# Patient Record
Sex: Male | Born: 1965
Health system: Southern US, Community
[De-identification: ages and names within clinical notes are randomized; demographics above are authoritative.]

## PROBLEM LIST (undated history)

## (undated) DIAGNOSIS — F209 Schizophrenia, unspecified: Secondary | ICD-10-CM

## (undated) DIAGNOSIS — G4730 Sleep apnea, unspecified: Secondary | ICD-10-CM

## (undated) DIAGNOSIS — E1165 Type 2 diabetes mellitus with hyperglycemia: Secondary | ICD-10-CM

## (undated) DIAGNOSIS — N529 Male erectile dysfunction, unspecified: Secondary | ICD-10-CM

## (undated) DIAGNOSIS — I10 Essential (primary) hypertension: Secondary | ICD-10-CM

## (undated) DIAGNOSIS — E785 Hyperlipidemia, unspecified: Secondary | ICD-10-CM

## (undated) DIAGNOSIS — F419 Anxiety disorder, unspecified: Secondary | ICD-10-CM

## (undated) HISTORY — DX: Sleep apnea, unspecified: G47.30

## (undated) HISTORY — DX: Male erectile dysfunction, unspecified: N52.9

## (undated) HISTORY — DX: Type 2 diabetes mellitus with hyperglycemia: E11.65

## (undated) HISTORY — DX: Morbid (severe) obesity due to excess calories: E66.01

## (undated) HISTORY — DX: Hyperlipidemia, unspecified: E78.5

## (undated) HISTORY — DX: Schizophrenia, unspecified: F20.9

## (undated) HISTORY — PX: APPENDECTOMY: SHX54

## (undated) HISTORY — DX: Anxiety disorder, unspecified: F41.9

## (undated) HISTORY — DX: Essential (primary) hypertension: I10

## (undated) HISTORY — PX: ANKLE SURGERY: SHX546

---

## 1999-12-16 ENCOUNTER — Emergency Department (HOSPITAL_COMMUNITY): Admission: EM | Admit: 1999-12-16 | Discharge: 1999-12-16 | Payer: Self-pay | Admitting: Emergency Medicine

## 1999-12-22 ENCOUNTER — Encounter: Admission: RE | Admit: 1999-12-22 | Discharge: 1999-12-22 | Payer: Self-pay | Admitting: Internal Medicine

## 1999-12-22 ENCOUNTER — Emergency Department (HOSPITAL_COMMUNITY): Admission: EM | Admit: 1999-12-22 | Discharge: 1999-12-22 | Payer: Self-pay | Admitting: Emergency Medicine

## 1999-12-26 ENCOUNTER — Emergency Department (HOSPITAL_COMMUNITY): Admission: EM | Admit: 1999-12-26 | Discharge: 1999-12-26 | Payer: Self-pay | Admitting: Emergency Medicine

## 1999-12-31 ENCOUNTER — Emergency Department (HOSPITAL_COMMUNITY): Admission: EM | Admit: 1999-12-31 | Discharge: 1999-12-31 | Payer: Self-pay

## 2000-01-06 ENCOUNTER — Emergency Department (HOSPITAL_COMMUNITY): Admission: EM | Admit: 2000-01-06 | Discharge: 2000-01-06 | Payer: Self-pay

## 2000-02-12 ENCOUNTER — Emergency Department (HOSPITAL_COMMUNITY): Admission: EM | Admit: 2000-02-12 | Discharge: 2000-02-12 | Payer: Self-pay

## 2000-02-21 ENCOUNTER — Emergency Department (HOSPITAL_COMMUNITY): Admission: EM | Admit: 2000-02-21 | Discharge: 2000-02-21 | Payer: Self-pay | Admitting: Emergency Medicine

## 2000-02-22 ENCOUNTER — Encounter: Payer: Self-pay | Admitting: Emergency Medicine

## 2000-02-22 ENCOUNTER — Emergency Department (HOSPITAL_COMMUNITY): Admission: EM | Admit: 2000-02-22 | Discharge: 2000-02-22 | Payer: Self-pay | Admitting: Emergency Medicine

## 2000-02-23 ENCOUNTER — Inpatient Hospital Stay (HOSPITAL_COMMUNITY): Admission: EM | Admit: 2000-02-23 | Discharge: 2000-02-24 | Payer: Self-pay | Admitting: Emergency Medicine

## 2000-02-25 ENCOUNTER — Emergency Department (HOSPITAL_COMMUNITY): Admission: EM | Admit: 2000-02-25 | Discharge: 2000-02-25 | Payer: Self-pay | Admitting: Emergency Medicine

## 2000-02-26 ENCOUNTER — Encounter: Payer: Self-pay | Admitting: Emergency Medicine

## 2000-02-26 ENCOUNTER — Emergency Department (HOSPITAL_COMMUNITY): Admission: EM | Admit: 2000-02-26 | Discharge: 2000-02-26 | Payer: Self-pay | Admitting: Emergency Medicine

## 2000-02-27 ENCOUNTER — Encounter: Payer: Self-pay | Admitting: Emergency Medicine

## 2000-02-27 ENCOUNTER — Emergency Department (HOSPITAL_COMMUNITY): Admission: EM | Admit: 2000-02-27 | Discharge: 2000-02-27 | Payer: Self-pay | Admitting: Emergency Medicine

## 2000-03-04 ENCOUNTER — Ambulatory Visit: Admission: RE | Admit: 2000-03-04 | Discharge: 2000-03-04 | Payer: Self-pay | Admitting: Internal Medicine

## 2000-03-05 ENCOUNTER — Encounter: Payer: Self-pay | Admitting: Emergency Medicine

## 2000-03-05 ENCOUNTER — Emergency Department (HOSPITAL_COMMUNITY): Admission: EM | Admit: 2000-03-05 | Discharge: 2000-03-05 | Payer: Self-pay | Admitting: Emergency Medicine

## 2000-03-09 ENCOUNTER — Encounter: Admission: RE | Admit: 2000-03-09 | Discharge: 2000-03-09 | Payer: Self-pay | Admitting: Internal Medicine

## 2000-03-10 ENCOUNTER — Emergency Department (HOSPITAL_COMMUNITY): Admission: EM | Admit: 2000-03-10 | Discharge: 2000-03-10 | Payer: Self-pay | Admitting: Emergency Medicine

## 2000-03-16 ENCOUNTER — Emergency Department (HOSPITAL_COMMUNITY): Admission: EM | Admit: 2000-03-16 | Discharge: 2000-03-16 | Payer: Self-pay | Admitting: *Deleted

## 2000-03-23 ENCOUNTER — Encounter: Admission: RE | Admit: 2000-03-23 | Discharge: 2000-03-23 | Payer: Self-pay | Admitting: Internal Medicine

## 2000-03-31 ENCOUNTER — Encounter: Admission: RE | Admit: 2000-03-31 | Discharge: 2000-03-31 | Payer: Self-pay | Admitting: Internal Medicine

## 2000-04-12 ENCOUNTER — Emergency Department (HOSPITAL_COMMUNITY): Admission: EM | Admit: 2000-04-12 | Discharge: 2000-04-12 | Payer: Self-pay

## 2000-04-20 ENCOUNTER — Emergency Department (HOSPITAL_COMMUNITY): Admission: EM | Admit: 2000-04-20 | Discharge: 2000-04-20 | Payer: Self-pay | Admitting: Emergency Medicine

## 2000-04-28 ENCOUNTER — Emergency Department (HOSPITAL_COMMUNITY): Admission: EM | Admit: 2000-04-28 | Discharge: 2000-04-28 | Payer: Self-pay | Admitting: Internal Medicine

## 2000-05-10 ENCOUNTER — Emergency Department (HOSPITAL_COMMUNITY): Admission: EM | Admit: 2000-05-10 | Discharge: 2000-05-10 | Payer: Self-pay | Admitting: Emergency Medicine

## 2000-05-28 ENCOUNTER — Emergency Department (HOSPITAL_COMMUNITY): Admission: EM | Admit: 2000-05-28 | Discharge: 2000-05-28 | Payer: Self-pay | Admitting: Emergency Medicine

## 2000-06-02 ENCOUNTER — Encounter: Payer: Self-pay | Admitting: Emergency Medicine

## 2000-06-02 ENCOUNTER — Emergency Department (HOSPITAL_COMMUNITY): Admission: EM | Admit: 2000-06-02 | Discharge: 2000-06-02 | Payer: Self-pay

## 2000-06-28 ENCOUNTER — Emergency Department (HOSPITAL_COMMUNITY): Admission: EM | Admit: 2000-06-28 | Discharge: 2000-06-28 | Payer: Self-pay

## 2000-07-06 ENCOUNTER — Emergency Department (HOSPITAL_COMMUNITY): Admission: EM | Admit: 2000-07-06 | Discharge: 2000-07-06 | Payer: Self-pay | Admitting: Emergency Medicine

## 2000-07-23 ENCOUNTER — Emergency Department (HOSPITAL_COMMUNITY): Admission: EM | Admit: 2000-07-23 | Discharge: 2000-07-23 | Payer: Self-pay

## 2000-07-27 ENCOUNTER — Encounter: Payer: Self-pay | Admitting: Emergency Medicine

## 2000-07-27 ENCOUNTER — Emergency Department (HOSPITAL_COMMUNITY): Admission: EM | Admit: 2000-07-27 | Discharge: 2000-07-27 | Payer: Self-pay | Admitting: Emergency Medicine

## 2000-09-15 ENCOUNTER — Emergency Department (HOSPITAL_COMMUNITY): Admission: EM | Admit: 2000-09-15 | Discharge: 2000-09-15 | Payer: Self-pay | Admitting: Emergency Medicine

## 2001-07-22 ENCOUNTER — Encounter: Admission: RE | Admit: 2001-07-22 | Discharge: 2001-07-22 | Payer: Self-pay | Admitting: *Deleted

## 2001-09-21 ENCOUNTER — Emergency Department (HOSPITAL_COMMUNITY): Admission: EM | Admit: 2001-09-21 | Discharge: 2001-09-21 | Payer: Self-pay | Admitting: Emergency Medicine

## 2002-06-28 ENCOUNTER — Emergency Department (HOSPITAL_COMMUNITY): Admission: EM | Admit: 2002-06-28 | Discharge: 2002-06-28 | Payer: Self-pay | Admitting: Emergency Medicine

## 2002-10-25 ENCOUNTER — Encounter: Payer: Self-pay | Admitting: General Practice

## 2002-10-25 ENCOUNTER — Encounter: Admission: RE | Admit: 2002-10-25 | Discharge: 2002-10-25 | Payer: Self-pay | Admitting: General Practice

## 2002-12-07 ENCOUNTER — Emergency Department (HOSPITAL_COMMUNITY): Admission: EM | Admit: 2002-12-07 | Discharge: 2002-12-07 | Payer: Self-pay | Admitting: Emergency Medicine

## 2002-12-07 ENCOUNTER — Encounter: Payer: Self-pay | Admitting: Emergency Medicine

## 2003-10-23 ENCOUNTER — Emergency Department (HOSPITAL_COMMUNITY): Admission: EM | Admit: 2003-10-23 | Discharge: 2003-10-23 | Payer: Self-pay | Admitting: Emergency Medicine

## 2003-11-20 ENCOUNTER — Emergency Department (HOSPITAL_COMMUNITY): Admission: EM | Admit: 2003-11-20 | Discharge: 2003-11-20 | Payer: Self-pay | Admitting: Emergency Medicine

## 2003-11-27 ENCOUNTER — Encounter: Admission: RE | Admit: 2003-11-27 | Discharge: 2003-11-27 | Payer: Self-pay | Admitting: Internal Medicine

## 2003-11-27 ENCOUNTER — Ambulatory Visit (HOSPITAL_COMMUNITY): Admission: RE | Admit: 2003-11-27 | Discharge: 2003-11-27 | Payer: Self-pay | Admitting: Internal Medicine

## 2003-12-01 ENCOUNTER — Ambulatory Visit (HOSPITAL_BASED_OUTPATIENT_CLINIC_OR_DEPARTMENT_OTHER): Admission: RE | Admit: 2003-12-01 | Discharge: 2003-12-01 | Payer: Self-pay | Admitting: Internal Medicine

## 2003-12-18 ENCOUNTER — Encounter: Admission: RE | Admit: 2003-12-18 | Discharge: 2003-12-18 | Payer: Self-pay | Admitting: Internal Medicine

## 2004-02-18 ENCOUNTER — Emergency Department (HOSPITAL_COMMUNITY): Admission: EM | Admit: 2004-02-18 | Discharge: 2004-02-18 | Payer: Self-pay | Admitting: Emergency Medicine

## 2006-06-18 ENCOUNTER — Emergency Department (HOSPITAL_COMMUNITY): Admission: EM | Admit: 2006-06-18 | Discharge: 2006-06-18 | Payer: Self-pay | Admitting: Emergency Medicine

## 2006-12-03 ENCOUNTER — Emergency Department (HOSPITAL_COMMUNITY): Admission: EM | Admit: 2006-12-03 | Discharge: 2006-12-03 | Payer: Self-pay | Admitting: Emergency Medicine

## 2007-03-02 ENCOUNTER — Emergency Department (HOSPITAL_COMMUNITY): Admission: EM | Admit: 2007-03-02 | Discharge: 2007-03-02 | Payer: Self-pay | Admitting: Family Medicine

## 2007-09-14 ENCOUNTER — Emergency Department (HOSPITAL_COMMUNITY): Admission: EM | Admit: 2007-09-14 | Discharge: 2007-09-14 | Payer: Self-pay | Admitting: Family Medicine

## 2010-01-15 ENCOUNTER — Emergency Department (HOSPITAL_COMMUNITY): Admission: EM | Admit: 2010-01-15 | Discharge: 2010-01-15 | Payer: Self-pay | Admitting: Emergency Medicine

## 2010-01-17 ENCOUNTER — Emergency Department (HOSPITAL_COMMUNITY): Admission: EM | Admit: 2010-01-17 | Discharge: 2010-01-17 | Payer: Self-pay | Admitting: Family Medicine

## 2010-09-28 LAB — GLUCOSE, CAPILLARY: Glucose-Capillary: 108 mg/dL — ABNORMAL HIGH (ref 70–99)

## 2010-09-28 LAB — CULTURE, ROUTINE-ABSCESS

## 2010-11-28 NOTE — Consult Note (Signed)
[place]  Patient:    John [name], John [name]                        MRN: [id number] Proc. Date: 05/28/00 Adm. Date:  [id number] Disc. Date: [id number] Attending:  Cathren Laine Dictator:   [id number] CC:         Wilmon Pali, M.D.   Consultation Report  CHIEF COMPLAINT:  Left arm weakness.  HISTORY OF PRESENT ILLNESS:  John [name] is a 45 year old, right-handed single male, with a history of hypertension and obesity.  Two days prior to his emergency room visit, the patient noticed that his left arm was weak and tremulous.  The impression of weakness seemed fairly subjective, and the arm has been getting stronger for the past couple of days.  The patient denies numbness.  There has been no change in the facial sensation or strength or sensation in his left lower extremity.  Vision has been a little blurred during this time.  He was able to see off both sides, however.  He had not been taking his antihypertensive medication, namely Maxzide, during this time. He did take one baby aspirin today.  The patient recalls experiencing similar symptoms about two months ago. Again, he was not taking his diuretic at that time.  He is generally followed through [facility] in [place].  He tried to get an appointment today, but was referred to the emergency room.  The patient was evaluated by the physicians assistant in the emergency room today.  His blood pressure was found to be elevated.  He has not undergone a CT scan of the brain.  PAST MEDICAL HISTORY:  Otherwise unremarkable except for left foot surgery a number of years ago.  FAMILY HISTORY:  Positive for hypertension and diabetes mellitus.  SOCIAL HISTORY:  The patient is single.  This is otherwise not explored.  PHYSICAL EXAMINATION:  GENERAL:  This is a well-developed, obese, pleasant gentleman, in no acute distress.  VITAL SIGNS:  Blood pressure 163/90, pulse 86 and regular, respirations  18, temperature 98.7 degrees.  HEENT:  Cranium normocephalic, atraumatic.  NECK:  Supple, without bruits.  HEART:  A regular rate and rhythm without murmurs.  LUNGS:  Clear to auscultation.  EXTREMITIES:  Without cyanosis, clubbing, or edema.  There are surgical scars over the left foot.  NEUROLOGIC:  The patient is alert and oriented.  His speech is normal. Cranial nerve examination reveals full extraocular movements without nystagmus.  Pupils equal, reactive to light.  Visual fields are full to confrontation.  Funduscopic examination reveals sharp disk margins.  There is a very slight facial asymmetry which the patient relates to some dental work recently.  Tongue protrudes in the midline.  Motor testing reveals a negative Barres.  There is 5/5 strength throughout the upper and lower extremities. Deep tendon reflexes 1-2+ and symmetric.  Plantar responses are downgoing bilaterally.  Sensory examination is intact to primary modalities.  Cerebellar testing reveals good rapid alternating movements in finger-to-nose.  Gait is normal-based.  The Romberg is negative.  LABORATORY DATA:  Include a 12-lead electrocardiogram which shows normal sinus rhythm, nonspecific T-wave abnormality.  The patient had an i-STAT in the emergency room with all parameters within normal limits.  Glucose 93, creatinine 1.6.  Hemoglobin 17,  hematocrit 49.  IMPRESSION: 1. Question right brain transient ischemic attack/resolving ischemic    neurological defect. 2. Hypertension. 3. Obesity.  RECOMMENDATIONS:  The patient should remain on his  antihypertensive medications and have these adjusted at [facility].  He should also remain on one baby aspirin q.d.  I have recommended that he follow up with [facility] for his general medical care, and follow up with Dr. Candy Sledge for any further neurological problems. DD:  05/28/00 TD:  05/29/00 Job: [id number]

## 2010-11-28 NOTE — Discharge Summary (Signed)
[place]  Patient:    John [name], John [name]                        MRN: [id number] Adm. Date:  [id number] Disc. Date: [id number] Attending:  Lorre Nick Dictator:   Blanch Media, M.D. CC:         [facility]                           Discharge Summary  RESIDENTS: 1. Dr. Cathlean Sauer. 2. Dr. Rogelia Boga.  CONSULTANTS:  [facility].  DISCHARGE DIAGNOSES: 1. Chest pain with EKG changes. 2. Sinusitis.  DISCHARGE MEDICATIONS: 1. HCTZ 25 mg q.d. 2. Aspirin 81 mg q.d. 3. Pepcid 20 mg q.h.s. 4. Afrin nasal spray 2 sprays q.4h. x 3 days. 5. Tylenol p.r.n. 6. Antibiotics as per ER visit.  PROCEDURES:  None.  HISTORY:  This is a 45 year old African-American male who presented to the ER on February 23, 2000, with the complaint of chest pain.  When the patient had been seen in the ER the previous Sunday morning, with the complaint of a three day history of cough, proudctive of green phlegm.  The patient was diagnosed with bronchitis and prescribed a Ventolin inhaler and doxycycline. The patient was discharged from the ER and went home.  The first time he used his Ventolin inhaler he developed symptoms of his heart racing, dizziness, and his left arm was numb.  The patient also had some vague chest discomfort and shortness of breath.  Symptoms lasted for 15 minutes and resolved by themselves. The patient then returned the ER.  The patient only had one other episode of chest discomfort which occurred the day before he was in the ER. It occurred at rest and was described from the patient as indigestion. It was relieved by Tums, and lasted only 5 minutes.  PAST MEDICAL HISTORY: 1. Hypertension which was diagnosed in the Internal Medicine Clinic, on    Nitro-Dur. 2. The patient also had an ankle surgery on the left ankle in 1993.  MEDICATIONS:  HCTZ 10 mg q.d.  SOCIAL HISTORY:  The patient stopped drinking alcohol one month ago.   He denies any tobacco use, although he did use marijuana one month ago.  The patient does not work.  In the past he had been a cook.  The patient is single and lives with his mother in [place].  PHYSICAL EXAMINATION:  VITAL SIGNS:  Blood pressure 182/99, temperature 99.9, pulse 101.  HEART:  Regular rate and rhythm.  CARDIAC:  With no murmurs, rubs, or gallops.  There is no discomfort with sternal pressure.  LUNGS:  Clear to auscultation bilaterally.  HOSPITAL COURSE:  EKG showed a normal sinus rhythm with ST elevation in V2 and inverted T-waves in 2, 3, aVF,  V4 through V6.  Chest x-ray was atelectasis, right greater than left, with no focal infiltrate.  The patient was admitted to telemetry, and serial CK-MBs and troponin-Is were followed, and serial EKGs were also followed.  The patient was started on aspirin.  The patient was admitted to telemetry for a 24 hour observation.  The patient serial cardiac enzymes were negative, although the CK was slightly elevated. The patients lipid profile showed an increased cholesterol of 207, and increased LDL cholesterol at 158.  Cardiology was consulted, and due to the negative cardiac enzymes, an MI was ruled out.  Cardiology recommended an outpatient  Cardiolite test.  DISPOSITION:  The patient was discharged to home on the above medications. The patients Cardiolite test is scheduled for Friday, March 05, 2000, a.m.  The patient was also scheduled for an echocardiogram on March 04, 2000, at [facility].  The echo results were to be faxed to [facility] prior to the Cardiolite test.  The patient was advised not to use the Ventolin inhaler that he had been given in the emergency room.  The patient was to continuing using the antibiotic prescribed by the ER prior to this admission.  The patient was to follow up with the [facility] in two weeks.  The patient was given the telephone number to schedule the appointment  as the clinic was closed at time of discharge. DD:  03/15/00 TD:  03/16/00 Job: [id number]

## 2011-03-21 ENCOUNTER — Inpatient Hospital Stay (INDEPENDENT_AMBULATORY_CARE_PROVIDER_SITE_OTHER)
Admission: RE | Admit: 2011-03-21 | Discharge: 2011-03-21 | Disposition: A | Discharge status: Home / Self Care | Payer: Self-pay | Source: Ambulatory Visit | Attending: Emergency Medicine | Admitting: Emergency Medicine

## 2011-03-21 DIAGNOSIS — L0231 Cutaneous abscess of buttock: Secondary | ICD-10-CM

## 2011-03-24 LAB — CULTURE, ROUTINE-ABSCESS

## 2011-06-28 ENCOUNTER — Encounter: Payer: Self-pay | Admitting: *Deleted

## 2011-06-28 ENCOUNTER — Emergency Department (INDEPENDENT_AMBULATORY_CARE_PROVIDER_SITE_OTHER)
Admission: EM | Admit: 2011-06-28 | Discharge: 2011-06-28 | Disposition: A | Discharge status: Home / Self Care | Payer: Self-pay | Source: Home / Self Care | Attending: Emergency Medicine | Admitting: Emergency Medicine

## 2011-06-28 DIAGNOSIS — K610 Anal abscess: Secondary | ICD-10-CM

## 2011-06-28 DIAGNOSIS — K612 Anorectal abscess: Secondary | ICD-10-CM

## 2011-06-28 MED ORDER — CEPHALEXIN 500 MG PO CAPS: 500.0000 mg | ORAL_CAPSULE | Freq: Three times a day (TID) | ORAL | Status: AC

## 2011-06-28 MED ORDER — CEFTRIAXONE SODIUM 1 G IJ SOLR
1.0000 g | Freq: Once | INTRAMUSCULAR | Status: AC
  Administered 2011-06-28: 1 g via INTRAMUSCULAR

## 2011-06-28 MED ORDER — CEFTRIAXONE SODIUM 1 G IJ SOLR
INTRAMUSCULAR | Status: AC
  Filled 2011-06-28: qty 10

## 2011-06-28 MED ORDER — HYDROCODONE-ACETAMINOPHEN 5-325 MG PO TABS: ORAL_TABLET | ORAL | Status: AC

## 2011-06-28 MED ORDER — LIDOCAINE HCL (PF) 1 % IJ SOLN
INTRAMUSCULAR | Status: AC
  Filled 2011-06-28: qty 5

## 2011-06-28 NOTE — ED Provider Notes (Signed)
History     CSN: [id number] Arrival date & time: 06/28/2011 12:42 PM   First MD Initiated Contact with Patient 06/28/11 1220      Chief Complaint  Patient presents with  . Recurrent Skin Infections    (Consider location/radiation/quality/duration/timing/severity/associated sxs/prior treatment) HPI Comments: One to 2 months ago which he developed a perianal abscess just to the right of the anus. This was lanced and he was begun on doxycycline. It healed up, however he was called back and told that the bacteria was resistant to doxycycline and that he should start Keflex. He never did start the Keflex because the abscess healed up on its own, although over the last 3 or 4 days he's had some pain and drainage in the same area. He denies any fever or chills.   History reviewed. No pertinent past medical history.  Past Surgical History  Procedure Date  . Appendectomy   . Ankle surgery     History reviewed. No pertinent family history.  History  Substance Use Topics  . Smoking status: Never Smoker   . Smokeless tobacco: Not on file  . Alcohol Use: Yes      Review of Systems  Constitutional: Negative for fever and chills.  Skin: Negative for color change, pallor, rash and wound.    Allergies  Aspirin  Home Medications   Current Outpatient Rx  Name Route Sig Dispense Refill  . CEPHALEXIN 500 MG PO CAPS Oral Take 1 capsule (500 mg total) by mouth 3 (three) times daily. 30 capsule 0  . HYDROCODONE-ACETAMINOPHEN 5-325 MG PO TABS  1 to 2 tabs every 4 to 6 hours as needed for pain. 20 tablet 0    BP 167/87  Pulse 106  Temp(Src) 98.6 F (37 C) (Oral)  Resp 18  SpO2 100%  Physical Exam  Nursing note and vitals reviewed. Constitutional: He appears well-developed and well-nourished. No distress.  Skin: Skin is warm and dry. Rash noted. No abrasion, no bruising, no ecchymosis and no lesion noted. He is not diaphoretic. No erythema. No pallor.       In a perianal  Area  and just to the right of the anus there was a small draining area. There was no induration or fluctuance. This had the appearance of a fistula. It was moderately tender to palpation.    ED Course  Procedures (including critical care time)   Labs Reviewed  CULTURE, ROUTINE-ABSCESS   No results found.   1. Perianal abscess       MDM  This could be either an early perianal abscess or a fistula that has developed in the site of a previous perianal abscess. Right now I did obtain a culture again and gave him an injection of Rocephin and cephalexin to take at home. He'll use hot sitz baths. If this isn't healing up over the next week this might be a sign that is a fistula and will need to see a surgeon for excision.        Roque Lias, MD 06/28/11 [phone]

## 2011-06-28 NOTE — ED Notes (Signed)
SEEN HERE IN SEPT FOR ABCESS TO LEFT BUTTOCK, DOXYCYCLINE GIVEN, PT STATES HE WAS TOLD HE NEEDED TO RETURN, BUT DIDN'T, HERE TODAY FOR CO BOIL TO RIGHT BUTTOCK AREA

## 2011-07-01 LAB — CULTURE, ROUTINE-ABSCESS

## 2011-10-27 ENCOUNTER — Emergency Department (INDEPENDENT_AMBULATORY_CARE_PROVIDER_SITE_OTHER)
Admission: EM | Admit: 2011-10-27 | Discharge: 2011-10-27 | Disposition: A | Discharge status: Home / Self Care | Payer: Medicare Other | Source: Home / Self Care | Attending: Emergency Medicine | Admitting: Emergency Medicine

## 2011-10-27 ENCOUNTER — Encounter (HOSPITAL_COMMUNITY): Payer: Self-pay

## 2011-10-27 DIAGNOSIS — K612 Anorectal abscess: Secondary | ICD-10-CM

## 2011-10-27 DIAGNOSIS — K610 Anal abscess: Secondary | ICD-10-CM

## 2011-10-27 MED ORDER — CEPHALEXIN 500 MG PO CAPS: 500.0000 mg | ORAL_CAPSULE | Freq: Three times a day (TID) | ORAL | Status: AC

## 2011-10-27 MED ORDER — BALNEOL EX LOTN: TOPICAL_LOTION | CUTANEOUS | Status: DC

## 2011-10-27 NOTE — Discharge Instructions (Signed)
Peri-Rectal Abscess Your caregiver has diagnosed you as having a peri-rectal abscess. This is an infected area near the rectum that is filled with pus. If the abscess is near the surface of the skin, your caregiver may open (incise) the area and drain the pus. HOME CARE INSTRUCTIONS   If your abscess was opened up and drained. A small piece of gauze may be placed in the opening so that it can drain. Do not remove the gauze unless directed by your caregiver.   A loose dressing may be placed over the abscess site. Change the dressing as often as necessary to keep it clean and dry.   After the drain is removed, the area may be washed with a gentle antiseptic (soap) four times per day.   A warm sitz bath, warm packs or heating pad may be used for pain relief, taking care not to burn yourself.   Return for a wound check in 1 day or as directed.   An "inflatable doughnut" may be used for sitting with added comfort. These can be purchased at a drugstore or medical supply house.   To reduce pain and straining with bowel movements, eat a high fiber diet with plenty of fruits and vegetables. Use stool softeners as recommended by your caregiver. This is especially important if narcotic type pain medications were prescribed as these may cause marked constipation.   Only take over-the-counter or prescription medicines for pain, discomfort, or fever as directed by your caregiver.  SEEK IMMEDIATE MEDICAL CARE IF:   You have increasing pain that is not controlled by medication.   There is increased inflammation (redness), swelling, bleeding, or drainage from the area.   An oral temperature above 102 F (38.9 C) develops.   You develop chills or generalized malaise (feel lethargic or feel "washed out").   You develop any new symptoms (problems) you feel may be related to your present problem.  Document Released: 06/26/2000 Document Revised: 06/18/2011 Document Reviewed: 06/26/2008 [facility] Patient  Information 2012 [place], [place].  Anal Fistula An anal fistula is an abnormal tunnel that leads from the anal canal (which carries stool from the large intestine) to a hole in the skin near the anus (the opening through which stool passes out of your body).  CAUSES  Food you eat goes from your stomach into your intestine. As the food is digested, waste material (stool) forms. Stool passes through your large intestine, through the rectum and anal canal, and out of your body through the anus.  The anus has a number of tiny glands (clusters of specialized cells) that make lubricating fluid. Sometimes these glands can become infected. This type of infection may lead to the development of a pocket of pus (abscess). An anal fistula often develops after an infection or abscess; It is nearly always caused by a past anorectal abscess. You are at a higher risk of developing an anal fistula if you have:  Had an anal abscess.   Chronic inflammatory bowel disease, such as Crohn's disease or ulcerative colitis.   Conditions in which there are inflamed outpouchings of the intestinal wall (diverticulitis).   Colon or rectal cancer.   Sexually transmitted diseases involving the rectum, such as gonorrhea or chlamydia.   A history of anal radiation treatments, injury, or surgery.   An HIV infection.   A problem that has required treatment with steroid medicines for more than a short time.  SYMPTOMS   Anal pain, particularly around the area of a past abscess.  Drainage of pus, blood, stool or mucus from an opening in the skin.   Swelling around the skin opening.   Worn off skin around the opening.   A hot or red area near the anus.   Diarrhea.   Fever and chills.   Tiredness (fatigue).  DIAGNOSIS   In some cases, the opening of an anal fistula is easily seen during a physical exam.   A probe or scope may be used to help locate the opening of the fistula. In some cases, dye can be injected  into the fistula opening, and X-rays can be taken to find the exact location and path of the fistula.   A sample (biopsy) of the fistula tissue or anus may be taken to check for cancer.  TREATMENT   An anal fistula may need surgery to open it up and allow it to heal. This type of operation is called a fistulotomy.   A specialized kind of glue or plug to seal the fistula may be used.   An antibiotic may be prescribed to treat an existing infection.  HOME CARE INSTRUCTIONS   Take medications (such as antibiotics) as prescribed by your caregiver.   Only take over-the-counter or prescription medicine for pain, discomfort, or fever as directed by your caregiver.   Follow your prescribed diet. You may need a higher fiber diet to help avoid constipation.   Drink lots of water as directed.   Use a stool softener or laxative, if recommended.   A warm sitz bath several times a day may be soothing, as well as help with healing.   Follow excellent hygiene to keep the anal area as clean as possible. Consider using pre-moistened towelettes to keep the anal area clean after using the bathroom.  SEEK MEDICAL CARE IF:  You have increased pain not controlled with medications.   You notice new swelling, redness, or hotness in the anal area.   You develop any problems passing urine.   You develop a fever (more than 100.5 F (38.1 C).  SEEK IMMEDIATE MEDICAL CARE IF:  You have severe, intolerable pain.   You have severe problems passing urine or cannot pass any urine at all.   You develop an unexplained oral temperature above 102.0 F (38.9 C).   You notice new or worsening leakage of blood, pus, mucus, or stool.  Document Released: 06/11/2008 Document Revised: 06/18/2011 Document Reviewed: 06/11/2008 [facility] Patient Information 2012 [place], [place].

## 2011-10-27 NOTE — ED Notes (Signed)
C/o perianal abscess for 2-3 days.  States it is draining.  REports has had this before and was seen here for it.

## 2011-10-27 NOTE — ED Provider Notes (Signed)
Chief Complaint  Patient presents with  . Abscess    History of Present Illness:   John [name] is a 46 year old male who comes in today for an abscess on his right buttock near his anus. He was seen here twice for the same thing back in December, 4 months ago. The abscess was drained and he was placed on doxycycline. He came back for medication change since the organism was resistant to doxycycline and he was switched to cephalexin instead. The lesion healed up until last week when this again becomes swollen and tender. Yesterday it drained a small amount of yellowish pus, and he says it now feels better. He denies any fever or chills.  Review of Systems:  Other than noted above, the patient denies any of the following symptoms: Systemic:  No fever, chills or sweats. Skin:  No rash or itching.  PMFSH:  Past medical history, family history, social history, meds, and allergies were reviewed.  Physical Exam:   Vital signs:  BP 147/94  Pulse 106  Temp(Src) 97.6 F (36.4 C) (Oral)  Resp 18  SpO2 97% Skin:  Just to the right of the anus there is a small draining sinus tract. This was not draining any pus it was mildly tender to palpation. There is no induration or fluctuance. No other lesions were seen.  Otherwise normal.  No rash.  Assessment:  The encounter diagnosis was Perianal abscess. I think this could be either a recurring perianal abscess or possibly a fistula in ano. We'll treat right now with antibiotics and a cleansing cream. If no better in 2 weeks I suggested he may want to come back for referral to a surgeon for definitive treatment.  Plan:   1.  The following meds were prescribed:   New Prescriptions   CEPHALEXIN (KEFLEX) 500 MG CAPSULE    Take 1 capsule (500 mg total) by mouth 3 (three) times daily.   INCONTINENT WASH (BALNEOL) LOTN    Apply externally TID.   2.  The patient was instructed in symptomatic care and handouts were given. 3.  The patient was instructed to leave the  dressing in place and return again in 48 hours for packing removal.   Reuben Likes, MD 10/27/11 [phone]

## 2012-04-14 ENCOUNTER — Encounter (HOSPITAL_COMMUNITY): Payer: Self-pay | Admitting: Emergency Medicine

## 2012-04-14 ENCOUNTER — Emergency Department (INDEPENDENT_AMBULATORY_CARE_PROVIDER_SITE_OTHER)
Admission: EM | Admit: 2012-04-14 | Discharge: 2012-04-14 | Disposition: A | Discharge status: Home / Self Care | Payer: Medicare Other | Source: Home / Self Care | Attending: Emergency Medicine | Admitting: Emergency Medicine

## 2012-04-14 ENCOUNTER — Emergency Department (INDEPENDENT_AMBULATORY_CARE_PROVIDER_SITE_OTHER): Payer: Medicare Other

## 2012-04-14 DIAGNOSIS — M25559 Pain in unspecified hip: Secondary | ICD-10-CM

## 2012-04-14 DIAGNOSIS — M25569 Pain in unspecified knee: Secondary | ICD-10-CM

## 2012-04-14 DIAGNOSIS — M25551 Pain in right hip: Secondary | ICD-10-CM

## 2012-04-14 MED ORDER — HYDROCODONE-IBUPROFEN 7.5-200 MG PO TABS: 1.0000 | ORAL_TABLET | Freq: Three times a day (TID) | ORAL | Status: DC | PRN

## 2012-04-14 NOTE — ED Provider Notes (Signed)
History     CSN: [id number]  Arrival date & time 04/14/12  1745   First MD Initiated Contact with Patient 04/14/12 1747      Chief Complaint  Patient presents with  . Knee Pain    (Consider location/radiation/quality/duration/timing/severity/associated sxs/prior treatment) HPI Comments: Patient presents urgent care tonight complaining of a 2 day history of pain that originates on his right thigh region radiating down to his right knee. Patient describes just woke up with this discomfort the pain does not remember sustaining any injuries, sprain strain his or recent falls. Patient does suffer from right knee pain as he has a sore throat is of his knee. Has been taking Motrin and frontal horns pain. Describes hurts the most when he walks on his right hip area. (Patient points to lateral aspect of right thigh). Patient denies any numbness, tingling sensation or distal weakness on his right lower extremity.   Patient is a 46 y.o. male presenting with knee pain. The history is provided by the patient.  Knee Pain This is a new problem. The current episode started 2 days ago. The problem occurs constantly. The problem has been gradually worsening. Pertinent negatives include no chest pain, no abdominal pain, no headaches and no shortness of breath. The symptoms are aggravated by walking. Nothing relieves the symptoms. He has tried nothing for the symptoms.    History reviewed. No pertinent past medical history.  Past Surgical History  Procedure Date  . Appendectomy   . Ankle surgery     History reviewed. No pertinent family history.  History  Substance Use Topics  . Smoking status: Never Smoker   . Smokeless tobacco: Not on file  . Alcohol Use: Yes      Review of Systems  Constitutional: Negative for fever, chills, activity change, appetite change and fatigue.  HENT: Negative for neck pain and neck stiffness.   Respiratory: Negative for shortness of breath.   Cardiovascular:  Negative for chest pain.  Gastrointestinal: Negative for vomiting, abdominal pain and rectal pain.  Skin: Negative for pallor, rash and wound.  Neurological: Negative for weakness, numbness and headaches.    Allergies  Aspirin  Home Medications   Current Outpatient Rx  Name Route Sig Dispense Refill  . BALNEOL EX LOTN  Apply externally TID. 89 mL 0    BP 165/92  Pulse 90  Temp 98.6 F (37 C) (Oral)  Resp 18  SpO2 100%  Physical Exam  Nursing note and vitals reviewed. Constitutional: Vital signs are normal. He is active.  Non-toxic appearance. He does not have a sickly appearance. He does not appear ill. No distress.  Pulmonary/Chest: Effort normal and breath sounds normal. No apnea. No respiratory distress. He has no decreased breath sounds. He has no wheezes.  Abdominal: He exhibits no distension. There is no tenderness. There is no rebound and no guarding.  Musculoskeletal: He exhibits tenderness. He exhibits no edema.       Legs: Neurological: He is alert.  Skin: Skin is warm. No erythema.    ED Course  Procedures (including critical care time)  Labs Reviewed - No data to display No results found.   No diagnosis found.    MDM          Jimmie Molly, MD 04/14/12 [id number]

## 2012-04-14 NOTE — ED Notes (Signed)
Pt c/o sharp pain through out right thigh and knee x 2 days, pt states he woke up with this pain. Pt denies injury. Has tried ibuprofen and tramadol with no relief of pain.

## 2012-04-14 NOTE — ED Notes (Signed)
Waiting for discharge papers

## 2012-11-08 ENCOUNTER — Emergency Department (HOSPITAL_COMMUNITY)
Admission: EM | Admit: 2012-11-08 | Discharge: 2012-11-08 | Disposition: A | Discharge status: Home / Self Care | Payer: Medicare Other | Attending: Emergency Medicine | Admitting: Emergency Medicine

## 2012-11-08 ENCOUNTER — Encounter (HOSPITAL_COMMUNITY): Payer: Self-pay | Admitting: *Deleted

## 2012-11-08 ENCOUNTER — Emergency Department (INDEPENDENT_AMBULATORY_CARE_PROVIDER_SITE_OTHER)
Admission: EM | Admit: 2012-11-08 | Discharge: 2012-11-08 | Disposition: A | Discharge status: Nursing Home (Includes ICF) | Payer: Medicare Other | Source: Home / Self Care | Attending: Family Medicine | Admitting: Family Medicine

## 2012-11-08 ENCOUNTER — Ambulatory Visit (INDEPENDENT_AMBULATORY_CARE_PROVIDER_SITE_OTHER): Payer: Medicare Other | Admitting: General Surgery

## 2012-11-08 ENCOUNTER — Encounter (HOSPITAL_COMMUNITY): Payer: Self-pay | Admitting: Emergency Medicine

## 2012-11-08 DIAGNOSIS — K429 Umbilical hernia without obstruction or gangrene: Secondary | ICD-10-CM | POA: Insufficient documentation

## 2012-11-08 DIAGNOSIS — K612 Anorectal abscess: Secondary | ICD-10-CM | POA: Insufficient documentation

## 2012-11-08 DIAGNOSIS — K420 Umbilical hernia with obstruction, without gangrene: Secondary | ICD-10-CM

## 2012-11-08 DIAGNOSIS — E669 Obesity, unspecified: Secondary | ICD-10-CM | POA: Insufficient documentation

## 2012-11-08 LAB — CBC WITH DIFFERENTIAL/PLATELET
Eosinophils Absolute: 0.2 10*3/uL (ref 0.0–0.7)
Hemoglobin: 13.2 g/dL (ref 13.0–17.0)
Lymphocytes Relative: 28 % (ref 12–46)
Lymphs Abs: 2.9 10*3/uL (ref 0.7–4.0)
Monocytes Relative: 6 % (ref 3–12)
Neutro Abs: 6.7 10*3/uL (ref 1.7–7.7)
Neutrophils Relative %: 64 % (ref 43–77)
Platelets: 282 10*3/uL (ref 150–400)
RBC: 4.54 MIL/uL (ref 4.22–5.81)
WBC: 10.3 10*3/uL (ref 4.0–10.5)

## 2012-11-08 LAB — COMPREHENSIVE METABOLIC PANEL
ALT: 19 U/L (ref 0–53)
Alkaline Phosphatase: 54 U/L (ref 39–117)
BUN: 12 mg/dL (ref 6–23)
CO2: 24 mEq/L (ref 19–32)
Chloride: 101 mEq/L (ref 96–112)
GFR calc Af Amer: >90 mL/min (ref >90)
Glucose, Bld: 109 mg/dL — ABNORMAL HIGH (ref 70–99)
Potassium: 3.5 mEq/L (ref 3.5–5.1)
Sodium: 137 mEq/L (ref 135–145)
Total Bilirubin: 0.2 mg/dL — ABNORMAL LOW (ref 0.3–1.2)
Total Protein: 8.7 g/dL — ABNORMAL HIGH (ref 6.0–8.3)

## 2012-11-08 MED ORDER — HYDROCODONE-ACETAMINOPHEN 5-325 MG PO TABS: 2.0000 | ORAL_TABLET | ORAL | Status: DC | PRN

## 2012-11-08 NOTE — ED Provider Notes (Signed)
Morbidly obese 47 year old black male with a history of umbilical hernia states that it has been getting larger and it has been getting and painful. He also has a knot on his scrotum which has been present for the last several days. That is actually improving. He was seen at urgent care where he was diagnosed with an incarcerated umbilical hernia and sent here. CBC at urgent care was normal. On exam, abdomen is morbidly obese. He has a large umbilical hernia which is tender but very soft. I am able to reduce it although it does recur. There is no indication for emergent surgical repair. Even were incarcerated, umbilical hernias are most commonly incarcerated with omentum and rarely require emergent surgery. The lesion on his scrotum appears to be an abscess in the deep part of the scrotal wall. He will be referred to [facility] [place] [facility] for discussion about appropriate management of umbilical hernia and consideration for surgery. He will be referred to urology for evaluation of the scrotal abscess.  Medical screening examination/treatment/procedure(s) were conducted as a shared visit with non-physician practitioner(s) and myself.  I personally evaluated the patient during the encounter   Dione Booze, MD 11/08/12 [phone]

## 2012-11-08 NOTE — ED Notes (Signed)
Pt also report an abcess near his anus. Pt states that it is draining clear with occasional blood.

## 2012-11-08 NOTE — ED Notes (Signed)
Pt sent here for eval for umbilical hernia with increased pain; pt sts draining anal abscess with hx of unfixed fissure

## 2012-11-08 NOTE — ED Notes (Signed)
Pt  Has  Two  Issues    He  Has  A   Hernia  Which  She  Has  Had  In  Past  Near  Umbilical  Area       Which  He  Reports  Is  Becoming  painfull  He  Also  Reports  Draining       painfull  Peri  Anal  Area   For  sev  Days  He  Says  He  Has  An  Anal fissure  Which  He  Never  Got  Fixed     He  States  He  Has  Medicaid  But  No  Provider on the  Card

## 2012-11-08 NOTE — ED Provider Notes (Signed)
History     CSN: [id number]  Arrival date & time 11/08/12  1654   First MD Initiated Contact with Patient 11/08/12 1745      Chief Complaint  Patient presents with  . Abscess  . Hernia    (Consider location/radiation/quality/duration/timing/severity/associated sxs/prior treatment) HPI Comments: Patient is a 47 year old male with a past medical history of umbilical hernia who presents with abdominal pain for the past 2 days. The pain is located at his umbilical hernia and radiates to periumbilical area. The pain is described as sharp and severe. The pain started gradually and progressively worsened since the onset. No alleviating/aggravating factors. The patient has tried nothing for symptoms without relief. Associated symptoms include nothing. Patient denies fever, headache, NVD, chest pain, SOB, dysuria, constipation.    Patient is a 47 y.o. male presenting with abscess.  Abscess   History reviewed. No pertinent past medical history.  Past Surgical History  Procedure Laterality Date  . Appendectomy    . Ankle surgery      History reviewed. No pertinent family history.  History  Substance Use Topics  . Smoking status: Never Smoker   . Smokeless tobacco: Not on file  . Alcohol Use: Yes      Review of Systems  Gastrointestinal: Positive for abdominal pain.  All other systems reviewed and are negative.    Allergies  Aspirin  Home Medications  No current outpatient prescriptions on file.  BP 159/97  Pulse 101  Temp(Src) 98.5 F (36.9 C) (Oral)  Resp 18  SpO2 99%  Physical Exam  Nursing note and vitals reviewed. Constitutional: He appears well-developed and well-nourished. No distress.  HENT:  Head: Normocephalic and atraumatic.  Eyes: Conjunctivae are normal.  Neck: Normal range of motion.  Cardiovascular: Normal rate and regular rhythm.  Exam reveals no gallop and no friction rub.   No murmur heard. Pulmonary/Chest: Effort normal and breath sounds  normal. He has no wheezes. He has no rales. He exhibits no tenderness.  Abdominal: Soft. He exhibits no distension. There is tenderness. There is no rebound and no guarding.  Obese abdomen with prominent umbilical hernia with mild tenderness to palpation.   Musculoskeletal: Normal range of motion.  Neurological: He is alert. Coordination normal.  Speech is goal-oriented. Moves limbs without ataxia.   Skin: Skin is warm and dry.  Psychiatric: He has a normal mood and affect. His behavior is normal.    ED Course  Procedures (including critical care time)  Labs Reviewed  COMPREHENSIVE METABOLIC PANEL - Abnormal; Notable for the following:    Glucose, Bld 109 (*)    Total Protein 8.7 (*)    Total Bilirubin 0.2 (*)    All other components within normal limits  CBC WITH DIFFERENTIAL   No results found.   1. Umbilical hernia       MDM  6:06 PM Patient declines pain medication at this time. Dr. Preston Fleeting was able to almost completely reduce the patient's hernia.  6:12 PM Labs unremarkable. Patient will be discharged with pain medication and instructions to follow up with [facility]rology and [facility] surgery. Vitals stable and patient afebrile. Patient instructed to return with worsening or concerning symptoms.       Emilia Beck, PA-C 11/08/12 1816

## 2012-11-08 NOTE — ED Provider Notes (Signed)
History     CSN: [id number]  Arrival date & time 11/08/12  1501   First MD Initiated Contact with Patient 11/08/12 1617      Chief Complaint  Patient presents with  . Hernia    (Consider location/radiation/quality/duration/timing/severity/associated sxs/prior treatment) Patient is a 47 y.o. male presenting with abdominal pain.  Abdominal Pain Pain location:  Periumbilical Pain radiates to:  Periumbilical region and scrotum Pain severity:  Moderate Duration:  2 days Progression:  Worsening   History reviewed. No pertinent past medical history.  Past Surgical History  Procedure Laterality Date  . Appendectomy    . Ankle surgery      History reviewed. No pertinent family history.  History  Substance Use Topics  . Smoking status: Never Smoker   . Smokeless tobacco: Not on file  . Alcohol Use: Yes      Review of Systems  Constitutional: Negative.   Gastrointestinal: Positive for abdominal pain.  Genitourinary: Positive for scrotal swelling.    Allergies  Aspirin  Home Medications   Current Outpatient Rx  Name  Route  Sig  Dispense  Refill  . HYDROcodone-ibuprofen (VICOPROFEN) 7.5-200 MG per tablet   Oral   Take 1 tablet by mouth every 8 (eight) hours as needed for pain.   15 tablet   0   . Incontinent Wash (BALNEOL) LOTN      Apply externally TID.   89 mL   0     BP 119/85  Pulse 100  Temp(Src) 98.1 F (36.7 C) (Oral)  Resp 18  SpO2 100%  Physical Exam  Nursing note and vitals reviewed. Constitutional: He is oriented to person, place, and time. He appears well-developed and well-nourished.  Abdominal: Soft. Bowel sounds are normal. He exhibits mass.  4cm tender umbilical hernia , also indurated mass at base of scrotum extending to perirectal area with draining fistula site  Neurological: He is alert and oriented to person, place, and time.  Skin: Skin is warm and dry.    ED Course  Procedures (including critical care time)  Labs  Reviewed - No data to display No results found.   1. Incarcerated umbilical hernia       MDM  Sent for incarcerated umbilical hernia and scrotal abscess.        Linna Hoff, MD 11/08/12 [phone]

## 2012-11-09 NOTE — ED Notes (Signed)
Patient called, inquiring as to why he did not get a Rx for antibiotic when in the ED. Was advised we cannot Rx for a ED visit

## 2012-11-09 NOTE — ED Notes (Signed)
Chart review.

## 2012-11-09 NOTE — ED Notes (Signed)
Patient called with question about abx medication.

## 2012-11-24 ENCOUNTER — Ambulatory Visit (INDEPENDENT_AMBULATORY_CARE_PROVIDER_SITE_OTHER): Payer: Medicare Other | Admitting: General Surgery

## 2012-12-02 ENCOUNTER — Other Ambulatory Visit: Payer: Self-pay | Admitting: Urology

## 2012-12-08 ENCOUNTER — Ambulatory Visit (INDEPENDENT_AMBULATORY_CARE_PROVIDER_SITE_OTHER): Payer: Medicare Other | Admitting: General Surgery

## 2012-12-22 ENCOUNTER — Encounter (HOSPITAL_COMMUNITY): Payer: Self-pay | Admitting: Pharmacy Technician

## 2012-12-23 ENCOUNTER — Other Ambulatory Visit (HOSPITAL_COMMUNITY): Payer: Medicare Other

## 2012-12-27 ENCOUNTER — Inpatient Hospital Stay (HOSPITAL_COMMUNITY)
Admission: RE | Admit: 2012-12-27 | Discharge: 2012-12-27 | Disposition: A | Discharge status: Home / Self Care | Payer: Medicare Other | Source: Ambulatory Visit

## 2012-12-27 NOTE — Patient Instructions (Signed)
[address]ER[address]  12/27/2012   Your procedure is scheduled on:    Report to [facility] at       AM.  Call this number if you have problems the morning of surgery: [phone]       Remember:   Do not eat food  Or drink :After Midnight.   Take these medicines the morning of surgery with A SIP OF WATER:   .  Contacts, dentures or partial plates can not be worn to surgery  Leave suitcase in the car. After surgery it may be brought to your room.  For patients admitted to the hospital, checkout time is 11:00 AM day of  discharge.             SPECIAL INSTRUCTIONS- SEE [place] PREPARING FOR SURGERY INSTRUCTION SHEET-     DO NOT WEAR JEWELRY, LOTIONS, POWDERS, OR PERFUMES.  WOMEN-- DO NOT SHAVE LEGS OR UNDERARMS FOR 12 HOURS BEFORE SHOWERS. MEN MAY SHAVE FACE.  Patients discharged the day of surgery will not be allowed to drive home. IF going home the day of surgery, you must have a driver and someone to stay with you for the first 24 hours  Name and phone number of your driver:                                                                        Please read over the following fact sheets that you were given: MRSA Information, Incentive Spirometry Sheet, Blood Transfusion Sheet  Information                                                                                                    FAILURE TO FOLLOW THESE INSTRUCTIONS MAY RESULT IN  CANCELLATION   OF YOUR SURGERY                                                  Patient Signature _____________________________

## 2013-01-03 ENCOUNTER — Ambulatory Visit (HOSPITAL_COMMUNITY): Admission: RE | Admit: 2013-01-03 | Payer: Medicare Other | Source: Ambulatory Visit | Admitting: Urology

## 2013-01-03 ENCOUNTER — Encounter (HOSPITAL_COMMUNITY): Admission: RE | Payer: Self-pay | Source: Ambulatory Visit

## 2013-01-03 SURGERY — EXPLORATION, SCROTUM
Anesthesia: General

## 2013-01-12 ENCOUNTER — Ambulatory Visit (INDEPENDENT_AMBULATORY_CARE_PROVIDER_SITE_OTHER): Payer: Medicare Other | Admitting: General Surgery

## 2013-02-02 ENCOUNTER — Ambulatory Visit (INDEPENDENT_AMBULATORY_CARE_PROVIDER_SITE_OTHER): Payer: Medicare Other | Admitting: General Surgery

## 2013-02-10 ENCOUNTER — Ambulatory Visit (INDEPENDENT_AMBULATORY_CARE_PROVIDER_SITE_OTHER): Payer: Medicare Other | Admitting: General Surgery

## 2013-03-01 ENCOUNTER — Ambulatory Visit (INDEPENDENT_AMBULATORY_CARE_PROVIDER_SITE_OTHER): Payer: Medicare Other | Admitting: General Surgery

## 2013-03-16 ENCOUNTER — Ambulatory Visit (INDEPENDENT_AMBULATORY_CARE_PROVIDER_SITE_OTHER): Payer: Medicare Other | Admitting: General Surgery

## 2013-04-14 ENCOUNTER — Ambulatory Visit (INDEPENDENT_AMBULATORY_CARE_PROVIDER_SITE_OTHER): Payer: Medicare Other | Admitting: General Surgery

## 2013-04-27 ENCOUNTER — Ambulatory Visit (INDEPENDENT_AMBULATORY_CARE_PROVIDER_SITE_OTHER): Payer: Medicare Other | Admitting: General Surgery

## 2013-05-01 ENCOUNTER — Encounter (INDEPENDENT_AMBULATORY_CARE_PROVIDER_SITE_OTHER): Payer: Self-pay | Admitting: General Surgery

## 2013-08-17 ENCOUNTER — Ambulatory Visit (INDEPENDENT_AMBULATORY_CARE_PROVIDER_SITE_OTHER): Payer: Medicare Other | Admitting: Pulmonary Disease

## 2013-08-17 ENCOUNTER — Encounter: Payer: Self-pay | Admitting: Pulmonary Disease

## 2013-08-17 VITALS — BP 142/90 | HR 106 | Ht 72.0 in | Wt >= 6400 oz

## 2013-08-17 DIAGNOSIS — G4733 Obstructive sleep apnea (adult) (pediatric): Secondary | ICD-10-CM

## 2013-08-17 DIAGNOSIS — I10 Essential (primary) hypertension: Secondary | ICD-10-CM

## 2013-08-17 NOTE — Patient Instructions (Signed)
Will arrange for referral to nutrition specialist Will arrange for CPAP titration study Will call to arrange for follow up after sleep study reviewed

## 2013-08-17 NOTE — Progress Notes (Signed)
Chief Complaint  Patient presents with  . Sleep Consult    Epworth Score: 24.    History of Present Illness: John [name] is a 48 y.o. male for evaluation of sleep problems.  He has a history of sleep apnea.  He had a sleep study years ago.  He was told he had severe sleep apnea.  He was previously on CPAP and oxygen.  After he was separated from his wife he no longer had access to his oxygen set up.  He continues to use CPAP.  He bought an older CPAP model [facility] a year ago [facility]  He uses a nasal mask.  When he uses his CPAP he does not snore.  If he does not use his CPAP, then he snores very loud.  He will also wake up choking, and has been told he stops breathing.  He goes to sleep at 12 am.  He falls asleep quickly.  He wakes up frequently during the night.  He gets out of bed at 6 am.  He feels tired in the morning.  He occasionally gets morning headache.  He does not use anything to help him fall sleep or stay awake.  He denies sleep walking, sleep talking, bruxism, or nightmares.  There is no history of restless legs.  He denies sleep hallucinations, sleep paralysis, or cataplexy.  The Epworth score is 24 out of 24.  He is on disability due to apnea and depression.  He is in the process of setting up a primary care physician.    John BaxterRicky D Georg  has a past medical history of Hypertension and Sleep apnea.  John BaxterRicky D Hubbert  has past surgical history that includes Appendectomy and Ankle surgery.  Prior to Admission medications   Not on File    Allergies  Allergen Reactions  . Aspirin Nausea Only    His family history is not on file.  He  reports that he has never smoked. He does not have any smokeless tobacco history on file. He reports that he drinks alcohol. He reports that he does not use illicit drugs.  Review of Systems  Constitutional: Negative for fever, chills, diaphoresis, activity change, appetite change, fatigue and unexpected weight change.  HENT: Negative for  congestion, dental problem, ear discharge, ear pain, facial swelling, hearing loss, mouth sores, nosebleeds, postnasal drip, rhinorrhea, sinus pressure, sneezing, sore throat, tinnitus, trouble swallowing and voice change.   Eyes: Negative for photophobia, discharge, itching and visual disturbance.  Respiratory: Negative for apnea, cough, choking, chest tightness, shortness of breath, wheezing and stridor.   Cardiovascular: Negative for chest pain, palpitations and leg swelling.  Gastrointestinal: Negative for nausea, vomiting, abdominal pain, constipation, blood in stool and abdominal distention.       Heartburn  Genitourinary: Negative for dysuria, urgency, frequency, hematuria, flank pain, decreased urine volume and difficulty urinating.  Musculoskeletal: Negative for arthralgias, back pain, gait problem, joint swelling, myalgias, neck pain and neck stiffness.  Skin: Negative for color change, pallor and rash.  Neurological: Negative for dizziness, tremors, seizures, syncope, speech difficulty, weakness, light-headedness, numbness and headaches.  Hematological: Negative for adenopathy. Does not bruise/bleed easily.  Psychiatric/Behavioral: Positive for dysphoric mood. Negative for confusion, sleep disturbance and agitation. The patient is not nervous/anxious.    Physical Exam:  General - No distress ENT - No sinus tenderness, no oral exudate, no LAN, no thyromegaly, TM clear, pupils equal/reactive, enlarged tongue, MP 4 Cardiac - s1s2 regular, no murmur, pulses symmetric Chest - No  wheeze/rales/dullness, good air entry, normal respiratory excursion Back - No focal tenderness Abd - Soft, non-tender, no organomegaly, + bowel sounds Ext - 1+ non pitting lower extremity edema Neuro - Normal strength, cranial nerves intact Skin - No rashes Psych - Normal mood, and behavior  Assessment/plan:  Coralyn Helling, MD [facility] Pulmonary/Critical Care 08/17/2013, 4:15 PM Pager:  [phone] After 3pm  call: [phone]

## 2013-08-17 NOTE — Progress Notes (Deleted)
   Subjective:    Patient ID: John BaxterRicky D Iglehart, male    DOB: October 03, 1965, 48 y.o.   MRN: [id number]  HPI    Review of Systems  Constitutional: Negative for fever, chills, diaphoresis, activity change, appetite change, fatigue and unexpected weight change.  HENT: Negative for congestion, dental problem, ear discharge, ear pain, facial swelling, hearing loss, mouth sores, nosebleeds, postnasal drip, rhinorrhea, sinus pressure, sneezing, sore throat, tinnitus, trouble swallowing and voice change.   Eyes: Negative for photophobia, discharge, itching and visual disturbance.  Respiratory: Negative for apnea, cough, choking, chest tightness, shortness of breath, wheezing and stridor.   Cardiovascular: Negative for chest pain, palpitations and leg swelling.  Gastrointestinal: Negative for nausea, vomiting, abdominal pain, constipation, blood in stool and abdominal distention.       Heartburn  Genitourinary: Negative for dysuria, urgency, frequency, hematuria, flank pain, decreased urine volume and difficulty urinating.  Musculoskeletal: Negative for arthralgias, back pain, gait problem, joint swelling, myalgias, neck pain and neck stiffness.  Skin: Negative for color change, pallor and rash.  Neurological: Negative for dizziness, tremors, seizures, syncope, speech difficulty, weakness, light-headedness, numbness and headaches.  Hematological: Negative for adenopathy. Does not bruise/bleed easily.  Psychiatric/Behavioral: Positive for dysphoric mood. Negative for confusion, sleep disturbance and agitation. The patient is not nervous/anxious.        Objective:   Physical Exam        Assessment & Plan:

## 2013-08-17 NOTE — Assessment & Plan Note (Signed)
He has history of severe sleep apnea.  He likely also has component of hypoventilation.  He has been on CPAP therapy, but this main not be sufficient for his needs.  Will arrange for in lab titration study to further assess optimal set up of CPAP vs BiPAP vs ASV +/- supplemental oxygen.

## 2013-08-17 NOTE — Assessment & Plan Note (Signed)
He is in the process of arranging for primary care physician.

## 2013-08-17 NOTE — Assessment & Plan Note (Signed)
Discussed importance of weight loss.  Will arrange for referral to nutritionist to assess his diet and make modifications to assist with weight loss.

## 2013-09-17 ENCOUNTER — Ambulatory Visit (HOSPITAL_BASED_OUTPATIENT_CLINIC_OR_DEPARTMENT_OTHER): Payer: Medicare Other | Attending: Pulmonary Disease

## 2013-09-28 ENCOUNTER — Ambulatory Visit (HOSPITAL_BASED_OUTPATIENT_CLINIC_OR_DEPARTMENT_OTHER): Payer: Medicare Other | Attending: Pulmonary Disease

## 2013-10-05 ENCOUNTER — Ambulatory Visit: Payer: Medicare Other | Admitting: Dietician

## 2014-04-06 ENCOUNTER — Telehealth: Payer: Self-pay | Admitting: Pulmonary Disease

## 2014-04-06 IMAGING — CR DG HIP COMPLETE 2+V*R*
3 series · 3 of 3 positions shown · non-contrast
Comparison: None.

CLINICAL DATA: Hip pain, no known injury

RIGHT HIP - COMPLETE 2+ VIEW

[view not recorded (1 of 3)]
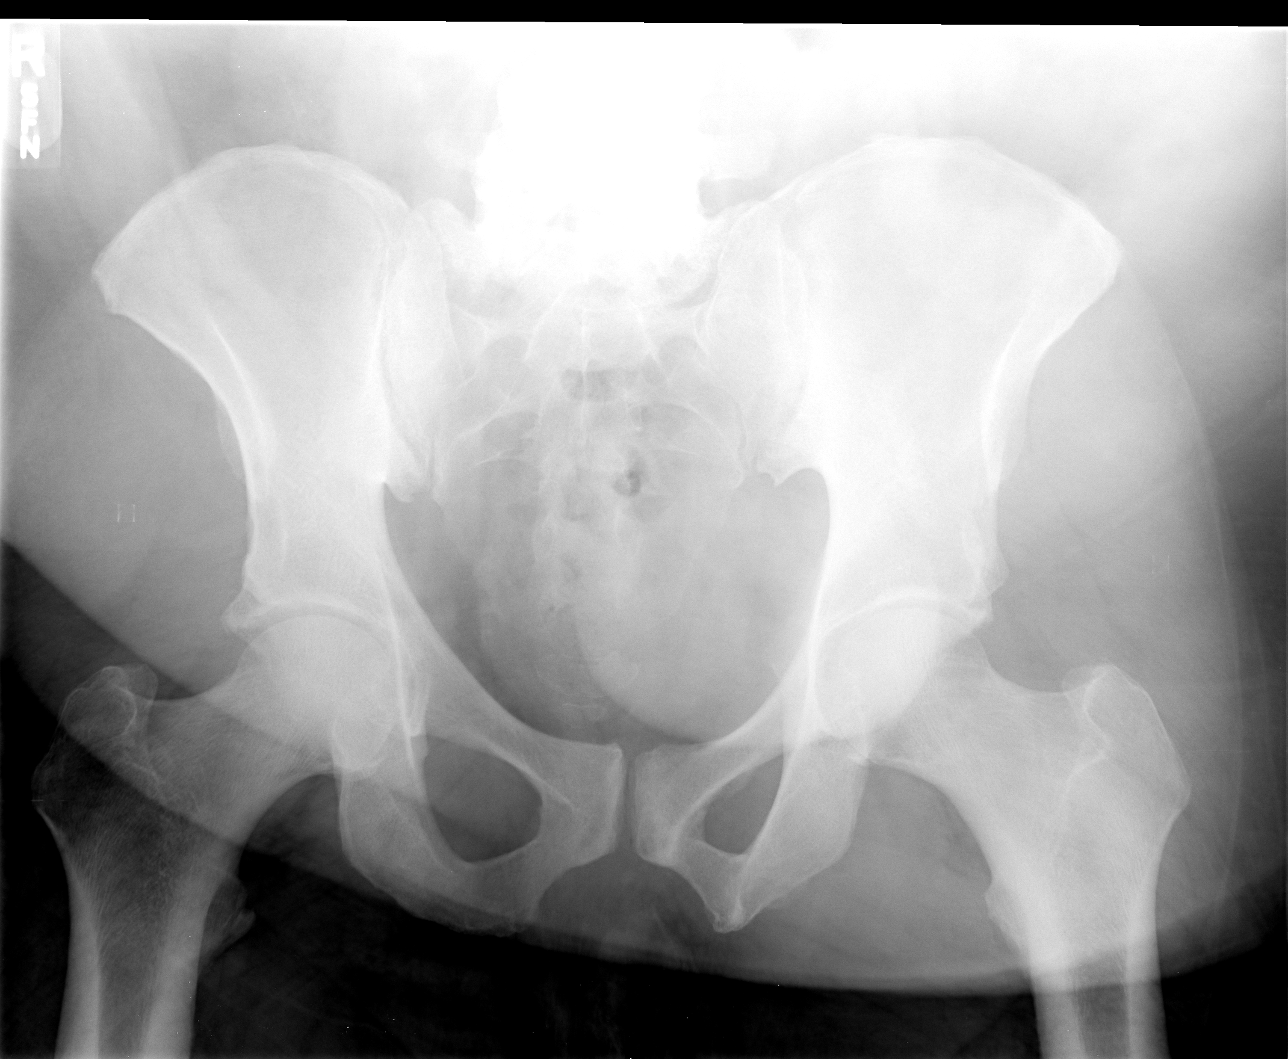

[view not recorded (2 of 3)]
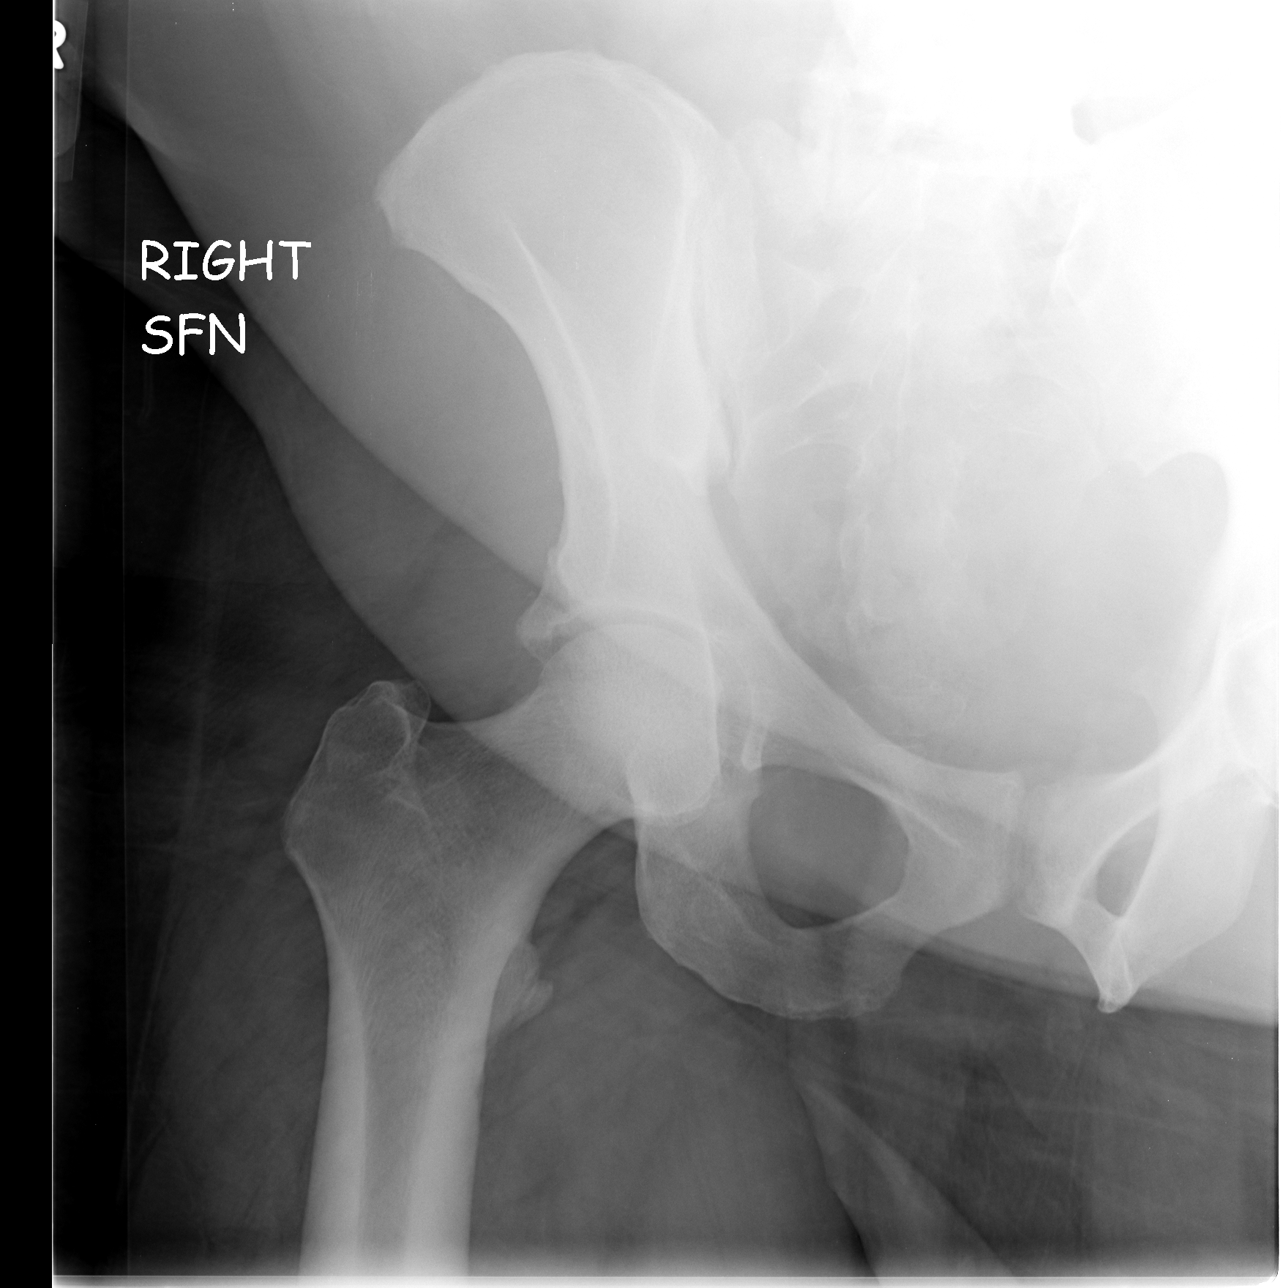

[view not recorded (3 of 3)]
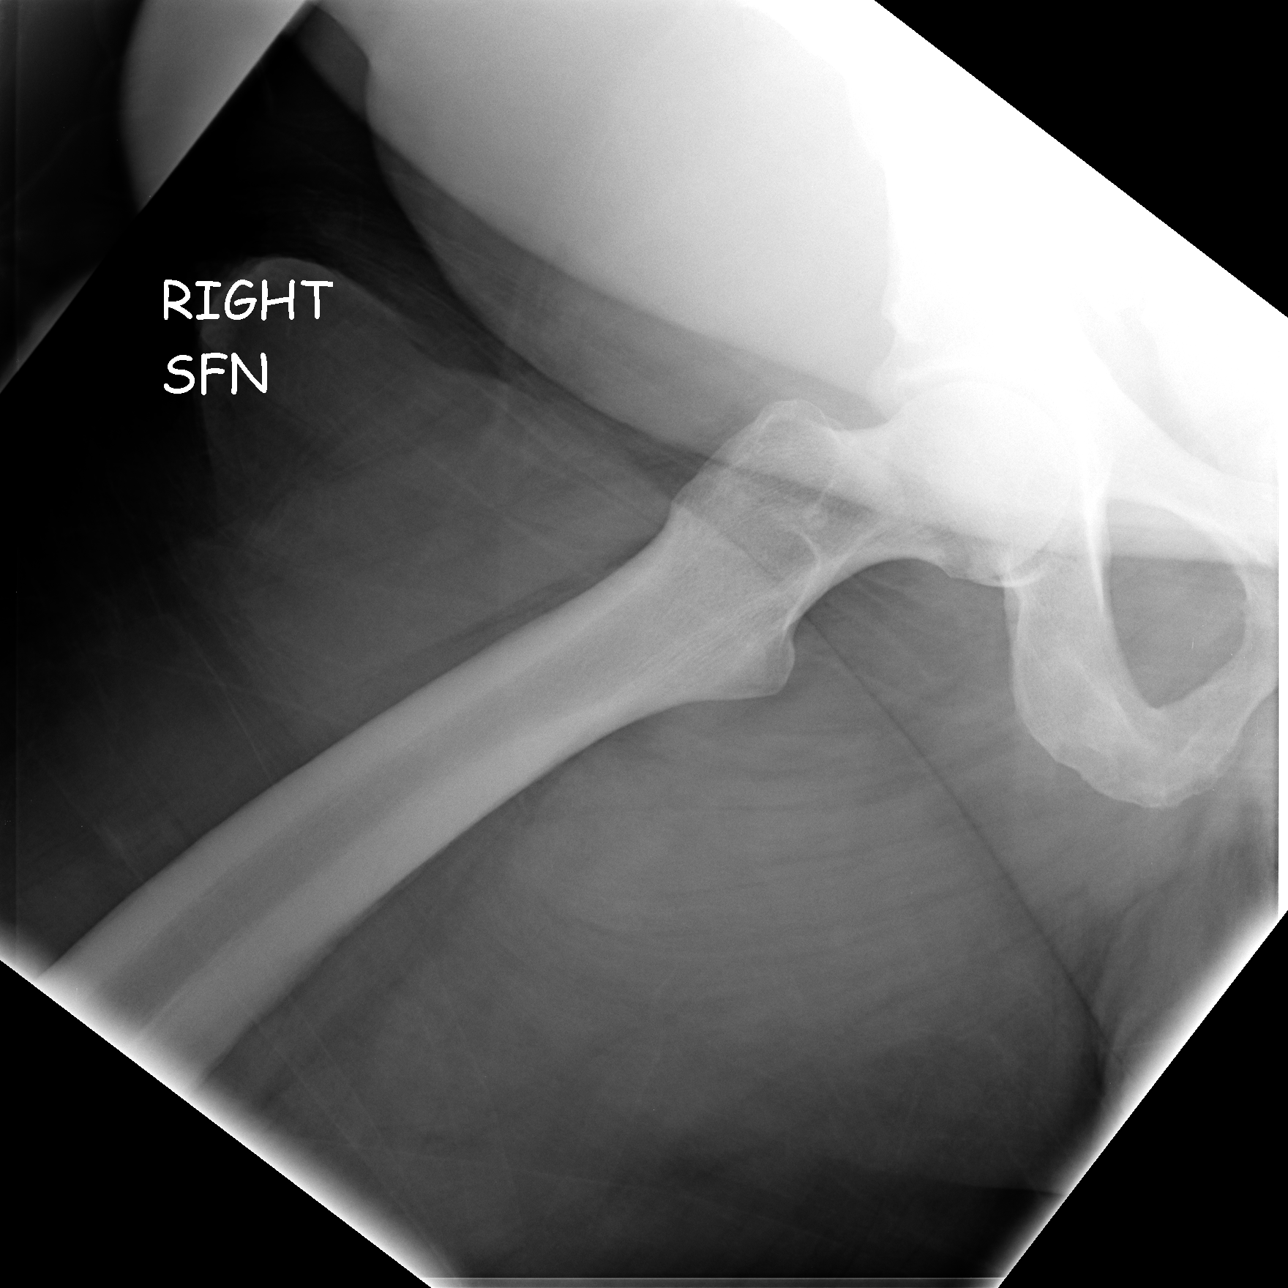

[3 of 3 positions shown; findings below may reference images not displayed]

FINDINGS: There is no evidence of hip fracture or dislocation.
There is no evidence of arthropathy or other focal bone
abnormality.
IMPRESSION: Negative.

## 2014-04-06 NOTE — Telephone Encounter (Signed)
CPAP titration study is in EPIC. Please advise PCC;s thanks

## 2014-04-06 NOTE — Telephone Encounter (Signed)
Patient has been scheduled for CPAP Titration 06/13/14.  I contacted pt with the appt info, advised I am mailing him a Sleep Packet.  Also advised if he wishes to be placed on the Sleep Lab cancellation list to call, gave him the Sleep Lab address & phone number Lucilla Edin

## 2014-06-13 ENCOUNTER — Ambulatory Visit (HOSPITAL_BASED_OUTPATIENT_CLINIC_OR_DEPARTMENT_OTHER): Payer: Medicare Other | Attending: Pulmonary Disease | Admitting: Radiology

## 2014-06-13 VITALS — Ht 71.0 in | Wt 380.0 lb

## 2014-06-13 DIAGNOSIS — Z6841 Body Mass Index (BMI) 40.0 and over, adult: Secondary | ICD-10-CM | POA: Insufficient documentation

## 2014-06-13 DIAGNOSIS — G4733 Obstructive sleep apnea (adult) (pediatric): Secondary | ICD-10-CM | POA: Diagnosis not present

## 2014-06-13 DIAGNOSIS — R0683 Snoring: Secondary | ICD-10-CM | POA: Insufficient documentation

## 2014-06-13 DIAGNOSIS — G4710 Hypersomnia, unspecified: Secondary | ICD-10-CM | POA: Insufficient documentation

## 2014-06-14 ENCOUNTER — Telehealth: Payer: Self-pay | Admitting: Pulmonary Disease

## 2014-06-14 DIAGNOSIS — G4733 Obstructive sleep apnea (adult) (pediatric): Secondary | ICD-10-CM

## 2014-06-14 NOTE — Sleep Study (Signed)
[facility]  NAME: John [name] DATE OF BIRTH:  [birth year] MEDICAL RECORD NUMBER [id number]  LOCATION: [facility]  PHYSICIAN: Coralyn HellingVineet Stepfon Rawles, M.D. DATE OF STUDY: 06/13/2014  SLEEP STUDY TYPE: CPAP Titration study               REFERRING PHYSICIAN: Coralyn HellingSood, Anyssa Sharpless, MD  INDICATION FOR STUDY:  John [name] is a 48 y.o. male who presents to the sleep lab for evaluation of hypersomnia with obstructive sleep apnea.  He reports snoring, sleep disruption, apnea, and daytime sleepiness.  He has prior history of sleep apnea, and has been on CPAP.  He continues to have daytime hypersomnolence.  EPWORTH SLEEPINESS SCORE: 19. HEIGHT: 5\' 11"  (180.3 cm)  WEIGHT: 380 lb (172.367 kg)    Body mass index is 53.02 kg/(m^2).  NECK SIZE: 22.5 in.  MEDICATIONS:  No current outpatient prescriptions on file prior to visit.   No current facility-administered medications on file prior to visit.    SLEEP ARCHITECTURE:  Total recording time: 355 minutes.  Total sleep time was: 8.5 minutes.  Sleep efficiency: 2.4%.  Sleep latency: 243.5 minutes.  REM latency: 0 minutes.  Stage N1: 76.5%.  Stage N2: 23.5%.  Stage N3: 0%.  Stage R:  0%.  Supine sleep: 0 minutes.  Non-supine sleep: 8.5 minutes.  CARDIAC DATA:  Average heart rate: 85 beats per minute. Rhythm strip: sinus rhythm with PACs.  RESPIRATORY DATA: Average respiratory rate: 20. Snoring: moderate.  He was started on CPAP 6 cm H2O.  He had insufficient sleep to determine adequate pressure setting.  MOVEMENT/PARASOMNIA:  Periodic limb movement: 0.  Period limb movements with arousals: 0. Restroom trips: 1.  OXYGEN DATA:  Baseline oxygenation: 96%. Lowest SaO2: 91%. Time spent below SaO2 90%: 0 minutes. Supplemental oxygen used: none.  IMPRESSION/ RECOMMENDATION:   This was an unsuccessful CPAP titration study.  He had insufficient sleep to determine adequate pressure setting.    He had difficulty  with sleep initiation and sleep maintenance due to respiratory events.  He also reported to sleep tech that he would prefer to have any repeat sleep studies conducted during the daytime.  He will need to return the the sleep lab for a repeat titration study.  I would recommend that he start with CPAP 10 cm H2O and increase up from there as needed.  He should also have study done during daytime hours to more closely match his reported sleep schedule.   Coralyn HellingVineet Dustyn Dansereau, M.D. Diplomate, Biomedical engineerAmerican Board of Sleep Medicine  ELECTRONICALLY SIGNED ON:  06/14/2014, 3:06 PM [facility] PH: [phone]   FX: [phone] ACCREDITED BY THE AMERICAN ACADEMY OF SLEEP MEDICINE

## 2014-06-14 NOTE — Telephone Encounter (Signed)
Will have my nurse inform pt that he had insufficient sleep time to determine proper CPAP setting.  He will need to have repeat sleep study done.  Will arrange for repeat sleep study to be done during daytime to more closely match his reported sleep schedule.  I have placed order for repeat CPAP titration study.  Please ensure that pt should only get charged for one sleep study and not both.

## 2014-06-15 NOTE — Telephone Encounter (Signed)
Pt scheduled for daytime Sleep Study 07/03/14 arrival @7  am.  Also noted to John [name] @Sleep  Lab that per Dr. Craige CottaSood, pt was to only be charged for one study.  Pt notifed of date & time for daytime study.  Packet mailed to the patient John [name]

## 2014-06-15 NOTE — Telephone Encounter (Signed)
Results have been explained to patient, pt expressed understanding.  [facility] that the patient does not get charged for two studies.  Nothing further needed.

## 2014-07-03 ENCOUNTER — Encounter (HOSPITAL_BASED_OUTPATIENT_CLINIC_OR_DEPARTMENT_OTHER): Payer: Medicare Other

## 2014-07-18 ENCOUNTER — Encounter (HOSPITAL_BASED_OUTPATIENT_CLINIC_OR_DEPARTMENT_OTHER): Payer: Medicare Other

## 2014-08-08 ENCOUNTER — Encounter (HOSPITAL_BASED_OUTPATIENT_CLINIC_OR_DEPARTMENT_OTHER): Payer: Medicare Other

## 2014-08-14 ENCOUNTER — Ambulatory Visit (HOSPITAL_BASED_OUTPATIENT_CLINIC_OR_DEPARTMENT_OTHER): Payer: Medicare Other | Attending: Pulmonary Disease | Admitting: Radiology

## 2014-08-14 DIAGNOSIS — G4733 Obstructive sleep apnea (adult) (pediatric): Secondary | ICD-10-CM

## 2014-08-14 DIAGNOSIS — R0683 Snoring: Secondary | ICD-10-CM

## 2014-08-28 ENCOUNTER — Telehealth: Payer: Self-pay | Admitting: Pulmonary Disease

## 2014-08-28 DIAGNOSIS — G4733 Obstructive sleep apnea (adult) (pediatric): Secondary | ICD-10-CM

## 2014-08-28 NOTE — Telephone Encounter (Signed)
Pt would like his sleep study results.  VS - please advise. Thanks.

## 2014-08-29 NOTE — Telephone Encounter (Signed)
[phone], pt cb to see if results have been read.

## 2014-08-29 NOTE — Telephone Encounter (Signed)
Called and spoke with patient, aware that as soon as results are reviewed we will contact him. Pt requesting that a letter be written explaining to results once reviewed- this is for his Disability Review. Please advise Dr Craige CottaSood. Thanks.

## 2014-08-31 DIAGNOSIS — G4733 Obstructive sleep apnea (adult) (pediatric): Secondary | ICD-10-CM

## 2014-08-31 NOTE — Sleep Study (Signed)
[facility]  NAME: John [name] Pendergraft DATE OF BIRTH:  14-Jan-1966 MEDICAL RECORD NUMBER [id number]  LOCATION: [facility]  PHYSICIAN: Coralyn HellingVineet Demont Linford, M.[name]. DATE OF STUDY: 08/14/2014  SLEEP STUDY TYPE: CPAP Titration               REFERRING PHYSICIAN: Coralyn HellingSood, Romana Deaton, MD  INDICATION FOR STUDY:  John [name] [name] is a 49 y.o. male who presents to the sleep lab for evaluation of hypersomnia with obstructive sleep apnea.  He reports snoring, sleep disruption, apnea, and daytime sleepiness.  He has history of sleep apnea and is on CPAP.  He continues to have daytime sleepiness.  EPWORTH SLEEPINESS SCORE: 16. HEIGHT: 5\' 11"  WEIGHT: 380 lbs   BMI:  53 NECK SIZE: 22.5 in.  MEDICATIONS:  No current outpatient prescriptions on file prior to visit.   No current facility-administered medications on file prior to visit.    SLEEP ARCHITECTURE:  Total recording time: 359.5 minutes.  Total sleep time was: 129 minutes.  Sleep efficiency: 35.9%.  Sleep latency: 19 minutes.  REM latency: N/A minutes.  Stage N1: 24.2%.  Stage N2: 75.6%.  Stage N3: 0%.  Stage R:  0%.  Supine sleep: 88.5 minutes.  Non-supine sleep: 40.5 minutes.  CARDIAC DATA:  Average heart rate: 81 beats per minute. Rhythm strip: normal sinus rhythm.  RESPIRATORY DATA: Average respiratory rate: 20. Snoring: loud  He was started on CPAP 10 and increased to CPAP 19 cm H2O.  With CPAP at 17 cm H2O his AHI was reduced to 3.3, and he appeared to have most consolidated sleep at this pressure setting.  MOVEMENT/PARASOMNIA:  Periodic limb movement: 5.6.  Period limb movements with arousals: 2.8. Restroom trips: 2.  OXYGEN DATA:  Baseline oxygenation: 96%. Lowest SaO2: 89%. Time spent below SaO2 90%: 2.9 minutes. Supplemental oxygen used: none.  IMPRESSION/ RECOMMENDATION:   He did best with CPAP at 17 cm H2O.  He had more sleep disruption with higher pressures.  He was not observed in REM sleep.   He was fitted with a Resmed mirage quattro medium size full face mask.     Coralyn HellingVineet Cabe Lashley, M.[name]. Diplomate, Biomedical engineerAmerican Board of Sleep Medicine  ELECTRONICALLY SIGNED ON:  08/31/2014, 1:48 PM [facility] PH: [phone]   FX: [phone] ACCREDITED BY THE AMERICAN ACADEMY OF SLEEP MEDICINE

## 2014-08-31 NOTE — Telephone Encounter (Signed)
CPAP 08/14/14 >> CPAP 17 cm H2O >> AHI 3.3, no R.  Will have my nurse inform pt that he did well with CPAP 17 cm H2O.  Please send order to have him set up with CPAP 17 cm H2O with heated humidity and mask of choice.  He needs ROV in 2 months after CPAP set up.

## 2014-08-31 NOTE — Telephone Encounter (Signed)
Patient notified of results.  Orders entered for CPAP.  Patient will schedule OV after he receives CPAP. Nothing further needed.

## 2014-09-13 ENCOUNTER — Telehealth: Payer: Self-pay | Admitting: Pulmonary Disease

## 2014-09-13 DIAGNOSIS — G4733 Obstructive sleep apnea (adult) (pediatric): Secondary | ICD-10-CM

## 2014-09-13 NOTE — Addendum Note (Signed)
Addended by: Maisie FusGREEN, Roland Lipke M on: 09/13/2014 01:59 PM   Modules accepted: Orders

## 2014-09-13 NOTE — Telephone Encounter (Signed)
Per VS order Alice study  [facility]  [facility]

## 2014-11-06 ENCOUNTER — Encounter: Payer: Self-pay | Admitting: Pulmonary Disease

## 2014-11-06 ENCOUNTER — Ambulatory Visit (INDEPENDENT_AMBULATORY_CARE_PROVIDER_SITE_OTHER): Payer: Medicare Other | Admitting: Pulmonary Disease

## 2014-11-06 DIAGNOSIS — G4733 Obstructive sleep apnea (adult) (pediatric): Secondary | ICD-10-CM

## 2014-11-06 NOTE — Patient Instructions (Signed)
Will arrange for referral to nutrition specialist to assist with weight loss Check with [facility] about your CPAP mask fit Follow up in 1 year

## 2014-11-06 NOTE — Progress Notes (Signed)
Chief Complaint  Patient presents with  . Follow-up    Wears CPAP nightly. Pt reports that pressure setting at times seems too little. Mask fits okay - comes loose during the night, will not stay tight.     History of Present Illness: John [name] is a 49 y.o. male with OSA.  He has been sleeping better with CPAP.  He feels like he is not getting enough pressure sometimes.  His mask does leak.  He has been trying to lose weight.  He has been on special diets (shakes) >> these work for a while, but then he regains the weight.  TESTS: CPAP 08/14/14 >> CPAP 17 cm H2O >> AHI 3.3, no R. CPAP 10/06/14 to 11/04/14 >> used on 30 of 30 nights with average 7 hrs and 21 min.  Average AHI is 2.8 with CPAP 17 cm H2O.   Past medical hx >> HTN  Past surgical hx, Medications, Allergies, Family hx, Social hx all reviewed.   Physical Exam: Blood pressure 142/98, pulse 108, height 5' 10.5" (1.791 m), weight 405 lb (183.707 kg), SpO2 95 %. Body mass index is 57.27 kg/(m^2).   General - No distress ENT - No sinus tenderness, no oral exudate, no LAN, MP 4, enlarged tongue Cardiac - s1s2 regular, no murmur Chest - No wheeze/rales/dullness Back - No focal tenderness Abd - Soft, non-tender Ext - No edema Neuro - Normal strength Skin - No rashes Psych - normal mood, and behavior   Assessment/Plan:  Obstructive sleep apnea. Plan: - continue CPAP 17 cm H2O - asked him to have his mask fit assessed with his DME  Morbid obesity. Plan: - will arrange for referral to nutrition to assist with weight loss   Coralyn HellingVineet Vani Gunner, MD [place] [facility]/Sleep Pager:  [phone]

## 2014-11-08 ENCOUNTER — Telehealth: Payer: Self-pay | Admitting: Pulmonary Disease

## 2014-11-08 DIAGNOSIS — Z9989 Dependence on other enabling machines and devices: Principal | ICD-10-CM

## 2014-11-08 DIAGNOSIS — G4733 Obstructive sleep apnea (adult) (pediatric): Secondary | ICD-10-CM

## 2014-11-08 NOTE — Telephone Encounter (Signed)
-----   Message from Lilian KapurAnita S Walker sent at 11/06/2014  3:23 PM EDT ----- Regarding: Need Referral  Need a referral for mask fitting to be able to get DME to handle this.  Thanks, GraftonAnita, [facility]

## 2014-11-26 ENCOUNTER — Ambulatory Visit: Payer: Medicare Other | Admitting: Dietician

## 2015-01-09 ENCOUNTER — Ambulatory Visit: Payer: Medicare Other | Admitting: Dietician

## 2016-01-27 ENCOUNTER — Ambulatory Visit: Payer: Medicare Other | Admitting: Pulmonary Disease

## 2016-04-08 ENCOUNTER — Ambulatory Visit: Payer: Medicare Other | Admitting: Pulmonary Disease

## 2016-04-24 ENCOUNTER — Ambulatory Visit: Payer: Medicare Other | Admitting: Pulmonary Disease

## 2016-05-20 ENCOUNTER — Ambulatory Visit: Payer: Medicare Other | Admitting: Family

## 2016-06-12 ENCOUNTER — Ambulatory Visit: Payer: Medicare Other | Admitting: Pulmonary Disease

## 2016-06-16 ENCOUNTER — Ambulatory Visit: Payer: Medicare Other | Admitting: Pulmonary Disease

## 2017-03-13 ENCOUNTER — Ambulatory Visit (HOSPITAL_COMMUNITY)
Admission: EM | Admit: 2017-03-13 | Discharge: 2017-03-13 | Disposition: A | Discharge status: Home / Self Care | Payer: Medicare Other | Attending: Radiology | Admitting: Radiology

## 2017-03-13 ENCOUNTER — Encounter (HOSPITAL_COMMUNITY): Payer: Self-pay | Admitting: *Deleted

## 2017-03-13 DIAGNOSIS — J069 Acute upper respiratory infection, unspecified: Secondary | ICD-10-CM | POA: Diagnosis not present

## 2017-03-13 MED ORDER — LISINOPRIL-HYDROCHLOROTHIAZIDE 20-12.5 MG PO TABS: 1.0000 | ORAL_TABLET | Freq: Every day | ORAL | 0 refills | Status: DC

## 2017-03-13 MED ORDER — AMOXICILLIN-POT CLAVULANATE 875-125 MG PO TABS: 1.0000 | ORAL_TABLET | Freq: Two times a day (BID) | ORAL | 0 refills | Status: DC

## 2017-03-13 MED ORDER — FLUTICASONE PROPIONATE 50 MCG/ACT NA SUSP: 1.0000 | Freq: Every day | NASAL | 0 refills | Status: DC

## 2017-03-13 NOTE — ED Triage Notes (Signed)
States started with bilat ear itching 2 days ago.  Woke this AM with them "clogged up".

## 2017-03-13 NOTE — ED Provider Notes (Addendum)
[facility]    CSN: [id number] Arrival date & time: 03/13/17  1251     History   Chief Complaint Chief Complaint  Patient presents with  . Otalgia    HPI John [name] is a 51 y.o. male.   51 y.o. male presents with bilateral ear pain nasal congestion and headache  X 3 days. Condition is acute  in nature. Condition is made better by nothing. Condition is made worse by nothing. Patient denies any relief from  OTCear drops prior to there arrival at this facility. Patient denies any cough, fever, nausea or vomitting.        Past Medical History:  Diagnosis Date  . Hypertension   . Sleep apnea     Patient Active Problem List   Diagnosis Date Noted  . OSA (obstructive sleep apnea) 08/17/2013  . Morbid obesity (HCC) 08/17/2013  . Hypertension 08/17/2013    Past Surgical History:  Procedure Laterality Date  . ANKLE SURGERY    . APPENDECTOMY         Home Medications    Prior to Admission medications   Medication Sig Start Date End Date Taking? Authorizing Provider  omeprazole (PRILOSEC) 20 MG capsule Take 1 capsule by mouth daily. 10/14/14  Yes [provider]  lisinopril-hydrochlorothiazide (PRINZIDE,ZESTORETIC) 20-12.5 MG per tablet Take 1 tablet by mouth daily. 10/14/14   [provider]  risperiDONE (RISPERDAL) 2 MG tablet Take 1 tablet by mouth daily. 09/17/14   [provider]    Family History No family history on file.  Social History Social History  Substance Use Topics  . Smoking status: Never Smoker  . Smokeless tobacco: Not on file  . Alcohol use No     Comment: none x 1 month     Allergies   Aspirin   Review of Systems Review of Systems  Constitutional: Negative for chills and fever.  HENT: Positive for congestion and ear pain ( bilateral ). Negative for sore throat.   Eyes: Negative for pain and visual disturbance.  Respiratory: Negative for cough and shortness of breath.   Cardiovascular: Negative  for chest pain and palpitations.  Gastrointestinal: Negative for abdominal pain and vomiting.  Genitourinary: Negative for dysuria and hematuria.  Musculoskeletal: Negative for arthralgias and back pain.  Skin: Negative for color change and rash.  Neurological: Positive for headaches. Negative for seizures and syncope.  All other systems reviewed and are negative.    Physical Exam Triage Vital Signs ED Triage Vitals  Enc Vitals Group     BP 03/13/17 1410 (!) 178/95     Pulse Rate 03/13/17 1410 94     Resp 03/13/17 1410 20     Temp 03/13/17 1410 98.7 F (37.1 C)     Temp Source 03/13/17 1410 Oral     SpO2 03/13/17 1410 100 %     Weight --      Height --      Head Circumference --      Peak Flow --      Pain Score 03/13/17 1408 7     Pain Loc --      Pain Edu? --      Excl. in GC? --    No data found.   Updated Vital Signs BP (!) 178/95   Pulse 94   Temp 98.7 F (37.1 C) (Oral)   Resp 20   SpO2 100%   Visual Acuity Right Eye Distance:   Left Eye Distance:   Bilateral  Distance:    Right Eye Near:   Left Eye Near:    Bilateral Near:     Physical Exam  Constitutional: He appears well-developed and well-nourished.  HENT:  Head: Normocephalic and atraumatic.  Eyes: Conjunctivae are normal.  Neck: Neck supple.  Cardiovascular: Normal rate and regular rhythm.   No murmur heard. Pulmonary/Chest: Effort normal and breath sounds normal. No respiratory distress.  Abdominal: Soft. There is no tenderness.  Musculoskeletal: He exhibits no edema.  Neurological: He is alert.  Skin: Skin is warm and dry.  Psychiatric: He has a normal mood and affect.  Nursing note and vitals reviewed.    UC Treatments / Results  Labs (all labs ordered are listed, but only abnormal results are displayed) Labs Reviewed - No data to display  EKG  EKG Interpretation None       Radiology No results found.  Procedures Procedures (including critical care time)  Medications  Ordered in UC Medications - No data to display   Initial Impression / Assessment and Plan / UC Course  I have reviewed the triage vital signs and the nursing notes.  Pertinent labs & imaging results that were available during my care of the patient were reviewed by me and considered in my medical decision making (see chart for details).       Final Clinical Impressions(s) / UC Diagnoses   Final diagnoses:  None    New Prescriptions New Prescriptions   No medications on file     Controlled Substance Prescriptions [place] Controlled Substance Registry consulted? Not applicable   Alene Mires, NP 03/13/17 1455    Alene Mires, NP 03/13/17 1504

## 2017-07-28 ENCOUNTER — Encounter: Payer: Self-pay | Admitting: Pulmonary Disease

## 2017-07-28 ENCOUNTER — Ambulatory Visit (INDEPENDENT_AMBULATORY_CARE_PROVIDER_SITE_OTHER): Payer: Medicare Other | Admitting: Pulmonary Disease

## 2017-07-28 VITALS — BP 146/68 | HR 110 | Ht 71.0 in | Wt >= 6400 oz

## 2017-07-28 DIAGNOSIS — G4733 Obstructive sleep apnea (adult) (pediatric): Secondary | ICD-10-CM | POA: Diagnosis not present

## 2017-07-28 DIAGNOSIS — I10 Essential (primary) hypertension: Secondary | ICD-10-CM

## 2017-07-28 DIAGNOSIS — Z6841 Body Mass Index (BMI) 40.0 and over, adult: Secondary | ICD-10-CM | POA: Diagnosis not present

## 2017-07-28 DIAGNOSIS — Z9989 Dependence on other enabling machines and devices: Secondary | ICD-10-CM | POA: Diagnosis not present

## 2017-07-28 DIAGNOSIS — Z0000 Encounter for general adult medical examination without abnormal findings: Secondary | ICD-10-CM | POA: Diagnosis not present

## 2017-07-28 NOTE — Patient Instructions (Signed)
Will have [facility] refit your CPAP mask  Will arrange for referral to new primary care provider  Follow up in 1 year

## 2017-07-28 NOTE — Progress Notes (Signed)
[facility]  Chief Complaint  Patient presents with  . Follow-up    Pt is doing well with cpap machine over all. Pt is needing supplies, and new machine DME-[facility]. Pt is requesting a program to help with weight loss; and pt would like new PCP with [facility]     Vital signs: BP (!) 146/68 (BP Location: Right Arm, Patient Position: Sitting)   Pulse (!) 110   Ht 5\' 11"  (1.803 m)   Wt (!) 423 lb (191.9 kg)   SpO2 96%   BMI 59.00 kg/m   History of Present Illness: John [name] is a 52 y.o. male obstructive sleep apnea  He uses CPAP nightly.  His mask isn't very comfortable.  He uses full face mask.  Otherwise CPAP working well.  He needs a new PCP.  He wants help with weight loss.  Physical Exam:  General - pleasant Eyes - pupils reactive ENT - no sinus tenderness, no oral exudate, no LAN, MP 4 Cardiac - regular, no murmur Chest - no wheeze, rales Abd - soft, non tender Ext - no edema Skin - no rashes Neuro - normal strength Psych - normal mood  Assessment/Plan:  Obstructive sleep apnea. - he is compliant with CPAP and reports benefit from therapy - will arrange for CPAP mask refitting - explained he wouldn't be eligible for a new machine until his machine is more than 51 yrs old - continue CPAP 17 cm H2O  Health maintenance. Hypertension. Morbid obesity. - will arrange for referral to new primary care provider   Patient Instructions  Will have [facility] refit your CPAP mask  Will arrange for referral to new primary care provider  Follow up in 1 year    Coralyn HellingVineet Demarko Zeimet, MD [facility] 07/28/2017, 4:23 PM Pager:  [phone]  Flow Sheet  Sleep tests: CPAP 08/14/14 >> CPAP 17 cm H2O >> AHI 3.3, no R. CPAP 04/30/17 to 07/28/17 >> used on 89 of 90 nights with average 7 hrs 33 min.  Average AHI 0.7 with CPAP 17 cm H2O.  Past Medical History: He  has a past medical history of Hypertension and Sleep  apnea.  Past Surgical History: He  has a past surgical history that includes Appendectomy and Ankle surgery.  Family History: His family history is not on file.  Social History: He  reports that  has never smoked. he has never used smokeless tobacco. He reports that he uses drugs. Drug: Marijuana. He reports that he does not drink alcohol.  Medications: Allergies as of 07/28/2017      Reactions   Aspirin Nausea Only      Medication List        Accurate as of 07/28/17  4:23 PM. Always use your most recent med list.          amoxicillin-clavulanate 875-125 MG tablet Commonly known as:  AUGMENTIN Take 1 tablet by mouth every 12 (twelve) hours.   fluticasone 50 MCG/ACT nasal spray Commonly known as:  FLONASE Place 1 spray into both nostrils daily.   lisinopril-hydrochlorothiazide 20-12.5 MG tablet Commonly known as:  PRINZIDE,ZESTORETIC Take 1 tablet by mouth daily.   omeprazole 20 MG capsule Commonly known as:  PRILOSEC Take 1 capsule by mouth daily.   risperiDONE 2 MG tablet Commonly known as:  RISPERDAL Take 1 tablet by mouth daily.

## 2017-08-04 ENCOUNTER — Telehealth: Payer: Self-pay | Admitting: Pulmonary Disease

## 2017-08-04 DIAGNOSIS — G4733 Obstructive sleep apnea (adult) (pediatric): Secondary | ICD-10-CM

## 2017-08-04 NOTE — Telephone Encounter (Signed)
Called pt who stated he was needing new mask and supplies from his DME.  [facility]. Pt expressed understanding. Nothing further needed at this current time.

## 2017-10-25 ENCOUNTER — Ambulatory Visit: Payer: Medicare Other | Admitting: Nurse Practitioner

## 2017-11-03 ENCOUNTER — Ambulatory Visit: Payer: Medicare Other | Admitting: Adult Health

## 2017-11-18 DIAGNOSIS — F209 Schizophrenia, unspecified: Secondary | ICD-10-CM | POA: Diagnosis not present

## 2017-12-09 DIAGNOSIS — F209 Schizophrenia, unspecified: Secondary | ICD-10-CM | POA: Diagnosis not present

## 2017-12-29 DIAGNOSIS — F209 Schizophrenia, unspecified: Secondary | ICD-10-CM | POA: Diagnosis not present

## 2018-02-02 ENCOUNTER — Ambulatory Visit: Payer: Medicare Other | Admitting: Adult Health

## 2018-02-09 DIAGNOSIS — F209 Schizophrenia, unspecified: Secondary | ICD-10-CM | POA: Diagnosis not present

## 2018-03-04 ENCOUNTER — Encounter

## 2018-04-07 DIAGNOSIS — F209 Schizophrenia, unspecified: Secondary | ICD-10-CM | POA: Diagnosis not present

## 2018-10-19 DIAGNOSIS — F209 Schizophrenia, unspecified: Secondary | ICD-10-CM | POA: Diagnosis not present

## 2018-12-30 DIAGNOSIS — F209 Schizophrenia, unspecified: Secondary | ICD-10-CM | POA: Diagnosis not present

## 2019-01-26 ENCOUNTER — Encounter (HOSPITAL_COMMUNITY): Payer: Self-pay

## 2019-01-26 ENCOUNTER — Other Ambulatory Visit: Payer: Self-pay

## 2019-01-26 ENCOUNTER — Ambulatory Visit (HOSPITAL_COMMUNITY)
Admission: EM | Admit: 2019-01-26 | Discharge: 2019-01-26 | Disposition: A | Discharge status: Home / Self Care | Payer: Medicare Other | Attending: Emergency Medicine | Admitting: Emergency Medicine

## 2019-01-26 DIAGNOSIS — H8112 Benign paroxysmal vertigo, left ear: Secondary | ICD-10-CM

## 2019-01-26 DIAGNOSIS — H6982 Other specified disorders of Eustachian tube, left ear: Secondary | ICD-10-CM

## 2019-01-26 DIAGNOSIS — H66002 Acute suppurative otitis media without spontaneous rupture of ear drum, left ear: Secondary | ICD-10-CM | POA: Diagnosis not present

## 2019-01-26 DIAGNOSIS — H6992 Unspecified Eustachian tube disorder, left ear: Secondary | ICD-10-CM

## 2019-01-26 MED ORDER — FLUTICASONE PROPIONATE 50 MCG/ACT NA SUSP: 1.0000 | Freq: Every day | NASAL | 2 refills | Status: DC

## 2019-01-26 MED ORDER — AMOXICILLIN-POT CLAVULANATE 875-125 MG PO TABS: 1.0000 | ORAL_TABLET | Freq: Two times a day (BID) | ORAL | 0 refills | Status: AC

## 2019-01-26 MED ORDER — PREDNISONE 10 MG PO TABS: 20.0000 mg | ORAL_TABLET | Freq: Two times a day (BID) | ORAL | 0 refills | Status: AC

## 2019-01-26 MED ORDER — OMEPRAZOLE 20 MG PO CPDR: 20.0000 mg | DELAYED_RELEASE_CAPSULE | Freq: Every day | ORAL | 0 refills | Status: DC

## 2019-01-26 NOTE — Discharge Instructions (Addendum)
Begin augmentin twice daily for 1 week Flonase nasal spray 1-2 spray in each nostril daily for next 1-2 weeks Prednisone 20 mg twice daily for 4 days  Read attached on vertigo  Your blood pressure was elevated today in clinic. Please be sure to take blood pressure medications as prescribed. Please monitor your blood pressure at home or when you go to a [facility]/Gym. Please follow up with your primary care doctor to recheck blood pressure and discuss any need for medication changes.   Please go to Emergency Room if you start to experience severe headache, vision changes, decreased urine production, chest pain, shortness of breath, speech slurring, one sided weakness.

## 2019-01-26 NOTE — ED Provider Notes (Signed)
[facility]    CSN: [id number] Arrival date & time: 01/26/19  2585      History   Chief Complaint Chief Complaint  Patient presents with  . Otalgia    HPI John [name] is a 53 y.o. male history of hypertension, OSA, GERD, presenting today for evaluation of dizziness and left ear discomfort.  Patient states that beginning last night he laid down and turn towards the left and had a spinning sensation of dizziness that lasted a few seconds.  Went away when sitting upright.  This is been recurrent since last night with turning head towards left.  He also notes that he has felt a sensation as if there is fluid on his left ear and has had some mild discomfort associated with it.  Notes that he has had small amount of drainage.  Denies any headaches, vision changes, confusion or difficulty speaking.  Denies lightheadedness or sensation of passing out.  Denies nausea or vomiting.  Denies chest pain or shortness of breath.  States that he had history of similar 1 year ago, but no diagnosis of vertigo.  HPI  Past Medical History:  Diagnosis Date  . Hypertension   . Sleep apnea     Patient Active Problem List   Diagnosis Date Noted  . OSA (obstructive sleep apnea) 08/17/2013  . Morbid obesity ([place]) 08/17/2013  . Hypertension 08/17/2013    Past Surgical History:  Procedure Laterality Date  . ANKLE SURGERY    . APPENDECTOMY         Home Medications    Prior to Admission medications   Medication Sig Start Date End Date Taking? Authorizing Provider  amoxicillin-clavulanate (AUGMENTIN) 875-125 MG tablet Take 1 tablet by mouth every 12 (twelve) hours for 7 days. 01/26/19 02/02/19  Mayan Dolney C, PA-C  fluticasone (FLONASE) 50 MCG/ACT nasal spray Place 1-2 sprays into both nostrils daily. 01/26/19   Maleiya Pergola C, PA-C  lisinopril-hydrochlorothiazide (PRINZIDE,ZESTORETIC) 20-12.5 MG tablet Take 1 tablet by mouth daily. 03/13/17 03/27/17  Jacqualine Mau, NP   omeprazole (PRILOSEC) 20 MG capsule Take 1 capsule (20 mg total) by mouth daily. 01/26/19   Jameila Keeny C, PA-C  predniSONE (DELTASONE) 10 MG tablet Take 2 tablets (20 mg total) by mouth 2 (two) times daily with a meal for 4 days. 01/26/19 01/30/19  Rigdon Macomber C, PA-C  risperiDONE (RISPERDAL) 2 MG tablet Take 1 tablet by mouth daily. 09/17/14   [provider]    Family History History reviewed. No pertinent family history.  Social History Social History   Tobacco Use  . Smoking status: Never Smoker  . Smokeless tobacco: Never Used  Substance Use Topics  . Alcohol use: No    Comment: none x 1 month  . Drug use: Yes    Types: Marijuana     Allergies   Aspirin   Review of Systems Review of Systems  Constitutional: Negative for fatigue and fever.  HENT: Positive for ear pain and hearing loss. Negative for congestion, sinus pressure and sore throat.   Eyes: Negative for photophobia, pain and visual disturbance.  Respiratory: Negative for cough and shortness of breath.   Cardiovascular: Negative for chest pain.  Gastrointestinal: Negative for abdominal pain, nausea and vomiting.  Genitourinary: Negative for decreased urine volume and hematuria.  Musculoskeletal: Negative for myalgias, neck pain and neck stiffness.  Neurological: Positive for dizziness. Negative for syncope, facial asymmetry, speech difficulty, weakness, light-headedness, numbness and headaches.     Physical Exam Triage  Vital Signs ED Triage Vitals  Enc Vitals Group     BP 01/26/19 1028 (!) 190/104     Pulse Rate 01/26/19 1028 98     Resp 01/26/19 1028 20     Temp 01/26/19 1028 98.4 F (36.9 C)     Temp Source 01/26/19 1028 Oral     SpO2 01/26/19 1028 100 %     Weight 01/26/19 1026 (!) 380 lb (172.4 kg)     Height --      Head Circumference --      Peak Flow --      Pain Score 01/26/19 1026 4     Pain Loc --      Pain Edu? --      Excl. in GC? --    No data found.  Updated Vital  Signs BP (!) 190/104 (BP Location: Right Arm)   Pulse 98   Temp 98.4 F (36.9 C) (Oral)   Resp 20   Wt (!) 380 lb (172.4 kg)   SpO2 100%   BMI 53.00 kg/m   Visual Acuity Right Eye Distance:   Left Eye Distance:   Bilateral Distance:    Right Eye Near:   Left Eye Near:    Bilateral Near:     Physical Exam Vitals signs and nursing note reviewed.  Constitutional:      Appearance: He is well-developed. He is obese.  HENT:     Head: Normocephalic and atraumatic.     Ears:     Comments: Bilateral canals appear slightly swollen but nonerythematous, small amount of debris present in the EACs, TMs opaque and appear dull, but nonerythematous    Mouth/Throat:     Comments: Oral mucosa pink and moist, no tonsillar enlargement or exudate. Posterior pharynx patent and nonerythematous, no uvula deviation or swelling. Normal phonation. Palate elevates symmetrically Eyes:     Extraocular Movements: Extraocular movements intact.     Conjunctiva/sclera: Conjunctivae normal.     Pupils: Pupils are equal, round, and reactive to light.     Comments: No nystagmus  Neck:     Musculoskeletal: Neck supple.  Cardiovascular:     Rate and Rhythm: Normal rate and regular rhythm.     Heart sounds: No murmur.  Pulmonary:     Effort: Pulmonary effort is normal. No respiratory distress.     Breath sounds: Normal breath sounds.     Comments: Breathing comfortably at rest, CTABL, no wheezing, rales or other adventitious sounds auscultated Abdominal:     Palpations: Abdomen is soft.     Tenderness: There is no abdominal tenderness.  Skin:    General: Skin is warm and dry.  Neurological:     General: No focal deficit present.     Mental Status: He is alert and oriented to person, place, and time. Mental status is at baseline.     Comments: Patient A&O x3, cranial nerves II-XII grossly intact, strength at shoulders, hips and knees 5/5, equal bilaterally, patellar reflex 1+ bilaterally.  Gait without  abnormality. Unable to perform Dix-Hallpike due to patient size and his discomfort, began to have dizziness with slight movement into lying position      UC Treatments / Results  Labs (all labs ordered are listed, but only abnormal results are displayed) Labs Reviewed - No data to display  EKG   Radiology No results found.  Procedures Procedures (including critical care time)  Medications Ordered in UC Medications - No data to display  Initial Impression / Assessment  and Plan / UC Course  I have reviewed the triage vital signs and the nursing notes.  Pertinent labs & imaging results that were available during my care of the patient were reviewed by me and considered in my medical decision making (see chart for details).  Clinical Course as of Jan 26 1104  Thu Jan 26, 2019  1059 172/97    [HW]    Clinical Course User Index [HW] Flavio Lindroth C, PA-C    Blood pressure rechecked prior to discharge, slightly improved, has not taken blood pressure medicines today.  Continue to monitor blood pressure and take medicines as prescribed.  Patient does appear to have possible eustachian tube dysfunction, possible early otitis media/externa.  Given appearance will initiate on antibiotics with Augmentin, will do trial of prednisone to help with inflammation within the ear as well as Flonase nasal spray.  Discussed Epley maneuver for vertigo as symptoms seem vertiginous.  No neuro deficits, do not suspect central cause of dizziness or underlying intracranial abnormality.  Will continue to monitor,Discussed strict return precautions. Patient verbalized understanding and is agreeable with plan.  Final Clinical Impressions(s) / UC Diagnoses   Final diagnoses:  Benign paroxysmal positional vertigo of left ear  Non-recurrent acute suppurative otitis media of left ear without spontaneous rupture of tympanic membrane  Dysfunction of left eustachian tube     Discharge Instructions      Begin augmentin twice daily for 1 week Flonase nasal spray 1-2 spray in each nostril daily for next 1-2 weeks Prednisone 20 mg twice daily for 4 days  Read attached on vertigo  Your blood pressure was elevated today in clinic. Please be sure to take blood pressure medications as prescribed. Please monitor your blood pressure at home or when you go to a [facility]/Gym. Please follow up with your primary care doctor to recheck blood pressure and discuss any need for medication changes.   Please go to Emergency Room if you start to experience severe headache, vision changes, decreased urine production, chest pain, shortness of breath, speech slurring, one sided weakness.     ED Prescriptions    Medication Sig Dispense Auth. Provider   omeprazole (PRILOSEC) 20 MG capsule Take 1 capsule (20 mg total) by mouth daily. 60 capsule Kemo Spruce C, PA-C   amoxicillin-clavulanate (AUGMENTIN) 875-125 MG tablet Take 1 tablet by mouth every 12 (twelve) hours for 7 days. 14 tablet Timoth Schara C, PA-C   fluticasone (FLONASE) 50 MCG/ACT nasal spray Place 1-2 sprays into both nostrils daily. 1 g Cindy Fullman C, PA-C   predniSONE (DELTASONE) 10 MG tablet Take 2 tablets (20 mg total) by mouth 2 (two) times daily with a meal for 4 days. 16 tablet Shakim Faith, [place] C, PA-C     Controlled Substance Prescriptions [place] Controlled Substance Registry consulted? Not Applicable   Lew DawesWieters, Jahden Schara C, [place]PA-C 01/26/19 1110

## 2019-01-26 NOTE — ED Triage Notes (Signed)
Pt states he was a left ear discomfort. Pt states when he laid down on his left side last night he started to get dizzy but when he got up it went away. Pt states he needs a refill on his acid reflux med.

## 2019-04-28 ENCOUNTER — Ambulatory Visit: Payer: Medicare Other | Admitting: Pulmonary Disease

## 2019-05-04 ENCOUNTER — Encounter: Payer: Self-pay | Admitting: Primary Care

## 2019-05-04 ENCOUNTER — Other Ambulatory Visit: Payer: Self-pay

## 2019-05-04 ENCOUNTER — Ambulatory Visit (INDEPENDENT_AMBULATORY_CARE_PROVIDER_SITE_OTHER): Payer: Medicare Other | Admitting: Primary Care

## 2019-05-04 DIAGNOSIS — G4733 Obstructive sleep apnea (adult) (pediatric): Secondary | ICD-10-CM

## 2019-05-04 MED ORDER — LISINOPRIL-HYDROCHLOROTHIAZIDE 20-12.5 MG PO TABS: 1.0000 | ORAL_TABLET | Freq: Every day | ORAL | 0 refills | Status: DC

## 2019-05-04 NOTE — Progress Notes (Signed)
Reviewed and agree with assessment/plan.   Raelyn Racette, MD [facility] 07/08/2016, 12:24 PM Pager:  [phone]  

## 2019-05-04 NOTE — Patient Instructions (Addendum)
Referral: Renew CPAP supplies with [facility]al [facility]  Primary care   Recommendations: Wear CPAP every night goal 4-6 hours  Do not drive if experiencing excessive daytime fatigue or somnolence  Rx: Refill BP medications 90 days with no refill  Follow-up 1 year with Dr. Halford [name] for OSA

## 2019-05-04 NOTE — Progress Notes (Signed)
Virtual Visit via Telephone Note  I connected with John [name] on 05/04/19 at 12:00 PM EDT by telephone and verified that I am speaking with the correct person using two identifiers.  Location: Patient: Home Provider: Office   I discussed the limitations, risks, security and privacy concerns of performing an evaluation and management service by telephone and the availability of in person appointments. I also discussed with the patient that there may be a patient responsible charge related to this service. The patient expressed understanding and agreed to proceed.   History of Present Illness: 53 year old male, never smoked. Past medical history significant for obstructive sleep apnea, hypertension, morbid obesity. Patient of Dr. Halford Chessman, last seen on 07/28/2017. Maintained on CPAP at 17cm h20.  05/04/2019 Patient contacted today for televisit annual follow-up for sleep apnea.  He is under percent compliant with CPAP, no issues with mask fit or pressure settings. He sleeps well through the night, occasionally waking up to use the restroom.  He reports less daytime fatigue and has more energy.  He does not have a current PCP or mental health provider. He needs a refill of his lisinopril-hydrochlorothiazide blood pressure medication.   [facility] download: 30/30 days; 100% > 4 hours Average usage 8 hours 68mins Pressure 17cm H20 AHI 0.4  Observations/Objective:  -No shortness of breath, wheezing or cough observed during phone conversation  TESTS: CPAP 08/14/14 >> CPAP 17 cm H2O >> AHI 3.3, no R. CPAP 10/06/14 to 11/04/14 >> used on 30 of 30 nights with average 7 hrs and 21 min.  Average AHI is 2.8 with CPAP 17 cm H2O.  Assessment and Plan:  OSA - 100% compliant with CPAP and reports benefit from use - No changes today - Pressure 17cm h20; AHI 0.4 - Continue to wear CPAP every night for 4-6 hours or more  - Advised not to drive if experiencing excessive daytime fatigue or somnolence    Hypertension - No current PCP - Needs refill lisinopril-HCTZ (90 days with no refill) - Referral to internal medicine  Anxiety - Referral to behavioral health for medication management   Follow Up Instructions:   - Follow-up in 1 year  I discussed the assessment and treatment plan with the patient. The patient was provided an opportunity to ask questions and all were answered. The patient agreed with the plan and demonstrated an understanding of the instructions.   The patient was advised to call back or seek an in-person evaluation if the symptoms worsen or if the condition fails to improve as anticipated.  I provided 20 minutes of non-face-to-face time during this encounter.   Martyn Ehrich, NP

## 2019-05-05 NOTE — Addendum Note (Signed)
Addended by: Vivia Ewing on: 05/05/2019 08:33 AM   Modules accepted: Orders

## 2019-05-15 ENCOUNTER — Telehealth: Payer: Self-pay | Admitting: Primary Care

## 2019-05-15 NOTE — Telephone Encounter (Signed)
Can you verify that patient has psychiatry, I referred him to mental health and they states he is established with Dr. Rosine Door

## 2019-05-15 NOTE — Telephone Encounter (Signed)
LMTCB

## 2019-05-15 NOTE — Telephone Encounter (Signed)
Patient returned phone call, he states Dr. Pecola Leisure office closed due to Covid. He states he still needs a mental health referral.   Referral message sent to Friend who sent original message making her aware this patient no longer see's Dr. Rosine Door and he does indeed need a appt. Will await f/u in referral message.   Nothing further needed at this time.

## 2019-05-29 ENCOUNTER — Ambulatory Visit: Payer: Medicare Other | Admitting: Family Medicine

## 2019-06-13 ENCOUNTER — Telehealth: Payer: Self-pay

## 2019-06-13 NOTE — Telephone Encounter (Signed)
Called patient to offer our help with hypertension by being apart of our htn clinic and pt agreed.

## 2019-06-28 ENCOUNTER — Other Ambulatory Visit: Payer: Self-pay

## 2019-06-28 ENCOUNTER — Ambulatory Visit: Payer: Medicare Other | Admitting: Cardiovascular Disease

## 2019-07-24 ENCOUNTER — Other Ambulatory Visit: Payer: Self-pay

## 2019-07-25 ENCOUNTER — Encounter: Payer: Self-pay | Admitting: Family Medicine

## 2019-07-25 ENCOUNTER — Ambulatory Visit (INDEPENDENT_AMBULATORY_CARE_PROVIDER_SITE_OTHER): Payer: Medicare Other | Admitting: Family Medicine

## 2019-07-25 VITALS — BP 138/82 | HR 120 | Resp 16 | Ht 71.0 in | Wt 383.0 lb

## 2019-07-25 DIAGNOSIS — Z23 Encounter for immunization: Secondary | ICD-10-CM | POA: Diagnosis not present

## 2019-07-25 DIAGNOSIS — R7309 Other abnormal glucose: Secondary | ICD-10-CM

## 2019-07-25 DIAGNOSIS — F419 Anxiety disorder, unspecified: Secondary | ICD-10-CM

## 2019-07-25 DIAGNOSIS — R000 Tachycardia, unspecified: Secondary | ICD-10-CM | POA: Diagnosis not present

## 2019-07-25 DIAGNOSIS — E118 Type 2 diabetes mellitus with unspecified complications: Secondary | ICD-10-CM

## 2019-07-25 DIAGNOSIS — F209 Schizophrenia, unspecified: Secondary | ICD-10-CM | POA: Diagnosis not present

## 2019-07-25 DIAGNOSIS — K429 Umbilical hernia without obstruction or gangrene: Secondary | ICD-10-CM

## 2019-07-25 DIAGNOSIS — I10 Essential (primary) hypertension: Secondary | ICD-10-CM

## 2019-07-25 DIAGNOSIS — E785 Hyperlipidemia, unspecified: Secondary | ICD-10-CM

## 2019-07-25 DIAGNOSIS — G4733 Obstructive sleep apnea (adult) (pediatric): Secondary | ICD-10-CM

## 2019-07-25 LAB — LIPID PANEL
Cholesterol: 183 mg/dL (ref 0–200)
HDL: 43.9 mg/dL (ref >39.00)
LDL Cholesterol: 119 mg/dL — ABNORMAL HIGH (ref 0–99)
NonHDL: 139.14
Total CHOL/HDL Ratio: 4
Triglycerides: 102 mg/dL (ref 0.0–149.0)
VLDL: 20.4 mg/dL (ref 0.0–40.0)

## 2019-07-25 LAB — COMPREHENSIVE METABOLIC PANEL
ALT: 21 U/L (ref 0–53)
AST: 15 U/L (ref 0–37)
Albumin: 3.6 g/dL (ref 3.5–5.2)
Alkaline Phosphatase: 80 U/L (ref 39–117)
BUN: 17 mg/dL (ref 6–23)
CO2: 25 mEq/L (ref 19–32)
Calcium: 9.7 mg/dL (ref 8.4–10.5)
Chloride: 95 mEq/L — ABNORMAL LOW (ref 96–112)
Creatinine, Ser: 1.12 mg/dL (ref 0.40–1.50)
GFR: 83.27 mL/min (ref >60.00)
Glucose, Bld: 406 mg/dL — ABNORMAL HIGH (ref 70–99)
Potassium: 3.9 mEq/L (ref 3.5–5.1)
Sodium: 131 mEq/L — ABNORMAL LOW (ref 135–145)
Total Bilirubin: 0.4 mg/dL (ref 0.2–1.2)
Total Protein: 8.3 g/dL (ref 6.0–8.3)

## 2019-07-25 LAB — HEMOGLOBIN A1C: Hgb A1c MFr Bld: 13.4 % — ABNORMAL HIGH (ref 4.6–6.5)

## 2019-07-25 MED ORDER — LISINOPRIL-HYDROCHLOROTHIAZIDE 20-12.5 MG PO TABS
1.0000 | ORAL_TABLET | Freq: Every day | ORAL | 1 refills | Status: DC
Start: 2019-07-25 — End: 2020-02-28

## 2019-07-25 MED ORDER — RISPERIDONE 2 MG PO TABS: 2.0000 mg | ORAL_TABLET | Freq: Every day | ORAL | 2 refills | Status: DC

## 2019-07-25 NOTE — Progress Notes (Signed)
HPI:   John [name] is a 54 y.o. male, who is here today to establish care.  Former PCP: N/A Last preventive routine visit: Years ago.  Chronic medical problems: OSA,HTN,and HLD among some. On disability due to psychiatric disorder.  -Schizophrenia, he followed with psychiatrist, Dolores Frame.According to pt,clinic was closed due to COVID 19. He is requesting a Rx for meds.  He has been on Lorazepam 0.5 mg bid for anxiety,which he describes as "bad." Negative for depression. He has not taking his risperidone and Clonidin for about a month.  He is reporting visual and auditory hallucinations, voices saying "a lot of things" and seeing people around the house.  He denies voices telling him to hurt himself or hurt people. Problem was well controlled with risperidone 1 mg twice daily.  He lives with his girlfriend.  OSA: he wears his CPAP as instructed.  Follows with Dr Craige Cotta.  No known Hx of HLD or DM II. -His glucose has been mildly elevated in the past. Denies polydipsia,polyuria, or polyphagia.  -He does not exercise regularly, he just started walking more and being more active. Recently increased vegetable intake but in general he has not been consistent with following a healthful diet. Since dietary changes he has lost some weight, states that he has been as heavy as 440 Lb.  -Umbilical hernia for years, he is able to "push it down." Negative for pain,N/V,or blood in stool.  -HTN:Dx'ed 10+ years ago. He is not checking BP at home. Denies severe/frequent headache, visual changes, claudication, focal weakness, or edema.  Noted tachycardia,he denies Hx and attributes it to nerveus here in the office. He denies prior Hx.  Denies CP,SOB,palpitations,orthopnea,or PND.  Review of Systems  Constitutional: Negative for activity change, appetite change, fatigue and fever.  HENT: Negative for mouth sores, nosebleeds and sore throat.   Eyes: Negative for redness  and visual disturbance.  Respiratory: Negative for cough and wheezing.   Gastrointestinal: Negative for abdominal pain.  Endocrine: Negative for cold intolerance and heat intolerance.  Genitourinary: Negative for decreased urine volume, dysuria and hematuria.  Musculoskeletal: Negative for gait problem and myalgias.  Skin: Negative for rash and wound.  Neurological: Negative for dizziness, weakness, numbness and headaches.  Psychiatric/Behavioral: Negative for confusion. The patient is nervous/anxious.   Rest see pertinent positives and negatives per HPI.   Current Outpatient Medications on File Prior to Visit  Medication Sig Dispense Refill  . ALPRAZolam (XANAX) 0.5 MG tablet Take 0.5 mg by mouth 2 (two) times daily as needed.    [name] CLONIDINE HCL PO Take by mouth.     No current facility-administered medications on file prior to visit.    Past Medical History:  Diagnosis Date  . Hypertension   . Sleep apnea    Allergies  Allergen Reactions  . Aspirin Nausea Only    History reviewed. No pertinent family history.  Social History   Socioeconomic History  . Marital status: Single    Spouse name: Not on file  . Number of children: Not on file  . Years of education: Not on file  . Highest education level: Not on file  Occupational History  . Not on file  Tobacco Use  . Smoking status: Never Smoker  . Smokeless tobacco: Never Used  Substance and Sexual Activity  . Alcohol use: No    Comment: none x 1 month  . Drug use: Yes    Types: Marijuana  . Sexual activity: Not on  file  Other Topics Concern  . Not on file  Social History Narrative  . Not on file   Social Determinants of Health   Financial Resource Strain:   . Difficulty of Paying Living Expenses: Not on file  Food Insecurity:   . Worried About Programme researcher, broadcasting/film/video in the Last Year: Not on file  . Ran Out of Food in the Last Year: Not on file  Transportation Needs:   . Lack of Transportation (Medical): Not  on file  . Lack of Transportation (Non-Medical): Not on file  Physical Activity:   . Days of Exercise per Week: Not on file  . Minutes of Exercise per Session: Not on file  Stress:   . Feeling of Stress : Not on file  Social Connections:   . Frequency of Communication with Friends and Family: Not on file  . Frequency of Social Gatherings with Friends and Family: Not on file  . Attends Religious Services: Not on file  . Active Member of Clubs or Organizations: Not on file  . Attends Banker Meetings: Not on file  . Marital Status: Not on file    Vitals:   07/25/19 0902  BP: 138/82  Pulse: (!) 120  Resp: 16  SpO2: 96%    Body mass index is 53.42 kg/m.  Physical Exam  Nursing note and vitals reviewed. Constitutional: He is oriented to person, place, and time. He appears well-developed. No distress.  HENT:  Head: Normocephalic and atraumatic.  Mouth/Throat: Oropharynx is clear and moist and mucous membranes are normal.  Eyes: Pupils are equal, round, and reactive to light. Conjunctivae are normal.  Cardiovascular: Regular rhythm. Tachycardia present.  No murmur heard. Pulses:      Dorsalis pedis pulses are 2+ on the right side and 2+ on the left side.  Respiratory: Effort normal and breath sounds normal. No respiratory distress.  GI: Soft. He exhibits no mass. There is no hepatomegaly. There is no abdominal tenderness. A hernia is present.    Musculoskeletal:        General: No edema.  Lymphadenopathy:    He has no cervical adenopathy.  Neurological: He is alert and oriented to person, place, and time. He has normal strength. No cranial nerve deficit. Gait normal.  Skin: Skin is warm. No rash noted. No erythema.  Psychiatric: He has a normal mood and affect.  Well groomed, good eye contact.    ASSESSMENT AND PLAN:  Mr. Delaney was seen today for establish care.  Diagnoses and all orders for this visit:  Orders Placed This Encounter  Procedures  . Flu  Vaccine QUAD 6+ mos PF IM (Fluarix Quad PF)  . CMP  . Hemoglobin A1c  . Lipid panel  . EKG 12-Lead   Lab Results  Component Value Date   CREATININE 1.12 07/25/2019   BUN 17 07/25/2019   NA 131 (L) 07/25/2019   K 3.9 07/25/2019   CL 95 (L) 07/25/2019   CO2 25 07/25/2019   Lab Results  Component Value Date   HGBA1C 13.4 (H) 07/25/2019   Lab Results  Component Value Date   ALT 21 07/25/2019   AST 15 07/25/2019   ALKPHOS 80 07/25/2019   BILITOT 0.4 07/25/2019   Lab Results  Component Value Date   CHOL 183 07/25/2019   HDL 43.90 07/25/2019   LDLCALC 119 (H) 07/25/2019   TRIG 102.0 07/25/2019   CHOLHDL 4 07/25/2019    High glucose level Encouraged to be consistent with  a healthy lifestyle for primary prevention of diabetes. Further recommendation will be given according to lab results.  Essential hypertension Otherwise BP today is adequately controlled            . Recommend monitoring BP at home. Continue low-salt diet. No changes in current management.  -     lisinopril-hydrochlorothiazide (ZESTORETIC) 20-12.5 MG tablet; Take 1 tablet by mouth daily.  Schizophrenia, unspecified type ([place]) A list of psychiatrists in the area was provided, so he can call and arrange appointment. Risperidone sent to his pharmacy. Instructed about warning signs.  -     risperiDONE (RISPERDAL) 2 MG tablet; Take 1 tablet (2 mg total) by mouth daily.  OSA (obstructive sleep apnea) Reporting compliance with CPAP. Following with Dr. Irene Pap.  Morbid obesity ([place]) We discussed benefits of wt loss as well as adverse effects of obesity. Consistency with healthy diet and physical activity recommended.  Sinus tachycardia Asymptomatic. EKG today with sinus tachycardia,normal axis,unsp T wave abnormalities. No other EKG for comparison. Instructed about warning signs. I taught him how to check his pulse, recommend monitoring HR daily.  -     EKG 12-Lead  Hyperlipidemia, unspecified  hyperlipidemia type For now recommend low-fat diet. Further recommendation will be given according to lipid panel results as well as 10 years CVD risk score.  -     Lipid panel  Umbilical hernia without obstruction and without gangrene Recommend weight loss. Instructed about warning signs. For now he is no interested in surgical management.  Need for influenza vaccination -     Flu Vaccine QUAD 6+ mos PF IM (Fluarix Quad PF)  Anxiety disorder, unspecified type I sent a 30-day supply of alprazolam 0.5 mg to continue twice daily as needed. He needs to establish with psychiatrist.  Type 2 diabetes mellitus with unspecified complications ([place]) New Dx. Will arrange nutrition and diabetes education appt.   Return in about 4 weeks (around 08/22/2019).     John Baldyga G. Martinique, MD  [facility] [place] office.

## 2019-07-25 NOTE — Patient Instructions (Addendum)
A few things to remember from today's visit:   Essential hypertension - Plan: CMP  Schizophrenia, unspecified type (HCC)  OSA (obstructive sleep apnea)  Morbid obesity (HCC)  High glucose level - Plan: Hemoglobin A1c  Sinus tachycardia - Plan: EKG 12-Lead  Hyperlipidemia, unspecified hyperlipidemia type - Plan: Lipid panel PSYCHIATRIC OFFICES AND PSYCHIATRISTS IN THE AREA  This is a list of options around [place], [place] that you can call and arrange an appointment.  -[facility] [phone] -[facility] [phone] - [facility] [phone] -[facility] [phone]  Dr Annabell Sabal [phone] Dr Marguerita Beards, MD [phone] Dr Milagros Evener, MD [phone])   Dr Jacqulynn Cadet. Bernardo Heater, MD [phone] Dr Madie Reno A. [facility]  Dr Andee Poles, MD [phone])  Dr Mar Daring, MD [phone]  Dr Laverna Peace [phone]   Please be sure medication list is accurate. If a new problem present, please set up appointment sooner than planned today.

## 2019-07-26 DIAGNOSIS — E118 Type 2 diabetes mellitus with unspecified complications: Secondary | ICD-10-CM | POA: Insufficient documentation

## 2019-07-26 MED ORDER — METFORMIN HCL 500 MG PO TABS: 500.0000 mg | ORAL_TABLET | Freq: Two times a day (BID) | ORAL | 0 refills | Status: DC

## 2019-07-26 MED ORDER — INSULIN GLARGINE 100 UNIT/ML SOLOSTAR PEN: 20.0000 [IU] | PEN_INJECTOR | Freq: Every day | SUBCUTANEOUS | 1 refills | Status: DC

## 2019-07-31 ENCOUNTER — Other Ambulatory Visit: Payer: Self-pay

## 2019-07-31 ENCOUNTER — Telehealth: Payer: Self-pay | Admitting: Family Medicine

## 2019-07-31 DIAGNOSIS — E118 Type 2 diabetes mellitus with unspecified complications: Secondary | ICD-10-CM

## 2019-07-31 DIAGNOSIS — F419 Anxiety disorder, unspecified: Secondary | ICD-10-CM

## 2019-07-31 MED ORDER — CLONIDINE HCL 0.1 MG PO TABS: 0.1000 mg | ORAL_TABLET | Freq: Two times a day (BID) | ORAL | 0 refills | Status: DC

## 2019-07-31 MED ORDER — METFORMIN HCL 500 MG PO TABS: 500.0000 mg | ORAL_TABLET | Freq: Two times a day (BID) | ORAL | 0 refills | Status: DC

## 2019-07-31 MED ORDER — ONETOUCH ULTRA 2 W/DEVICE KIT: PACK | 0 refills | Status: DC

## 2019-07-31 MED ORDER — ONETOUCH ULTRA VI STRP: ORAL_STRIP | 12 refills | Status: DC

## 2019-07-31 MED ORDER — INSULIN GLARGINE 100 UNIT/ML SOLOSTAR PEN: 20.0000 [IU] | PEN_INJECTOR | Freq: Every day | SUBCUTANEOUS | 1 refills | Status: DC

## 2019-07-31 MED ORDER — ONETOUCH ULTRASOFT LANCETS MISC: 12 refills | Status: DC

## 2019-07-31 MED ORDER — PEN NEEDLES 33G X 4 MM MISC: 1.0000 | Freq: Every day | 3 refills | Status: DC

## 2019-07-31 NOTE — Telephone Encounter (Signed)
Pt calling back for labs. Please advise  Copied from CRM [id number]. Topic: Quick Communication - Lab Results (Clinic Use ONLY) >> Jul 28, 2019 12:47 PM Kathreen Devoid, CMA wrote: Molli Knock for triage nurse to discuss with pt.

## 2019-08-01 ENCOUNTER — Other Ambulatory Visit: Payer: Self-pay

## 2019-08-01 MED ORDER — ACCU-CHEK SOFT TOUCH LANCETS MISC: 12 refills | Status: DC

## 2019-08-01 MED ORDER — ALPRAZOLAM 0.5 MG PO TABS: 0.5000 mg | ORAL_TABLET | Freq: Two times a day (BID) | ORAL | 0 refills | Status: DC | PRN

## 2019-08-01 MED ORDER — ACCU-CHEK AVIVA PLUS VI STRP: ORAL_STRIP | 12 refills | Status: DC

## 2019-08-01 MED ORDER — ACCU-CHEK AVIVA PLUS W/DEVICE KIT: PACK | 0 refills | Status: DC

## 2019-08-04 ENCOUNTER — Other Ambulatory Visit: Payer: Self-pay

## 2019-08-04 ENCOUNTER — Telehealth: Payer: Self-pay | Admitting: Family Medicine

## 2019-08-04 MED ORDER — ACCU-CHEK AVIVA PLUS W/DEVICE KIT: PACK | 0 refills | Status: DC

## 2019-08-04 MED ORDER — ACCU-CHEK AVIVA PLUS VI STRP: ORAL_STRIP | 12 refills | Status: DC

## 2019-08-04 MED ORDER — ACCU-CHEK SOFT TOUCH LANCETS MISC: 12 refills | Status: DC

## 2019-08-09 ENCOUNTER — Telehealth: Payer: Self-pay | Admitting: Family Medicine

## 2019-08-09 NOTE — Telephone Encounter (Signed)
Pt would like to know how long does he need to take metFORMIN (GLUCOPHAGE) 500 MG tablet? Thanks  Pt's number [phone]

## 2019-08-09 NOTE — Telephone Encounter (Signed)
I tried contacting pt, unable to leave voicemail. Pt needs to stay on medication until his A1C is in normal range and maybe longer.

## 2019-08-09 NOTE — Telephone Encounter (Signed)
I spoke with pt. We went over all his questions & he verbalized understanding. Diabetes follow up scheduled for March.

## 2019-09-19 ENCOUNTER — Other Ambulatory Visit: Payer: Self-pay | Admitting: *Deleted

## 2019-09-19 DIAGNOSIS — F419 Anxiety disorder, unspecified: Secondary | ICD-10-CM

## 2019-09-19 DIAGNOSIS — F209 Schizophrenia, unspecified: Secondary | ICD-10-CM

## 2019-09-19 NOTE — Telephone Encounter (Signed)
Patient called to reschedule appointment for 10/06/19 because he stated that he would not be in town. Patient rescheduled for 10/20/19. Patient wanted to know if he could get a refill on Risperdal and Xanax.

## 2019-09-19 NOTE — Addendum Note (Signed)
Addended by: Kathreen Devoid on: 09/19/2019 02:40 PM   Modules accepted: Orders

## 2019-09-22 NOTE — Addendum Note (Signed)
Addended by: Kathreen Devoid on: 09/22/2019 04:22 PM   Modules accepted: Orders

## 2019-09-22 NOTE — Telephone Encounter (Signed)
He was instructed to establish with psychiatrist.I gave him one time Rx for both. Thanks, BJ

## 2019-10-06 ENCOUNTER — Ambulatory Visit: Payer: Medicare Other | Attending: Internal Medicine

## 2019-10-06 ENCOUNTER — Ambulatory Visit: Payer: Medicare Other | Admitting: Family Medicine

## 2019-10-06 DIAGNOSIS — Z23 Encounter for immunization: Secondary | ICD-10-CM

## 2019-10-06 NOTE — Progress Notes (Signed)
   Covid-19 Vaccination Clinic  Name:  John [name]    MRN: [id number] DOB: 02-Oct-1965  10/06/2019  [name] was observed post Covid-19 immunization for 15 minutes without incident. He was provided with Vaccine Information Sheet and instruction to access the V-Safe system.   [name] was instructed to call 911 with any severe reactions post vaccine: [name] Difficulty breathing  . Swelling of face and throat  . A fast heartbeat  . A bad rash all over body  . Dizziness and weakness   Immunizations Administered    Name Date Dose VIS Date Route   Pfizer COVID-19 Vaccine 10/06/2019  1:09 PM 0.3 mL 06/23/2019 Intramuscular   Manufacturer: [employer], [employer]   Lot: [id number]   NDC: [address]-0

## 2019-10-18 ENCOUNTER — Telehealth (INDEPENDENT_AMBULATORY_CARE_PROVIDER_SITE_OTHER): Payer: Medicare Other | Admitting: Family Medicine

## 2019-10-18 ENCOUNTER — Encounter: Payer: Self-pay | Admitting: Family Medicine

## 2019-10-18 VITALS — Ht 71.0 in

## 2019-10-18 DIAGNOSIS — E785 Hyperlipidemia, unspecified: Secondary | ICD-10-CM

## 2019-10-18 DIAGNOSIS — S8991XA Unspecified injury of right lower leg, initial encounter: Secondary | ICD-10-CM | POA: Diagnosis not present

## 2019-10-18 DIAGNOSIS — I10 Essential (primary) hypertension: Secondary | ICD-10-CM

## 2019-10-18 DIAGNOSIS — E1169 Type 2 diabetes mellitus with other specified complication: Secondary | ICD-10-CM | POA: Diagnosis not present

## 2019-10-18 DIAGNOSIS — E118 Type 2 diabetes mellitus with unspecified complications: Secondary | ICD-10-CM

## 2019-10-18 DIAGNOSIS — F209 Schizophrenia, unspecified: Secondary | ICD-10-CM

## 2019-10-18 MED ORDER — LOVASTATIN 20 MG PO TABS: 20.0000 mg | ORAL_TABLET | Freq: Every day | ORAL | 3 refills | Status: DC

## 2019-10-18 MED ORDER — DICLOFENAC SODIUM 1 % EX GEL: 2.0000 g | Freq: Four times a day (QID) | CUTANEOUS | 1 refills | Status: DC

## 2019-10-18 NOTE — Assessment & Plan Note (Signed)
Instructed to establish with psychiatrist.

## 2019-10-18 NOTE — Assessment & Plan Note (Signed)
HgA1C was not at goal. Lab appt will be arranged. Continue Lantus 20 units daily and Metformin 500 mg twice daily. Regular exercise and healthy diet with avoidance of added sugar food intake is an important part of treatment and recommended. Annual eye exam and foot care recommended. F/U in 5 months

## 2019-10-18 NOTE — Assessment & Plan Note (Signed)
He is not checking BP. Continue lisinopril-HCTZ 20-12.5 mg daily. Low-salt diet recommended. Weight loss will help.

## 2019-10-18 NOTE — Assessment & Plan Note (Signed)
He has already lost some weight. We discussed benefits of wt loss as well as adverse effects of obesity. Consistency with healthy diet and physical activity recommended.

## 2019-10-18 NOTE — Progress Notes (Signed)
Virtual Visit via Video Note   I connected with John [name] on 10/18/19 by a video enabled telemedicine application and verified that I am speaking with the correct person using two identifiers.  Location patient: home Location provider:work or home office Persons participating in the virtual visit: patient, provider  I discussed the limitations of evaluation and management by telemedicine and the availability of in person appointments. The patient expressed understanding and agreed to proceed.   HPI: John [name] is a 54 year old male who is being seen today (video visit) to follow-up on chronic medical problems. Since his last visit he has had first dose of COVID-19 vaccine.  DM2: Diagnosed on 07/25/2019. BS: He denies checking fasting glucose, postprandial glucose 150-200s.  He states that initially he was having 300s. Denies abdominal pain, nausea,vomiting, polydipsia,polyuria, or polyphagia.  Lab Results  Component Value Date   HGBA1C 13.4 (H) 07/25/2019   Hypertension: Dx's 10+ years ago. Currently he is on lisinopril-HCTZ 20-12.5 mg daily. Negative for severe/frequent headache, visual changes, chest pain, dyspnea, palpitation, claudication, focal weakness, or edema. He is not checking BP at home.  Lab Results  Component Value Date   CREATININE 1.12 07/25/2019   BUN 17 07/25/2019   NA 131 (L) 07/25/2019   K 3.9 07/25/2019   CL 95 (L) 07/25/2019   CO2 25 07/25/2019   HLD: He is not on pharmacologic treatment.  Lab Results  Component Value Date   CHOL 183 07/25/2019   HDL 43.90 07/25/2019   LDLCALC 119 (H) 07/25/2019   TRIG 102.0 07/25/2019   CHOLHDL 4 07/25/2019   He has made some changes in his diet and states that his family has noted wt loss. He went from 5X size to 3X. He has not been exercising regularly.  Today he is also complaining about right knee "bad" pain that is started 3 to 4 days ago after a fall. When asked about mechanism of injury, he states that he  was being "clumsy." Pain is exacerbated by standing/walking. Alleviated by rest.  Mild  Knee edema, negative for erythema or limitation of range of motion. He has not taken OTC analgesics.  His schizophrenia and anxiety, he has not established with psychiatrist as instructed.  ROS: See pertinent positives and negatives per HPI.  Past Medical History:  Diagnosis Date  . Hypertension   . Sleep apnea     Past Surgical History:  Procedure Laterality Date  . ANKLE SURGERY    . APPENDECTOMY      History reviewed. No pertinent family history.  Social History   Socioeconomic History  . Marital status: Single    Spouse name: Not on file  . Number of children: Not on file  . Years of education: Not on file  . Highest education level: Not on file  Occupational History  . Not on file  Tobacco Use  . Smoking status: Never Smoker  . Smokeless tobacco: Never Used  Substance and Sexual Activity  . Alcohol use: No    Comment: none x 1 month  . Drug use: Yes    Types: Marijuana  . Sexual activity: Not on file  Other Topics Concern  . Not on file  Social History Narrative  . Not on file   Social Determinants of Health   Financial Resource Strain:   . Difficulty of Paying Living Expenses:   Food Insecurity:   . Worried About Charity fundraiser in the Last Year:   . [place] in the  Last Year:   Transportation Needs:   . Film/video editor (Medical):   [name] Lack of Transportation (Non-Medical):   Physical Activity:   . Days of Exercise per Week:   . Minutes of Exercise per Session:   Stress:   . Feeling of Stress :   Social Connections:   . Frequency of Communication with Friends and Family:   . Frequency of Social Gatherings with Friends and Family:   . Attends Religious Services:   . Active Member of Clubs or Organizations:   . Attends Archivist Meetings:   [name] Marital Status:   Intimate Partner Violence:   . Fear of Current or Ex-Partner:   .  Emotionally Abused:   [name] Physically Abused:   . Sexually Abused:     Current Outpatient Medications:  .  ALPRAZolam (XANAX) 0.5 MG tablet, Take 1 tablet (0.5 mg total) by mouth 2 (two) times daily as needed., Disp: 60 tablet, Rfl: 0 .  BD PEN NEEDLE NANO 2ND GEN 32G X 4 MM MISC, , Disp: , Rfl:  .  Blood Glucose Monitoring Suppl (ACCU-CHEK AVIVA PLUS) w/Device KIT, Use to check blood sugars 1-2 times daily., Disp: 1 kit, Rfl: 0 .  cloNIDine (CATAPRES) 0.1 MG tablet, Take 1 tablet (0.1 mg total) by mouth 2 (two) times daily., Disp: 180 tablet, Rfl: 0 .  glucose blood (ACCU-CHEK AVIVA PLUS) test strip, Use to test blood sugars 1-2 times daily., Disp: 100 each, Rfl: 12 .  Insulin Glargine (LANTUS) 100 UNIT/ML Solostar Pen, Inject 20 Units into the skin daily., Disp: 15 mL, Rfl: 1 .  Insulin Pen Needle (PEN NEEDLES) 33G X 4 MM MISC, 1 each by Does not apply route daily. To use with insulin pen., Disp: 100 each, Rfl: 3 .  Lancets (ACCU-CHEK SOFT TOUCH) lancets, Use to test blood sugars 1-2 times daily., Disp: 100 each, Rfl: 12 .  lisinopril-hydrochlorothiazide (ZESTORETIC) 20-12.5 MG tablet, Take 1 tablet by mouth daily., Disp: 90 tablet, Rfl: 1 .  metFORMIN (GLUCOPHAGE) 500 MG tablet, Take 1 tablet (500 mg total) by mouth 2 (two) times daily with a meal., Disp: 180 tablet, Rfl: 0 .  risperiDONE (RISPERDAL) 2 MG tablet, Take 1 tablet (2 mg total) by mouth daily., Disp: 30 tablet, Rfl: 2 .  diclofenac Sodium (VOLTAREN) 1 % GEL, Apply 2 g topically 4 (four) times daily., Disp: 2 g, Rfl: 1 .  lovastatin (MEVACOR) 20 MG tablet, Take 1 tablet (20 mg total) by mouth at bedtime., Disp: 90 tablet, Rfl: 3  EXAM:  VITALS per patient if applicable:Ht <[id number]>_5<\/[id number]>_8  (1.803 m)   BMI 53.42 kg/m   GENERAL: alert, oriented, appears well and in no acute distress  HEENT: atraumatic, conjunctiva clear, no obvious abnormalities on inspection.  NECK: normal movements of the head and neck  LUNGS: on inspection no signs  of respiratory distress, breathing rate appears normal, no obvious gross SOB, gasping or wheezing  CV: no obvious cyanosis  MS: moves all visible extremities without noticeable abnormality. Right knee mild effusion,no deformity or erythema appreciated. ROM seems to be adequate.  PSYCH/NEURO: pleasant and cooperative, no obvious depression or anxiety, speech and thought processing grossly intact  ASSESSMENT AND PLAN:  Discussed the following assessment and plan: Orders Placed This Encounter  Procedures  . Basic metabolic panel  . Microalbumin / creatinine urine ratio  . Hemoglobin A1c  . Fructosamine    Injury of right knee, initial encounter I do not think imaging is needed at this  time. Tylenol 500 mg 2-3 times per day may help. Topical Voltaren 3-4 times per day as needed recommended. If problem is persistent we can arrange plain imaging. Fall precautions discussed.  Type 2 diabetes mellitus with unspecified complications ([place]) ION6E was not at goal. Lab appt will be arranged. Continue Lantus 20 units daily and Metformin 500 mg twice daily. Regular exercise and healthy diet with avoidance of added sugar food intake is an important part of treatment and recommended. Annual eye exam and foot care recommended. F/U in 5 months   Hyperlipidemia associated with type 2 diabetes mellitus ([place]) We discussed benefits of statins. He agrees with starting lovastatin 20 mg daily.  Hypertension He is not checking BP. Continue lisinopril-HCTZ 20-12.5 mg daily. Low-salt diet recommended. Weight loss will help.  Schizophrenia ([place]) Instructed to establish with psychiatrist.  Morbid obesity ([place]) He has already lost some weight. We discussed benefits of wt loss as well as adverse effects of obesity. Consistency with healthy diet and physical activity recommended.   I discussed the assessment and treatment plan with the patient. Ms Strupp was provided an opportunity to ask questions  and all were answered. He agreed with the plan and demonstrated an understanding of the instructions.   Return in about 5 months (around 03/08/2020) for DM ,HTN,wt.. Non fasting labs on 11/01/19.[name]    Xolani Degracia Martinique, MD

## 2019-10-18 NOTE — Assessment & Plan Note (Signed)
We discussed benefits of statins. He agrees with starting lovastatin 20 mg daily.

## 2019-10-19 ENCOUNTER — Other Ambulatory Visit: Payer: Self-pay | Admitting: Family Medicine

## 2019-10-19 DIAGNOSIS — F419 Anxiety disorder, unspecified: Secondary | ICD-10-CM

## 2019-10-19 DIAGNOSIS — E118 Type 2 diabetes mellitus with unspecified complications: Secondary | ICD-10-CM

## 2019-10-19 MED ORDER — METFORMIN HCL 500 MG PO TABS: 500.0000 mg | ORAL_TABLET | Freq: Two times a day (BID) | ORAL | 0 refills | Status: DC

## 2019-10-20 ENCOUNTER — Telehealth: Payer: Self-pay | Admitting: Family Medicine

## 2019-10-20 ENCOUNTER — Ambulatory Visit: Payer: Medicare Other | Admitting: Family Medicine

## 2019-10-20 MED ORDER — ALPRAZOLAM 0.5 MG PO TABS: 0.5000 mg | ORAL_TABLET | Freq: Two times a day (BID) | ORAL | 0 refills | Status: DC | PRN

## 2019-10-20 NOTE — Telephone Encounter (Signed)
LVM for appt Return in about 5 months (around 03/08/2020) for DM ,HTN,wt.. Non fasting labs on 11/01/19.[name]

## 2019-10-23 NOTE — Telephone Encounter (Signed)
4/12 LVM to set up 5 month f/u

## 2019-10-25 ENCOUNTER — Other Ambulatory Visit: Payer: Self-pay | Admitting: Family Medicine

## 2019-10-30 ENCOUNTER — Other Ambulatory Visit: Payer: Self-pay | Admitting: Family Medicine

## 2019-10-30 DIAGNOSIS — E118 Type 2 diabetes mellitus with unspecified complications: Secondary | ICD-10-CM

## 2019-10-31 ENCOUNTER — Ambulatory Visit: Payer: Medicare Other | Attending: Internal Medicine

## 2019-10-31 DIAGNOSIS — Z23 Encounter for immunization: Secondary | ICD-10-CM

## 2019-10-31 NOTE — Progress Notes (Signed)
   Covid-19 Vaccination Clinic  Name:  John [name]    MRN: [id number] DOB: [birth year]  10/31/2019  Mr. [name] was observed post Covid-19 immunization for 15 minutes without incident. He was provided with Vaccine Information Sheet and instruction to access the V-Safe system.   Mr. [name] was instructed to call 911 with any severe reactions post vaccine: [name] Difficulty breathing  . Swelling of face and throat  . A fast heartbeat  . A bad rash all over body  . Dizziness and weakness   Immunizations Administered    Name Date Dose VIS Date Route   Pfizer COVID-19 Vaccine 10/31/2019  3:34 PM 0.3 mL 09/06/2018 Intramuscular   Manufacturer: [employer], [employer]   Lot: [id number]   NDC: [address]-1

## 2019-12-04 ENCOUNTER — Encounter: Payer: Self-pay | Admitting: Acute Care

## 2019-12-04 ENCOUNTER — Other Ambulatory Visit: Payer: Self-pay

## 2019-12-04 ENCOUNTER — Ambulatory Visit (INDEPENDENT_AMBULATORY_CARE_PROVIDER_SITE_OTHER): Payer: Medicare Other | Admitting: Acute Care

## 2019-12-04 DIAGNOSIS — G4733 Obstructive sleep apnea (adult) (pediatric): Secondary | ICD-10-CM | POA: Diagnosis not present

## 2019-12-04 DIAGNOSIS — F419 Anxiety disorder, unspecified: Secondary | ICD-10-CM

## 2019-12-04 DIAGNOSIS — I10 Essential (primary) hypertension: Secondary | ICD-10-CM

## 2019-12-04 DIAGNOSIS — Z9989 Dependence on other enabling machines and devices: Secondary | ICD-10-CM

## 2019-12-04 NOTE — Patient Instructions (Addendum)
Congratulations on your excellent compliane with your CPAP. We will order a new device as your CPAP machine is > 53 years old. Continue on CPAP at bedtime. You appear to be benefiting from the treatment  Goal is to wear for at least 6 hours each night for maximal clinical benefit. Continue to work on weight loss, as the link between excess weight  and sleep apnea is well established.   Remember to establish a good bedtime routine, and work on sleep hygiene.  Limit daytime naps , avoid stimulants such as caffeine and nicotine close to bedtime, exercise daily to promote sleep quality, avoid heavy , spicy, fried , or rich foods before bed. Ensure adequate exposure to natural light during the day,establish a relaxing bedtime routine with a pleasant sleep environment ( Bedroom between 60 and 67 degrees, turn off bright lights , TV or device screens screens , consider black out curtains or white noise machines) Do not drive if sleepy. Remember to clean mask, tubing, filter, and reservoir once weekly with soapy water.    Follow up with  PCP for treatment of HTN and anxiety  Follow up with Dr. Craige Cotta or NP in 3 months after you receive your new device with down load Please call if you need [place] sooner.

## 2019-12-04 NOTE — Progress Notes (Signed)
Reviewed and agree with assessment/plan.   Coralyn Helling, MD [facility] Pulmonary/Critical Care 12/04/2019, 3:39 PM Pager:  [phone]

## 2019-12-04 NOTE — Addendum Note (Signed)
Addended by: Cydney Ok on: 12/04/2019 09:42 AM   Modules accepted: Orders

## 2019-12-04 NOTE — Progress Notes (Addendum)
Virtual Visit via Telephone Note  I connected with John [name] on 12/04/19 at  9:00 AM EDT by telephone and verified that I am speaking with the correct person using two identifiers.  Location: Patient: Home Provider: Working remotely from home   I discussed the limitations, risks, security and privacy concerns of performing an evaluation and management service by telephone and the availability of in person appointments. I also discussed with the patient that there may be a patient responsible charge related to this service. The patient expressed understanding and agreed to proceed.  Synopsis 54 year old male, never smoked. Past medical history significant for obstructive sleep apnea, hypertension, morbid obesity. Patient of Dr. Halford Chessman, last seen on 07/28/2017. Maintained on CPAP at 17cm h20  History of Present Illness: Pt. Presents for follow up of CPAP. He has been 100 % compliant with his CPAP use. He states he has been doing well with his machine.He is requesting a new device as his is > 16 years old. He states he has been sleeping through the night. No morning headaches. Improved daytime sleepiness. He has minimal leak per down load. He does not need any additional supplies.We discussed we will need to do a 90 day down load after he receives his new device. Plan is for the same settings as his OSA  is well controlled and he states the settings are comfortable.    Observations/Objective: No respiratory distress noted while on the phone with the patient. No wheezing , cough or breathlessness.   Down Load 4/21-5/20/2021 Air Sense 10 CPAP Set pressure of 17 cm H2O Worn 30/30 days > 4  Hours 30 days Average nightly use 8 hours 20 minutes AHI=0.6 Minimal leaks noted  TESTS: CPAP 08/14/14 >> CPAP 17 cm H2O >> AHI 3.3, no R.  Assessment and Plan:  OSA Needs a new CPAP machine as his is > than 53 years old. Plan We will place an order for  a new CPAP through your DME. You should get a  call from them within the next several days. Congratulations on your excellent compliane with your CPAP. Continue on CPAP at bedtime. You appear to be benefiting from the treatment  Goal is to wear for at least 6 hours each night for maximal clinical benefit. Continue to work on weight loss, as the link between excess weight  and sleep apnea is well established.   Remember to establish a good bedtime routine, and work on sleep hygiene.  Limit daytime naps , avoid stimulants such as caffeine and nicotine close to bedtime, exercise daily to promote sleep quality, avoid heavy , spicy, fried , or rich foods before bed. Ensure adequate exposure to natural light during the day,establish a relaxing bedtime routine with a pleasant sleep environment ( Bedroom between 60 and 67 degrees, turn off bright lights , TV or device screens screens , consider black out curtains or white noise machines) Do not drive if sleepy. Remember to clean mask, tubing, filter, and reservoir once weekly with soapy water.   HTN Referred to PCP 04/2019 Plan Continued treatment per PCP   Anxiety Referred to Behavioral Health 04/2019 for medication management Plan Per PCP who is prescribing medication  Follow Up Instructions: Follow up with Dr. Halford Chessman of NP in 3  Months with down Load after you get your new device. Please call if you need [place] sooner    I discussed the assessment and treatment plan with the patient. The patient was provided an opportunity to ask questions  and all were answered. The patient agreed with the plan and demonstrated an understanding of the instructions.   The patient was advised to call back or seek an in-person evaluation if the symptoms worsen or if the condition fails to improve as anticipated.  I provided  23 minutes of non-face-to-face time during this encounter.   Bevelyn Ngo, NP 12/04/2019

## 2020-01-02 ENCOUNTER — Other Ambulatory Visit: Payer: Self-pay | Admitting: Family Medicine

## 2020-01-02 DIAGNOSIS — E118 Type 2 diabetes mellitus with unspecified complications: Secondary | ICD-10-CM

## 2020-01-02 DIAGNOSIS — F419 Anxiety disorder, unspecified: Secondary | ICD-10-CM

## 2020-01-08 ENCOUNTER — Telehealth: Payer: Self-pay | Admitting: Family Medicine

## 2020-01-08 NOTE — Telephone Encounter (Signed)
Pt is requesting a refill Alprazolam 0.5 mg. Pt uses [facility] Pt has a follow up appt scheduled on 03/26/20 with Dr. [place]. Thanks

## 2020-01-09 NOTE — Telephone Encounter (Signed)
Rx has been sent in by provider

## 2020-01-18 ENCOUNTER — Other Ambulatory Visit: Payer: Self-pay | Admitting: Family Medicine

## 2020-01-24 ENCOUNTER — Other Ambulatory Visit: Payer: Self-pay | Admitting: Family Medicine

## 2020-02-23 ENCOUNTER — Telehealth: Payer: Self-pay | Admitting: Family Medicine

## 2020-02-23 NOTE — Telephone Encounter (Signed)
Patient informed nurse triage his blood pressure medicine is interfering with his intercourse. he takes Lisinopril-HCTZ for HTN, has taken med for awhile and has had ED ever since. Nurse triage advise patient to see PCP in 3 days.

## 2020-02-23 NOTE — Telephone Encounter (Signed)
Patient called wanting to talk to his nurse about his blood pressure medicine and another medicine that he is taking and it is messing with his you know what.  I advised the patient that Dr. [place] and her medical assistant are off today and I transferred him to nurse triage.  Waiting triage notes

## 2020-02-26 NOTE — Telephone Encounter (Signed)
Pt has an appointment for tomorrow  

## 2020-02-27 ENCOUNTER — Ambulatory Visit: Payer: Medicare Other | Admitting: Family Medicine

## 2020-02-28 ENCOUNTER — Other Ambulatory Visit: Payer: Self-pay

## 2020-02-28 ENCOUNTER — Ambulatory Visit (INDEPENDENT_AMBULATORY_CARE_PROVIDER_SITE_OTHER): Payer: Medicare Other | Admitting: Family Medicine

## 2020-02-28 ENCOUNTER — Encounter: Payer: Self-pay | Admitting: Family Medicine

## 2020-02-28 VITALS — BP 144/90 | HR 107 | Resp 16 | Ht 71.0 in | Wt 374.0 lb

## 2020-02-28 DIAGNOSIS — F419 Anxiety disorder, unspecified: Secondary | ICD-10-CM

## 2020-02-28 DIAGNOSIS — I10 Essential (primary) hypertension: Secondary | ICD-10-CM

## 2020-02-28 DIAGNOSIS — E118 Type 2 diabetes mellitus with unspecified complications: Secondary | ICD-10-CM | POA: Diagnosis not present

## 2020-02-28 DIAGNOSIS — Z23 Encounter for immunization: Secondary | ICD-10-CM

## 2020-02-28 DIAGNOSIS — N529 Male erectile dysfunction, unspecified: Secondary | ICD-10-CM

## 2020-02-28 DIAGNOSIS — F209 Schizophrenia, unspecified: Secondary | ICD-10-CM | POA: Diagnosis not present

## 2020-02-28 LAB — POCT GLYCOSYLATED HEMOGLOBIN (HGB A1C): Hemoglobin A1C: 9.6 % — AB (ref 4.0–5.6)

## 2020-02-28 MED ORDER — RISPERIDONE 2 MG PO TABS: 2.0000 mg | ORAL_TABLET | Freq: Every day | ORAL | 1 refills | Status: DC

## 2020-02-28 MED ORDER — LANTUS SOLOSTAR 100 UNIT/ML ~~LOC~~ SOPN: 25.0000 [IU] | PEN_INJECTOR | Freq: Every day | SUBCUTANEOUS | 1 refills | Status: DC

## 2020-02-28 MED ORDER — SILDENAFIL CITRATE 20 MG PO TABS: 40.0000 mg | ORAL_TABLET | Freq: Every day | ORAL | 1 refills | Status: DC | PRN

## 2020-02-28 MED ORDER — OZEMPIC (0.25 OR 0.5 MG/DOSE) 2 MG/1.5ML ~~LOC~~ SOPN: PEN_INJECTOR | SUBCUTANEOUS | 1 refills | Status: DC

## 2020-02-28 MED ORDER — AMLODIPINE BESYLATE 5 MG PO TABS: 5.0000 mg | ORAL_TABLET | Freq: Every day | ORAL | 1 refills | Status: DC

## 2020-02-28 MED ORDER — ALPRAZOLAM 0.5 MG PO TABS: 0.5000 mg | ORAL_TABLET | Freq: Every day | ORAL | 2 refills | Status: DC | PRN

## 2020-02-28 MED ORDER — ACCU-CHEK AVIVA PLUS W/DEVICE KIT
PACK | 0 refills | Status: DC
Start: 2020-02-28 — End: 2020-03-12

## 2020-02-28 MED ORDER — LISINOPRIL-HYDROCHLOROTHIAZIDE 20-12.5 MG PO TABS: 1.0000 | ORAL_TABLET | Freq: Every day | ORAL | 1 refills | Status: DC

## 2020-02-28 NOTE — Assessment & Plan Note (Signed)
He has lost about 7 pounds since his last visit. Encouraged to continue following a healthful diet and regular physical activity. We discussed benefits of weight loss.

## 2020-02-28 NOTE — Assessment & Plan Note (Signed)
Apparently he has tried SSRIs in the past but did not help. Continue alprazolam 0.5 mg daily as needed.

## 2020-02-28 NOTE — Assessment & Plan Note (Signed)
Problem is not well controlled. We discussed possible CV complications of elevated BP. Amlodipine 5 mg added today. No changes in lisinopril-HCTZ or clonidine. Recommend monitoring BP regularly. Continue low-salt diet. Overdue for eye exam. Follow-up in 3 months, before if needed.

## 2020-02-28 NOTE — Assessment & Plan Note (Signed)
HgA1C improved but still not at goal. Metformin is causing nausea, so discontinued. He agrees with trying Ozempic, started with 0.25 mg weekly x2 and then 0.5 mg weekly. Lantus increased from 20 units to 25 units daily, instructed to increase dose to 30 units if in 2 weeks he is still having 200 glucose. Strongly recommend arranging eye exam. We discussed possible complications of poorly controlled glucose. Annual eye exam and foot care recommended. F/U in 3 months

## 2020-02-28 NOTE — Progress Notes (Signed)
HPI: Mr.Pierre D Degnan is a 54 y.o. male, who is here today for chronic disease management.    He was last seen on 07/25/2019. No new problems since her last visit.  -DM2:Dx'ed 07/25/19.  BS's 200's. No post prandial glucose. Negative for polydipsia,polyuria, or polyphagia.  Lab Results  Component Value Date   HGBA1C 13.4 (H) 07/25/2019  He is taking Metformin 500 mg bid, it causes nausea. He is also on Lantus 20 units daily. Negative for hypoglycemic events. "Little prick" ,occasionally right foot. He has not had eye exam in a while.  -HTN: He is not checking BP at home. BP is elevated today, he attributed to being nervous today. Currently he is on lisinopril-HCTZ 20-12.5 mg daily and clonidine 0.1 mg tablet daily. He denies unusual/frequent headaches, visual changes, CP, dyspnea, palpitations, or edema. Lab Results  Component Value Date   CREATININE 1.12 07/25/2019   BUN 17 07/25/2019   NA 131 (L) 07/25/2019   K 3.9 07/25/2019   CL 95 (L) 07/25/2019   CO2 25 07/25/2019   -Schizophrenia: He has not established with psychiatrist. He is supposed to be on risperidone 2 mg daily, he has not taking medication in a few months. He is taking alprazolam 0.5 mg daily as needed. Auditory hallucinations, denies being violent. He denies visual hallucinations.  Sleeping about 6-7 hours. OSA on CPAP. He was on Lexapro before but did not help. Denies illicit drug use.  -Obesity: He is exercising regularly: Walking and stationary bike. He is trying to control portions.  -He is also requesting medication for ED. Having problems with erections for the past 2 months. He is not having morning erections. Negative for decreased libido or anatomical deformity. He has not been on medication in the past.  Review of Systems  Constitutional: Negative for activity change, appetite change and fever.  HENT: Negative for mouth sores, nosebleeds, sore throat and trouble swallowing.    Respiratory: Negative for cough and wheezing.   Gastrointestinal: Negative for abdominal pain, nausea and vomiting.  Genitourinary: Negative for decreased urine volume, dysuria and hematuria.  Musculoskeletal: Negative for gait problem.  Skin: Negative for rash and wound.  Neurological: Negative for syncope, facial asymmetry and weakness.  Psychiatric/Behavioral: Negative for confusion and dysphoric mood.  Rest of ROS, see pertinent positives sand negatives in HPI  Current Outpatient Medications on File Prior to Visit  Medication Sig Dispense Refill  . Accu-Chek FastClix Lancets MISC USE TO TEST BLOOD SUGAR ONE TO TWO TIMES DAILY 102 each 3  . BD PEN NEEDLE NANO 2ND GEN 32G X 4 MM MISC     . cloNIDine (CATAPRES) 0.1 MG tablet TAKE 1 TABLET(0.1 MG) BY MOUTH TWICE DAILY 180 tablet 1  . diclofenac Sodium (VOLTAREN) 1 % GEL Apply 2 g topically 4 (four) times daily. 2 g 1  . glucose blood (ACCU-CHEK AVIVA PLUS) test strip Use to test blood sugars 1-2 times daily. 100 each 12  . Insulin Pen Needle (PEN NEEDLES) 33G X 4 MM MISC 1 each by Does not apply route daily. To use with insulin pen. 100 each 3  . lovastatin (MEVACOR) 20 MG tablet Take 1 tablet (20 mg total) by mouth at bedtime. 90 tablet 3   No current facility-administered medications on file prior to visit.   Past Medical History:  Diagnosis Date  . Hypertension   . Sleep apnea    Allergies  Allergen Reactions  . Aspirin Nausea Only   Social History   Socioeconomic  History  . Marital status: Single    Spouse name: Not on file  . Number of children: Not on file  . Years of education: Not on file  . Highest education level: Not on file  Occupational History  . Not on file  Tobacco Use  . Smoking status: Never Smoker  . Smokeless tobacco: Never Used  Vaping Use  . Vaping Use: Never used  Substance and Sexual Activity  . Alcohol use: No    Comment: none x 1 month  . Drug use: Yes    Types: Marijuana  . Sexual  activity: Not on file  Other Topics Concern  . Not on file  Social History Narrative  . Not on file   Social Determinants of Health   Financial Resource Strain:   . Difficulty of Paying Living Expenses: Not on file  Food Insecurity:   . Worried About Programme researcher, broadcasting/film/videounning Out of Food in the Last Year: Not on file  . Ran Out of Food in the Last Year: Not on file  Transportation Needs:   . Lack of Transportation (Medical): Not on file  . Lack of Transportation (Non-Medical): Not on file  Physical Activity:   . Days of Exercise per Week: Not on file  . Minutes of Exercise per Session: Not on file  Stress:   . Feeling of Stress : Not on file  Social Connections:   . Frequency of Communication with Friends and Family: Not on file  . Frequency of Social Gatherings with Friends and Family: Not on file  . Attends Religious Services: Not on file  . Active Member of Clubs or Organizations: Not on file  . Attends BankerClub or Organization Meetings: Not on file  . Marital Status: Not on file   Vitals:   02/28/20 1552  BP: (!) 144/90  Pulse: (!) 107  Resp: 16  SpO2: 96%   Wt Readings from Last 3 Encounters:  02/28/20 (!) 374 lb (169.6 kg)  07/25/19 (!) 383 lb (173.7 kg)  01/26/19 (!) 380 lb (172.4 kg)   Body mass index is 52.16 kg/m.  Physical Exam Vitals and nursing note reviewed.  Constitutional:      General: He is not in acute distress.    Appearance: He is well-developed.  HENT:     Head: Normocephalic and atraumatic.     Mouth/Throat:     Pharynx: Oropharynx is clear.  Eyes:     Conjunctiva/sclera: Conjunctivae normal.     Pupils: Pupils are equal, round, and reactive to light.  Cardiovascular:     Rate and Rhythm: Normal rate and regular rhythm.     Pulses:          Dorsalis pedis pulses are 2+ on the right side and 2+ on the left side.     Heart sounds: No murmur heard.   Pulmonary:     Effort: Pulmonary effort is normal. No respiratory distress.     Breath sounds: Normal  breath sounds.  Abdominal:     Palpations: Abdomen is soft. There is no hepatomegaly or mass.     Tenderness: There is no abdominal tenderness.  Lymphadenopathy:     Cervical: No cervical adenopathy.  Skin:    General: Skin is warm.     Findings: No erythema or rash.  Neurological:     Mental Status: He is alert and oriented to person, place, and time.     Cranial Nerves: No cranial nerve deficit.     Gait: Gait normal.  Psychiatric:        Mood and Affect: Affect is flat.     Comments: Well groomed, good eye contact.   ASSESSMENT AND PLAN:  Mr. TYJON BOWEN was seen today for chronic disease management.  Orders Placed This Encounter  Procedures  . Pneumococcal polysaccharide vaccine 23-valent greater than or equal to 2yo subcutaneous/IM  . COMPLETE METABOLIC PANEL WITH GFR  . Microalbumin / creatinine urine ratio  . POC HgB A1c   Lab Results  Component Value Date   HGBA1C 9.6 (A) 02/28/2020   Lab Results  Component Value Date   CREATININE 0.99 02/28/2020   BUN 15 02/28/2020   NA 134 (L) 02/28/2020   K 4.0 02/28/2020   CL 98 02/28/2020   CO2 27 02/28/2020   Lab Results  Component Value Date   ALT 14 02/28/2020   AST 14 02/28/2020   ALKPHOS 80 07/25/2019   BILITOT 0.5 02/28/2020   Morbid obesity (HCC) He has lost about 7 pounds since his last visit. Encouraged to continue following a healthful diet and regular physical activity. We discussed benefits of weight loss.  Type 2 diabetes mellitus with unspecified complications (HCC) HgA1C improved but still not at goal. Metformin is causing nausea, so discontinued. He agrees with trying Ozempic, started with 0.25 mg weekly x2 and then 0.5 mg weekly. Lantus increased from 20 units to 25 units daily, instructed to increase dose to 30 units if in 2 weeks he is still having 200 glucose. Strongly recommend arranging eye exam. We discussed possible complications of poorly controlled glucose. Annual eye exam and foot  care recommended. F/U in 3 months   Hypertension Problem is not well controlled. We discussed possible CV complications of elevated BP. Amlodipine 5 mg added today. No changes in lisinopril-HCTZ or clonidine. Recommend monitoring BP regularly. Continue low-salt diet. Overdue for eye exam. Follow-up in 3 months, before if needed.  Anxiety disorder Apparently he has tried SSRIs in the past but did not help. Continue alprazolam 0.5 mg daily as needed.  Schizophrenia (HCC) He has not been able to establish with psychiatrist, I will continue filling his risperidone until he is able to do so. We discussed some side effects.  Erectile dysfunction We discussed possible etiologies, including poorly controlled hypertension, diabetes, and medications. After discussion of some side effects, he agrees with trying sildenafil 20 mg, he can take 2 to 3 tablets at a time.   Need for pneumococcal vaccination - Pneumococcal polysaccharide vaccine 23-valent greater than or equal to 2yo subcutaneous/IM  Spent 40 minutes with pt, obtaining and documenting hx,performing examination,reviewing prior blood work, providing education about Dx's and prognosis, and discussion of plan.   Return in about 3 months (around 05/30/2020).   Kaisha Wachob G. [place], MD  [facility]. [place] office.   A few things to remember from today's visit:   Anxiety disorder, unspecified type  Schizophrenia, unspecified type (HCC)  Type 2 diabetes mellitus with unspecified complications (HCC) - Plan: insulin glargine (LANTUS SOLOSTAR) 100 UNIT/ML Solostar Pen, Semaglutide,0.25 or 0.5MG /DOS, (OZEMPIC, 0.25 OR 0.5 MG/DOSE,) 2 MG/1.5ML SOPN  Essential hypertension - Plan: amLODipine (NORVASC) 5 MG tablet  Erectile dysfunction, unspecified erectile dysfunction type - Plan: sildenafil (REVATIO) 20 MG tablet  Your diabetes has improved but is still far from goal. Ozempic is a medication you can take weekly.  Start  with 0.25 mg once per week for 2 weeks and then increase it to 0.5 mg weekly. Lantus increased to 25 units.  If  in 2 weeks you are still having 200s blood sugars, you need to increase Lantus from 25 units to 30 units. Because Metformin is causing nausea, stop it. Your blood pressure is not well controlled, so amlodipine 5 mg added today to take at bedtime. No changes in lisinopril-hydrochlorothiazide. You need to arrange your eye exam. I will see you back in 3 months.  If you need refills please call your pharmacy. Do not use My Chart to request refills or for acute issues that need immediate attention.    Please be sure medication list is accurate. If a new problem present, please set up appointment sooner than planned today.

## 2020-02-28 NOTE — Assessment & Plan Note (Signed)
He has not been able to establish with psychiatrist, I will continue filling his risperidone until he is able to do so. We discussed some side effects.

## 2020-02-28 NOTE — Patient Instructions (Signed)
A few things to remember from today's visit:   Anxiety disorder, unspecified type  Schizophrenia, unspecified type (HCC)  Type 2 diabetes mellitus with unspecified complications (HCC) - Plan: insulin glargine (LANTUS SOLOSTAR) 100 UNIT/ML Solostar Pen, Semaglutide,0.25 or 0.5MG /DOS, (OZEMPIC, 0.25 OR 0.5 MG/DOSE,) 2 MG/1.5ML SOPN  Essential hypertension - Plan: amLODipine (NORVASC) 5 MG tablet  Erectile dysfunction, unspecified erectile dysfunction type - Plan: sildenafil (REVATIO) 20 MG tablet  Your diabetes has improved but is still far from goal. Ozempic is a medication you can take weekly.  Start with 0.25 mg once per week for 2 weeks and then increase it to 0.5 mg weekly. Lantus increased to 25 units.  If in 2 weeks you are still having 200s blood sugars, you need to increase Lantus from 25 units to 30 units. Because Metformin is causing nausea, stop it. Your blood pressure is not well controlled, so amlodipine 5 mg added today to take at bedtime. No changes in lisinopril-hydrochlorothiazide. You need to arrange your eye exam. I will see you back in 3 months.  If you need refills please call your pharmacy. Do not use My Chart to request refills or for acute issues that need immediate attention.    Please be sure medication list is accurate. If a new problem present, please set up appointment sooner than planned today.

## 2020-02-28 NOTE — Assessment & Plan Note (Signed)
We discussed possible etiologies, including poorly controlled hypertension, diabetes, and medications. After discussion of some side effects, he agrees with trying sildenafil 20 mg, he can take 2 to 3 tablets at a time.

## 2020-02-29 LAB — COMPLETE METABOLIC PANEL WITH GFR
AG Ratio: 0.8 (calc) — ABNORMAL LOW (ref 1.0–2.5)
ALT: 14 U/L (ref 9–46)
AST: 14 U/L (ref 10–35)
Albumin: 3.6 g/dL (ref 3.6–5.1)
Alkaline phosphatase (APISO): 64 U/L (ref 35–144)
BUN: 15 mg/dL (ref 7–25)
CO2: 27 mmol/L (ref 20–32)
Calcium: 9.5 mg/dL (ref 8.6–10.3)
Chloride: 98 mmol/L (ref 98–110)
Creat: 0.99 mg/dL (ref 0.70–1.33)
GFR, Est African American: 101 mL/min/{1.73_m2} (ref >=60)
GFR, Est Non African American: 87 mL/min/{1.73_m2} (ref >=60)
Globulin: 4.7 g/dL (calc) — ABNORMAL HIGH (ref 1.9–3.7)
Glucose, Bld: 194 mg/dL — ABNORMAL HIGH (ref 65–99)
Potassium: 4 mmol/L (ref 3.5–5.3)
Sodium: 134 mmol/L — ABNORMAL LOW (ref 135–146)
Total Bilirubin: 0.5 mg/dL (ref 0.2–1.2)
Total Protein: 8.3 g/dL — ABNORMAL HIGH (ref 6.1–8.1)

## 2020-03-12 ENCOUNTER — Telehealth: Payer: Self-pay | Admitting: Family Medicine

## 2020-03-12 DIAGNOSIS — N529 Male erectile dysfunction, unspecified: Secondary | ICD-10-CM

## 2020-03-12 MED ORDER — SILDENAFIL CITRATE 20 MG PO TABS: 40.0000 mg | ORAL_TABLET | Freq: Every day | ORAL | 1 refills | Status: DC | PRN

## 2020-03-12 MED ORDER — ACCU-CHEK AVIVA PLUS W/DEVICE KIT
PACK | 0 refills | Status: DC
Start: 2020-03-12 — End: 2020-03-15

## 2020-03-12 NOTE — Telephone Encounter (Signed)
I spoke with patient. I made him aware that the Rx has been sent in to the pharmacy as requested.

## 2020-03-12 NOTE — Telephone Encounter (Signed)
Pt calling in stating that his Rx sildenafil (REVATIO) 20 MG is not at his pharmacy and would like to see if it can be resent.  Pharm:  [facility] in [place], [place]   Pt would like to see if this can be done today.

## 2020-03-14 ENCOUNTER — Telehealth: Payer: Self-pay | Admitting: Family Medicine

## 2020-03-14 NOTE — Telephone Encounter (Signed)
Pt is calling in stating that the blood glucose monitoring supple (ACCU-CHEK AVIVA PLUS) has been discontinued and that the Rx sildenafil (REVATIO) 20 MG need to have a PA.  Pt stated that he is not sure what glucose meter the insurance will pay for b/c he is on Medicare.

## 2020-03-15 MED ORDER — ONETOUCH ULTRA 2 W/DEVICE KIT: PACK | 1 refills | Status: DC

## 2020-03-15 MED ORDER — ONETOUCH ULTRASOFT LANCETS MISC: 12 refills | Status: DC

## 2020-03-15 MED ORDER — ONETOUCH ULTRA VI STRP: ORAL_STRIP | 12 refills | Status: DC

## 2020-03-15 NOTE — Telephone Encounter (Signed)
I called and left pt a voicemail with the information below & advised to call back with any questions.

## 2020-03-15 NOTE — Telephone Encounter (Signed)
Patient called back, spoke with him and went over the info below. He verbalized understanding.

## 2020-03-19 ENCOUNTER — Other Ambulatory Visit: Payer: Self-pay

## 2020-03-19 MED ORDER — TRUEPLUS LANCETS 30G MISC: 12 refills | Status: DC

## 2020-03-19 MED ORDER — TRUE METRIX METER W/DEVICE KIT: PACK | 1 refills | Status: DC

## 2020-03-19 MED ORDER — TRUE METRIX PRO BLOOD GLUCOSE VI STRP: ORAL_STRIP | 12 refills | Status: DC

## 2020-03-21 LAB — HM DIABETES EYE EXAM

## 2020-03-26 ENCOUNTER — Ambulatory Visit: Payer: Medicare Other | Admitting: Family Medicine

## 2020-04-04 ENCOUNTER — Telehealth: Payer: Self-pay | Admitting: Family Medicine

## 2020-04-04 DIAGNOSIS — I10 Essential (primary) hypertension: Secondary | ICD-10-CM

## 2020-04-04 NOTE — Telephone Encounter (Signed)
pt  requseting a refill on medication  lisinopril-hydrochlorothiazide (ZESTORETIC) 20-12.5 MG tablet  lisinopril-hydrochlorothiazide (ZESTORETIC) 20-12.5 MG tablet [facility] [phone] - [place], [place] - [address]  Phone:  [phone] Fax:  [phone]

## 2020-04-05 MED ORDER — LISINOPRIL-HYDROCHLOROTHIAZIDE 20-12.5 MG PO TABS: 1.0000 | ORAL_TABLET | Freq: Every day | ORAL | 1 refills | Status: DC

## 2020-04-05 NOTE — Telephone Encounter (Signed)
Rx sent in

## 2020-04-16 ENCOUNTER — Other Ambulatory Visit: Payer: Self-pay | Admitting: Family Medicine

## 2020-04-16 ENCOUNTER — Telehealth: Payer: Self-pay | Admitting: Family Medicine

## 2020-04-16 DIAGNOSIS — N529 Male erectile dysfunction, unspecified: Secondary | ICD-10-CM

## 2020-04-16 MED ORDER — TRUE METRIX PRO BLOOD GLUCOSE VI STRP: ORAL_STRIP | 12 refills | Status: DC

## 2020-04-16 MED ORDER — BD PEN NEEDLE NANO 2ND GEN 32G X 4 MM MISC: 2 refills | Status: DC

## 2020-04-16 MED ORDER — SILDENAFIL CITRATE 20 MG PO TABS: 40.0000 mg | ORAL_TABLET | Freq: Every day | ORAL | 1 refills | Status: DC | PRN

## 2020-04-16 MED ORDER — TRUE METRIX METER W/DEVICE KIT: PACK | 1 refills | Status: DC

## 2020-04-16 MED ORDER — TRUEPLUS LANCETS 30G MISC: 12 refills | Status: DC

## 2020-04-16 NOTE — Telephone Encounter (Signed)
   pt need refills  BD PEN NEEDLE NANO 2ND GEN 32G X 4 MM MISC Blood Glucose Monitoring Suppl (TRUE METRIX METER) w/Device KIT sildenafil (REVATIO) 20 MG tablet  [facility] [phone] - [place], [place] - [address] AT [address]  Phone:  [phone] Fax:  [phone]

## 2020-04-16 NOTE — Telephone Encounter (Signed)
Rx's sent in. °

## 2020-05-02 ENCOUNTER — Telehealth: Payer: Self-pay

## 2020-05-02 ENCOUNTER — Other Ambulatory Visit: Payer: Self-pay | Admitting: Family Medicine

## 2020-05-02 DIAGNOSIS — E118 Type 2 diabetes mellitus with unspecified complications: Secondary | ICD-10-CM

## 2020-05-02 NOTE — Telephone Encounter (Signed)
Patient called in needing a refill on his medication    Semaglutide,0.25 or 0.5MG /DOS, (OZEMPIC, 0.25 OR 0.5 MG/DOSE,) 2 MG/1.5ML SOPN  [id number] - [place], [place] - [address]

## 2020-05-03 MED ORDER — OZEMPIC (0.25 OR 0.5 MG/DOSE) 2 MG/1.5ML ~~LOC~~ SOPN: PEN_INJECTOR | SUBCUTANEOUS | 1 refills | Status: DC

## 2020-05-03 NOTE — Telephone Encounter (Signed)
Rx sent in

## 2020-07-02 ENCOUNTER — Other Ambulatory Visit: Payer: Self-pay | Admitting: Family Medicine

## 2020-07-02 DIAGNOSIS — E118 Type 2 diabetes mellitus with unspecified complications: Secondary | ICD-10-CM

## 2020-07-18 ENCOUNTER — Telehealth: Payer: Medicare Other | Admitting: Family Medicine

## 2020-07-18 ENCOUNTER — Encounter: Payer: Self-pay | Admitting: Family Medicine

## 2020-07-18 NOTE — Progress Notes (Signed)
See comments  

## 2020-07-23 ENCOUNTER — Ambulatory Visit (INDEPENDENT_AMBULATORY_CARE_PROVIDER_SITE_OTHER): Payer: Medicare Other

## 2020-07-23 ENCOUNTER — Other Ambulatory Visit: Payer: Self-pay

## 2020-07-23 VITALS — Wt 340.0 lb

## 2020-07-23 DIAGNOSIS — E119 Type 2 diabetes mellitus without complications: Secondary | ICD-10-CM

## 2020-07-23 DIAGNOSIS — Z0000 Encounter for general adult medical examination without abnormal findings: Secondary | ICD-10-CM

## 2020-07-23 DIAGNOSIS — Z1211 Encounter for screening for malignant neoplasm of colon: Secondary | ICD-10-CM

## 2020-07-23 NOTE — Patient Instructions (Addendum)
John [name] , Thank you for taking time to come for your Medicare Wellness Visit. I appreciate your ongoing commitment to your health goals. Please review the following plan we discussed and let me know if I can assist you in the future.   Screening recommendations/referrals: Colonoscopy: order placed today 07/23/20 Recommended yearly ophthalmology/optometry visit for glaucoma screening and checkup Recommended yearly dental visit for hygiene and checkup  Vaccinations: Influenza vaccine: declined Pneumococcal vaccine: PSSV Done 02/28/20 Tdap vaccine: Due and discussed Shingles vaccine: Shingrix discussed. Please contact your pharmacy for coverage information.    Covid-19: Completed 3/26 and 10/31/19 Booster scheduled for 07/23/20 @ 7pm  Advanced directives: Advance directive discussed with you today. Even though you declined this today please call our office should you change your mind and we can give you the proper paperwork for you to fill out.  Conditions/risks identified: Continue loosing weight and work with nutritionist  Next appointment: Follow up in one year for your annual wellness visit   Preventive Care 40-64 Years, Male Preventive care refers to lifestyle choices and visits with your health care provider that can promote health and wellness. What does preventive care include?  A yearly physical exam. This is also called an annual well check.  Dental exams once or twice a year.  Routine eye exams. Ask your health care provider how often you should have your eyes checked.  Personal lifestyle choices, including:  Daily care of your teeth and gums.  Regular physical activity.  Eating a healthy diet.  Avoiding tobacco and drug use.  Limiting alcohol use.  Practicing safe sex.  Taking low-dose aspirin every day starting at age 86. What happens during an annual well check? The services and screenings done by your health care provider during your annual well check will  depend on your age, overall health, lifestyle risk factors, and family history of disease. Counseling  Your health care provider may ask you questions about your:  Alcohol use.  Tobacco use.  Drug use.  Emotional well-being.  Home and relationship well-being.  Sexual activity.  Eating habits.  Work and work Astronomer. Screening  You may have the following tests or measurements:  Height, weight, and BMI.  Blood pressure.  Lipid and cholesterol levels. These may be checked every 5 years, or more frequently if you are over 10 years old.  Skin check.  Lung cancer screening. You may have this screening every year starting at age 57 if you have a 30-pack-year history of smoking and currently smoke or have quit within the past 15 years.  Fecal occult blood test (FOBT) of the stool. You may have this test every year starting at age 50.  Flexible sigmoidoscopy or colonoscopy. You may have a sigmoidoscopy every 5 years or a colonoscopy every 10 years starting at age 45.  Prostate cancer screening. Recommendations will vary depending on your family history and other risks.  Hepatitis C blood test.  Hepatitis B blood test.  Sexually transmitted disease (STD) testing.  Diabetes screening. This is done by checking your blood sugar (glucose) after you have not eaten for a while (fasting). You may have this done every 1-3 years. Discuss your test results, treatment options, and if necessary, the need for more tests with your health care provider. Vaccines  Your health care provider may recommend certain vaccines, such as:  Influenza vaccine. This is recommended every year.  Tetanus, diphtheria, and acellular pertussis (Tdap, Td) vaccine. You may need a Td booster every 10 years.  Zoster vaccine. You may need this after age 16.  Pneumococcal 13-valent conjugate (PCV13) vaccine. You may need this if you have certain conditions and have not been vaccinated.  Pneumococcal  polysaccharide (PPSV23) vaccine. You may need one or two doses if you smoke cigarettes or if you have certain conditions. Talk to your health care provider about which screenings and vaccines you need and how often you need them. This information is not intended to replace advice given to you by your health care provider. Make sure you discuss any questions you have with your health care provider. Document Released: 07/26/2015 Document Revised: 03/18/2016 Document Reviewed: 04/30/2015 Elsevier Interactive Patient Education  2017 [employer].  Fall Prevention in the Home Falls can cause injuries. They can happen to people of all ages. There are many things you can do to make your home safe and to help prevent falls. What can I do on the outside of my home?  Regularly fix the edges of walkways and driveways and fix any cracks.  Remove anything that might make you trip as you walk through a door, such as a raised step or threshold.  Trim any bushes or trees on the path to your home.  Use bright outdoor lighting.  Clear any walking paths of anything that might make someone trip, such as rocks or tools.  Regularly check to see if handrails are loose or broken. Make sure that both sides of any steps have handrails.  Any raised decks and porches should have guardrails on the edges.  Have any leaves, snow, or ice cleared regularly.  Use sand or salt on walking paths during winter.  Clean up any spills in your garage right away. This includes oil or grease spills. What can I do in the bathroom?  Use night lights.  Install grab bars by the toilet and in the tub and shower. Do not use towel bars as grab bars.  Use non-skid mats or decals in the tub or shower.  If you need to sit down in the shower, use a plastic, non-slip stool.  Keep the floor dry. Clean up any water that spills on the floor as soon as it happens.  Remove soap buildup in the tub or shower regularly.  Attach bath  mats securely with double-sided non-slip rug tape.  Do not have throw rugs and other things on the floor that can make you trip. What can I do in the bedroom?  Use night lights.  Make sure that you have a light by your bed that is easy to reach.  Do not use any sheets or blankets that are too big for your bed. They should not hang down onto the floor.  Have a firm chair that has side arms. You can use this for support while you get dressed.  Do not have throw rugs and other things on the floor that can make you trip. What can I do in the kitchen?  Clean up any spills right away.  Avoid walking on wet floors.  Keep items that you use a lot in easy-to-reach places.  If you need to reach something above you, use a strong step stool that has a grab bar.  Keep electrical cords out of the way.  Do not use floor polish or wax that makes floors slippery. If you must use wax, use non-skid floor wax.  Do not have throw rugs and other things on the floor that can make you trip. What can I do with  my stairs?  Do not leave any items on the stairs.  Make sure that there are handrails on both sides of the stairs and use them. Fix handrails that are broken or loose. Make sure that handrails are as long as the stairways.  Check any carpeting to make sure that it is firmly attached to the stairs. Fix any carpet that is loose or worn.  Avoid having throw rugs at the top or bottom of the stairs. If you do have throw rugs, attach them to the floor with carpet tape.  Make sure that you have a light switch at the top of the stairs and the bottom of the stairs. If you do not have them, ask someone to add them for you. What else can I do to help prevent falls?  Wear shoes that:  Do not have high heels.  Have rubber bottoms.  Are comfortable and fit you well.  Are closed at the toe. Do not wear sandals.  If you use a stepladder:  Make sure that it is fully opened. Do not climb a closed  stepladder.  Make sure that both sides of the stepladder are locked into place.  Ask someone to hold it for you, if possible.  Clearly mark and make sure that you can see:  Any grab bars or handrails.  First and last steps.  Where the edge of each step is.  Use tools that help you move around (mobility aids) if they are needed. These include:  Canes.  Walkers.  Scooters.  Crutches.  Turn on the lights when you go into a dark area. Replace any light bulbs as soon as they burn out.  Set up your furniture so you have a clear path. Avoid moving your furniture around.  If any of your floors are uneven, fix them.  If there are any pets around you, be aware of where they are.  Review your medicines with your doctor. Some medicines can make you feel dizzy. This can increase your chance of falling. Ask your doctor what other things that you can do to help prevent falls. This information is not intended to replace advice given to you by your health care provider. Make sure you discuss any questions you have with your health care provider. Document Released: 04/25/2009 Document Revised: 12/05/2015 Document Reviewed: 08/03/2014 Elsevier Interactive Patient Education  2017 [employer].

## 2020-07-23 NOTE — Progress Notes (Signed)
Virtual Visit via Telephone Note  I connected with  John [name] on 07/23/20 at 11:00 AM EST by telephone and verified that I am speaking with the correct person using two identifiers.  Location: Patient: Home Provider: Office Persons participating in the virtual visit: patient/Nurse Health Advisor   I discussed the limitations, risks, security and privacy concerns of performing an evaluation and management service by telephone and the availability of in person appointments. The patient expressed understanding and agreed to proceed.  Interactive audio and video telecommunications were attempted between this nurse and patient, however failed, due to patient having technical difficulties OR patient did not have access to video capability.  We continued and completed visit with audio only.  Some vital signs may be absent or patient reported.   John Brace, LPN    Subjective:   John [name] is a 55 y.o. male who presents for an Initial Medicare Annual Wellness Visit.  Review of Systems     Cardiac Risk Factors include: obesity (BMI >30kg/m2);hypertension;dyslipidemia     Objective:    Today's Vitals   07/23/20 1135  Weight: (!) 340 lb (154.2 kg)  PainSc: 7    Body mass index is 47.42 kg/m.  Advanced Directives 07/23/2020 08/14/2014 06/13/2014  Does Patient Have a Medical Advance Directive? No No No  Would patient like information on creating a medical advance directive? No - Patient declined No - patient declined information Yes - Educational materials given    Current Medications (verified) Outpatient Encounter Medications as of 07/23/2020  Medication Sig  . ALPRAZolam (XANAX) 0.5 MG tablet Take 1 tablet (0.5 mg total) by mouth daily as needed for anxiety.  [name] amLODipine (NORVASC) 5 MG tablet Take 1 tablet (5 mg total) by mouth daily.  . BD PEN NEEDLE NANO 2ND GEN 32G X 4 MM MISC Use with insulin pen.  . Blood Glucose Monitoring Suppl (TRUE METRIX METER) w/Device KIT Use  to test blood sugars 1-2 times daily. Dx:e11.9  . cloNIDine (CATAPRES) 0.1 MG tablet TAKE 1 TABLET(0.1 MG) BY MOUTH TWICE DAILY  . glucose blood (TRUE METRIX PRO BLOOD GLUCOSE) test strip Use to test blood sugars 1-2 times daily. Dx: e11.9  . LANTUS SOLOSTAR 100 UNIT/ML Solostar Pen INJECT 25 UNITS UNDER THE SKIN ONCE DAILY  . lisinopril-hydrochlorothiazide (ZESTORETIC) 20-12.5 MG tablet Take 1 tablet by mouth daily.  [name] lovastatin (MEVACOR) 20 MG tablet Take 1 tablet (20 mg total) by mouth at bedtime.  . risperiDONE (RISPERDAL) 2 MG tablet Take 1 tablet (2 mg total) by mouth daily.  . Semaglutide,0.25 or 0.5MG/DOS, (OZEMPIC, 0.25 OR 0.5 MG/DOSE,) 2 MG/1.5ML SOPN Start with 0.25 mg weekly x 2 then 0.5 mg weekly  . sildenafil (REVATIO) 20 MG tablet TAKE 2 TABLETS(40 MG) BY MOUTH DAILY AS NEEDED  . TRUEplus Lancets 30G MISC Use to check blood sugars 1-2 times daily. Dx:e11.9  . [DISCONTINUED] diclofenac Sodium (VOLTAREN) 1 % GEL Apply 2 g topically 4 (four) times daily. (Patient not taking: Reported on 07/23/2020)   No facility-administered encounter medications on file as of 07/23/2020.    Allergies (verified) Aspirin   History: Past Medical History:  Diagnosis Date  . Hypertension   . Sleep apnea    Past Surgical History:  Procedure Laterality Date  . ANKLE SURGERY    . APPENDECTOMY     History reviewed. No pertinent family history. Social History   Socioeconomic History  . Marital status: Single    Spouse name: Not on file  .  Number of children: Not on file  . Years of education: Not on file  . Highest education level: Not on file  Occupational History  . Not on file  Tobacco Use  . Smoking status: Never Smoker  . Smokeless tobacco: Never Used  Vaping Use  . Vaping Use: Never used  Substance and Sexual Activity  . Alcohol use: No    Comment: none x 1 month  . Drug use: Yes    Types: Marijuana  . Sexual activity: Not on file  Other Topics Concern  . Not on file   Social History Narrative  . Not on file   Social Determinants of Health   Financial Resource Strain: Low Risk   . Difficulty of Paying Living Expenses: Not hard at all  Food Insecurity: No Food Insecurity  . Worried About Charity fundraiser in the Last Year: Never true  . Ran Out of Food in the Last Year: Never true  Transportation Needs: No Transportation Needs  . Lack of Transportation (Medical): No  . Lack of Transportation (Non-Medical): No  Physical Activity: Insufficiently Active  . Days of Exercise per Week: 7 days  . Minutes of Exercise per Session: 20 min  Stress: No Stress Concern Present  . Feeling of Stress : Only a little  Social Connections: Socially Isolated  . Frequency of Communication with Friends and Family: Once a week  . Frequency of Social Gatherings with Friends and Family: More than three times a week  . Attends Religious Services: Never  . Active Member of Clubs or Organizations: No  . Attends Archivist Meetings: Never  . Marital Status: Never married    Tobacco Counseling Counseling given: Not Answered   Clinical Intake:  Pre-visit preparation completed: Yes  Pain : 0-10 Pain Score: 7  Pain Type: Acute pain Pain Location: Back Pain Descriptors / Indicators: Sore Pain Onset: Yesterday     BMI - recorded: 47.42 Nutritional Status: BMI > 30  Obese Nutritional Risks: None Diabetes: Yes CBG done?: Yes (135) CBG resulted in Enter/ Edit results?: No Did pt. bring in CBG monitor from home?: No  How often do you need to have someone help you when you read instructions, pamphlets, or other written materials from your doctor or pharmacy?: 1 - Never  Diabetic?Nutrition Risk Assessment:  Has the patient had any N/V/D within the last 2 months?  No  Does the patient have any non-healing wounds?  No  Has the patient had any unintentional weight loss or weight gain?  No   Diabetes:  Is the patient diabetic?  Yes  If diabetic, was a  CBG obtained today?  Yes  Did the patient bring in their glucometer from home?  No  How often do you monitor your CBG's? Daily.   Financial Strains and Diabetes Management:  Are you having any financial strains with the device, your supplies or your medication? No .  Does the patient want to be seen by Chronic Care Management for management of their diabetes?  Yes Would the patient like to be referred to a Nutritionist or for Diabetic Management?  Yes   Diabetic Exams:  Diabetic Eye Exam: Completed 03/21/20 Diabetic Foot Exam: Overdue, Pt has been advised about the importance in completing this exam. Pt is scheduled for diabetic foot exam on next appt with Dr Martinique.   Interpreter Needed?: No  Information entered by :: Charlott Rakes, LPN   Activities of Daily Living In your present state of health,  do you have any difficulty performing the following activities: 07/23/2020 02/28/2020  Hearing? N N  Vision? N N  Difficulty concentrating or making decisions? N N  Walking or climbing stairs? N N  Dressing or bathing? N N  Doing errands, shopping? N N  Preparing Food and eating ? N -  Using the Toilet? N -  In the past six months, have you accidently leaked urine? N -  Do you have problems with loss of bowel control? N -  Managing your Medications? N -  Managing your Finances? N -  Housekeeping or managing your Housekeeping? N -  Some recent data might be hidden    Patient Care Team: Martinique, Betty G, MD as PCP - General (Family Medicine)  Indicate any recent Medical Services you may have received from other than [facility] providers in the past year (date may be approximate).     Assessment:   This is a routine wellness examination for [place].  Hearing/Vision screen  Hearing Screening   '125Hz'  '250Hz'  '500Hz'  '1000Hz'  '2000Hz'  '3000Hz'  '4000Hz'  '6000Hz'  '8000Hz'   Right ear:           Left ear:           Comments: Pt denies any hearing issues   Vision Screening Comments: Pt [facility] up with [facility] in [place]  Dietary issues and exercise activities discussed: Current Exercise Habits: Home exercise routine, Type of exercise: walking, Time (Minutes): 20, Frequency (Times/Week): 7, Weekly Exercise (Minutes/Week): 140  Goals    . Patient Stated     Continue to lose weight       Depression Screen PHQ 2/9 Scores 07/23/2020 07/26/2019  PHQ - 2 Score 0 0    Fall Risk Fall Risk  07/23/2020 10/18/2019 07/26/2019  Falls in the past year? 0 - 1  Comment - - 07/24/19  Number falls in past yr: 0 0 0  Injury with Fall? 0 1 0  Comment - Right knee -  Risk for fall due to : Impaired vision - -  Follow up Falls prevention discussed Education provided -    FALL RISK PREVENTION PERTAINING TO THE HOME:  Any stairs in or around the home? No  If so, are there any without handrails? No  Home free of loose throw rugs in walkways, pet beds, electrical cords, etc? Yes  Adequate lighting in your home to reduce risk of falls? Yes   ASSISTIVE DEVICES UTILIZED TO PREVENT FALLS:  Life alert? No  Use of a cane, walker or w/c? No  Grab bars in the bathroom? No  Shower chair or bench in shower? No  Elevated toilet seat or a handicapped toilet? No   TIMED UP AND GO:  Was the test performed? No .     Cognitive Function:     6CIT Screen 07/23/2020  What Year? 0 points  What month? 0 points  Count back from 20 0 points  Months in reverse 0 points  Repeat phrase 0 points    Immunizations Immunization History  Administered Date(s) Administered  . Influenza,inj,Quad PF,6+ Mos 07/25/2019  . PFIZER SARS-COV-2 Vaccination 10/06/2019, 10/31/2019  . Pneumococcal Polysaccharide-23 02/28/2020    TDAP status: Due, Education has been provided regarding the importance of this vaccine. Advised may receive this vaccine at local pharmacy or Health Dept. Aware to provide a copy of the vaccination record if obtained from local pharmacy or Health Dept. Verbalized acceptance and understanding.  Flu  Vaccine status: Declined, Education has been provided  regarding the importance of this vaccine but patient still declined. Advised may receive this vaccine at local pharmacy or Health Dept. Aware to provide a copy of the vaccination record if obtained from local pharmacy or Health Dept. Verbalized acceptance and understanding.  Pneumococcal vaccine status: Declined,  Education has been provided regarding the importance of this vaccine but patient still declined. Advised may receive this vaccine at local pharmacy or Health Dept. Aware to provide a copy of the vaccination record if obtained from local pharmacy or Health Dept. Verbalized acceptance and understanding.   Covid-19 vaccine status: Completed vaccines  Qualifies for Shingles Vaccine? Yes   Zostavax completed No   Shingrix Completed?: No.    Education has been provided regarding the importance of this vaccine. Patient has been advised to call insurance company to determine out of pocket expense if they have not yet received this vaccine. Advised may also receive vaccine at local pharmacy or Health Dept. Verbalized acceptance and understanding.  Screening Tests Health Maintenance  Topic Date Due  . Hepatitis C Screening  Never done  . FOOT EXAM  Never done  . TETANUS/TDAP  Never done  . COLONOSCOPY (Pts 45-60yr Insurance coverage will need to be confirmed)  Never done  . INFLUENZA VACCINE  02/11/2020  . COVID-19 Vaccine (3 - Booster for Pfizer series) 05/01/2020  . HIV Screening  10/17/2020 (Originally 06/09/1981)  . HEMOGLOBIN A1C  08/30/2020  . OPHTHALMOLOGY EXAM  03/21/2021  . PNEUMOCOCCAL POLYSACCHARIDE VACCINE AGE 75-64 HIGH RISK  Completed    Health Maintenance  Health Maintenance Due  Topic Date Due  . Hepatitis C Screening  Never done  . FOOT EXAM  Never done  . TETANUS/TDAP  Never done  . COLONOSCOPY (Pts 45-464yrInsurance coverage will need to be confirmed)  Never done  . INFLUENZA VACCINE  02/11/2020  . COVID-19  Vaccine (3 - Booster for Pfizer series) 05/01/2020    Colorectal cancer screening: Referral to GI placed 07/23/20. Pt aware the office will call re: appt.  Additional Screening:  Hepatitis C Screening: does qualify;  Vision Screening: Recommended annual ophthalmology exams for early detection of glaucoma and other disorders of the eye. Is the patient up to date with their annual eye exam?  Yes  Who is the provider or what is the name of the office in which the patient attends annual eye exams? [facility] [place]   Dental Screening: Recommended annual dental exams for proper oral hygiene  Community Resource Referral / Chronic Care Management: CRR required this visit?  No   CCM required this visit?  Yes      Plan:     I have personally reviewed and noted the following in the patient's chart:   . Medical and social history . Use of alcohol, tobacco or illicit drugs  . Current medications and supplements . Functional ability and status . Nutritional status . Physical activity . Advanced directives . List of other physicians . Hospitalizations, surgeries, and ER visits in previous 12 months . Vitals . Screenings to include cognitive, depression, and falls . Referrals and appointments  In addition, I have reviewed and discussed with patient certain preventive protocols, quality metrics, and best practice recommendations. A written personalized care plan for preventive services as well as general preventive health recommendations were provided to patient.     TiWillette BraceLPN   [id number] Nurse Notes: None

## 2020-07-27 ENCOUNTER — Other Ambulatory Visit: Payer: Self-pay | Admitting: Family Medicine

## 2020-07-27 DIAGNOSIS — E118 Type 2 diabetes mellitus with unspecified complications: Secondary | ICD-10-CM

## 2020-08-05 DIAGNOSIS — G4733 Obstructive sleep apnea (adult) (pediatric): Secondary | ICD-10-CM | POA: Diagnosis not present

## 2020-08-07 ENCOUNTER — Other Ambulatory Visit: Payer: Self-pay | Admitting: Family Medicine

## 2020-08-07 DIAGNOSIS — F419 Anxiety disorder, unspecified: Secondary | ICD-10-CM

## 2020-08-07 NOTE — Telephone Encounter (Signed)
Last filled 07/02/2020 Last follow up: 02/2020 Due for follow up last month/this month for diabetes & anxiety.

## 2020-08-12 ENCOUNTER — Telehealth (INDEPENDENT_AMBULATORY_CARE_PROVIDER_SITE_OTHER): Payer: Medicare Other | Admitting: Family Medicine

## 2020-08-12 ENCOUNTER — Encounter: Payer: Self-pay | Admitting: Family Medicine

## 2020-08-12 VITALS — Ht 71.0 in

## 2020-08-12 DIAGNOSIS — E118 Type 2 diabetes mellitus with unspecified complications: Secondary | ICD-10-CM | POA: Diagnosis not present

## 2020-08-12 DIAGNOSIS — F209 Schizophrenia, unspecified: Secondary | ICD-10-CM | POA: Diagnosis not present

## 2020-08-12 DIAGNOSIS — F419 Anxiety disorder, unspecified: Secondary | ICD-10-CM

## 2020-08-12 DIAGNOSIS — I10 Essential (primary) hypertension: Secondary | ICD-10-CM

## 2020-08-12 DIAGNOSIS — E785 Hyperlipidemia, unspecified: Secondary | ICD-10-CM | POA: Diagnosis not present

## 2020-08-12 DIAGNOSIS — Z1159 Encounter for screening for other viral diseases: Secondary | ICD-10-CM

## 2020-08-12 DIAGNOSIS — M545 Low back pain, unspecified: Secondary | ICD-10-CM | POA: Diagnosis not present

## 2020-08-12 MED ORDER — ALPRAZOLAM 0.5 MG PO TABS: 0.2500 mg | ORAL_TABLET | Freq: Every day | ORAL | 1 refills | Status: DC | PRN

## 2020-08-12 MED ORDER — RISPERIDONE 2 MG PO TABS: 2.0000 mg | ORAL_TABLET | Freq: Every day | ORAL | 0 refills | Status: DC

## 2020-08-12 NOTE — Progress Notes (Signed)
Virtual Visit via Video Note I connected with John [name] on 08/12/20 by a video enabled telemedicine application and verified that I am speaking with the correct person using two identifiers.  Location patient: home Location provider:work office Persons participating in the virtual visit: patient, provider  I discussed the limitations of evaluation and management by telemedicine and the availability of in person appointments. The patient expressed understanding and agreed to proceed.  Chief Complaint  Patient presents with  . Medication Refill    HPI: John [name] is a 55 yo male with hx of DM II,anxiety,schizophrenia,HTN,and HLD following on some of his chronic medical problems. Last follow up on 02/28/20, virtual visit. He is staying in [place] with her girlfriend after she had a stroke.  DM II:Dx'ed 07/2019. BS's 200's, post prandial. He is not checking fasting BS's. Denies abdominal pain, nausea,vomiting, polydipsia,polyuria, or polyphagia. He is on Lantus 25 U daily and Ozempic 1 mg weekly.  Lab Results  Component Value Date   HGBA1C 9.6 (A) 02/28/2020   HTN: He is on Clonidine 0.1 mg bid,Lisinopril-HCTZ 20-12.5 mg daily,and Amlodipine 5 mg daily. Negative for fevere/frequent headache, visual changes, chest pain, dyspnea, palpitation, focal weakness, or edema.  Lab Results  Component Value Date   CREATININE 0.99 02/28/2020   BUN 15 02/28/2020   NA 134 (L) 02/28/2020   K 4.0 02/28/2020   CL 98 02/28/2020   CO2 27 02/28/2020   HLD: He is on Lovastatin 20 mg daily. Tolerating medication well.  Lab Results  Component Value Date   CHOL 183 07/25/2019   HDL 43.90 07/25/2019   LDLCALC 119 (H) 07/25/2019   TRIG 102.0 07/25/2019   CHOLHDL 4 07/25/2019   Anxiety: He is on Alprazolam 0.5 mg , decreased from bid to qd, thought he was supposed to continue bid,he has not taken medication in a few days.  Problem is exacerbated by "going places."  Schizophrenia: He is on  Risperidone 2 mg bid.  He still has not established with psychiatrist. Hallucinations, hearing "sounds", states that it is "hard" to described. No visual hallucinations. Last week he had suicidal thoughts, denies having a plan or ideation. Denies having any suicidal thought at this time. He calls his son when he start having thoughts.  States that he has a better health insurance and currently trying to establish with mental health provider.  He is also c/o lower back pain, pull a muscle when helping her girlfriend getting up. She recently had a stroke. He has used topical Voltaren but has not helped. Negative for saddle anesthesia or bladder/bowel dysfunction.  ROS: See pertinent positives and negatives per HPI.  Past Medical History:  Diagnosis Date  . Hypertension   . Sleep apnea     Past Surgical History:  Procedure Laterality Date  . ANKLE SURGERY    . APPENDECTOMY     History reviewed. No pertinent family history.  Social History   Socioeconomic History  . Marital status: Single    Spouse name: Not on file  . Number of children: Not on file  . Years of education: Not on file  . Highest education level: Not on file  Occupational History  . Not on file  Tobacco Use  . Smoking status: Never Smoker  . Smokeless tobacco: Never Used  Vaping Use  . Vaping Use: Never used  Substance and Sexual Activity  . Alcohol use: No    Comment: none x 1 month  . Drug use: Yes    Types: Marijuana  .  Sexual activity: Not on file  Other Topics Concern  . Not on file  Social History Narrative  . Not on file   Social Determinants of Health   Financial Resource Strain: Low Risk   . Difficulty of Paying Living Expenses: Not hard at all  Food Insecurity: No Food Insecurity  . Worried About Charity fundraiser in the Last Year: Never true  . Ran Out of Food in the Last Year: Never true  Transportation Needs: No Transportation Needs  . Lack of Transportation (Medical): No  .  Lack of Transportation (Non-Medical): No  Physical Activity: Insufficiently Active  . Days of Exercise per Week: 7 days  . Minutes of Exercise per Session: 20 min  Stress: No Stress Concern Present  . Feeling of Stress : Only a little  Social Connections: Socially Isolated  . Frequency of Communication with Friends and Family: Once a week  . Frequency of Social Gatherings with Friends and Family: More than three times a week  . Attends Religious Services: Never  . Active Member of Clubs or Organizations: No  . Attends Archivist Meetings: Never  . Marital Status: Never married  Intimate Partner Violence: Not At Risk  . Fear of Current or Ex-Partner: No  . Emotionally Abused: No  . Physically Abused: No  . Sexually Abused: No    Current Outpatient Medications:  .  amLODipine (NORVASC) 5 MG tablet, Take 1 tablet (5 mg total) by mouth daily., Disp: 90 tablet, Rfl: 1 .  BD PEN NEEDLE NANO 2ND GEN 32G X 4 MM MISC, Use with insulin pen., Disp: 100 each, Rfl: 2 .  Blood Glucose Monitoring Suppl (TRUE METRIX METER) w/Device KIT, Use to test blood sugars 1-2 times daily. Dx:e11.9, Disp: 1 kit, Rfl: 1 .  cloNIDine (CATAPRES) 0.1 MG tablet, TAKE 1 TABLET(0.1 MG) BY MOUTH TWICE DAILY, Disp: 180 tablet, Rfl: 1 .  glucose blood (TRUE METRIX PRO BLOOD GLUCOSE) test strip, Use to test blood sugars 1-2 times daily. Dx: e11.9, Disp: 100 each, Rfl: 12 .  LANTUS SOLOSTAR 100 UNIT/ML Solostar Pen, INJECT 25 UNITS UNDER THE SKIN ONCE DAILY, Disp: 15 mL, Rfl: 1 .  lisinopril-hydrochlorothiazide (ZESTORETIC) 20-12.5 MG tablet, Take 1 tablet by mouth daily., Disp: 90 tablet, Rfl: 1 .  lovastatin (MEVACOR) 20 MG tablet, Take 1 tablet (20 mg total) by mouth at bedtime., Disp: 90 tablet, Rfl: 3 .  Semaglutide,0.25 or 0.5MG/DOS, (OZEMPIC, 0.25 OR 0.5 MG/DOSE,) 2 MG/1.5ML SOPN, START WITH INJECT 0.25 MG UNDER THE SKIN ONE DAY A WEEK FOR 2 WEEKS THEN INCREASE TO 0.5 MG WEEKLY, Disp: 1.5 mL, Rfl: 1 .   sildenafil (REVATIO) 20 MG tablet, TAKE 2 TABLETS(40 MG) BY MOUTH DAILY AS NEEDED, Disp: 180 tablet, Rfl: 0 .  TRUEplus Lancets 30G MISC, Use to check blood sugars 1-2 times daily. Dx:e11.9, Disp: 100 each, Rfl: 12 .  ALPRAZolam (XANAX) 0.5 MG tablet, Take 0.5-1 tablets (0.25-0.5 mg total) by mouth daily as needed for anxiety. Continue following with psychiatrist., Disp: 30 tablet, Rfl: 1 .  risperiDONE (RISPERDAL) 2 MG tablet, Take 1 tablet (2 mg total) by mouth daily. Continue following with psychiatrist., Disp: 90 tablet, Rfl: 0  EXAM:  VITALS per patient if applicable:Ht <[id number]>_5<\/[id number]>_9  (1.803 m)   BMI 47.42 kg/m   GENERAL: alert, oriented, appears well and in no acute distress  HEENT: atraumatic, conjunctiva clear, no obvious abnormalities on inspection.  NECK: normal movements of the head and neck  LUNGS: on inspection  no signs of respiratory distress, breathing rate appears normal, no obvious gross SOB, gasping or wheezing  CV: no obvious cyanosis  MS: moves all visible extremities without noticeable abnormality  PSYCH/NEURO: pleasant and cooperative, no obvious depression or anxiety, speech and thought processing grossly intact  ASSESSMENT AND PLAN:  Discussed the following assessment and plan:  Type 2 diabetes mellitus with unspecified complications (HCC) - Plan: Basic metabolic panel, Hemoglobin A1c, Fructosamine, Microalbumin / creatinine urine ratio  Hyperlipidemia, unspecified hyperlipidemia type - Plan: Lipid panel Continue Lovastatin 20 mg daily and low fat diet. We will arrange appt for fasting labs.  Schizophrenia, unspecified type ([place]) - Plan: risperiDONE (RISPERDAL) 2 MG tablet Problem does not seem to be well controlled but it is stable. Clearly instructed about warning signs. I gave him a phone number to call and try to establish with psychiatrists asap. Continue same dose of Risperdal.  Essential hypertension We discussed possible complications of elevated  BP. Monitor BP regularly. Low salt diet. Continue same dose of Clonidine,lisinopril,HCTZ,and Amlodipine.  Encounter for HCV screening test for low risk patient - Plan: Hepatitis C antibody  Anxiety disorder, unspecified type - Plan: ALPRAZolam (XANAX) 0.5 MG tablet Problem is not well controlled. I do not feel comfortable managing this with benzodiazepines but will continue low dose until he can establish with psychiatrist.  Bilateral low back pain without sciatica, unspecified chronicity Local heat/cold. OTC Icy hot patch may also help. Recommend avoiding activities that aggravate pain.  We discussed possible serious and likely etiologies, options for evaluation and workup, limitations of telemedicine visit vs in person visit, treatment, treatment risks and precautions. He was advised to call back or seek an in-person evaluation if the symptoms worsen or if the condition fails to improve as anticipated. I discussed the assessment and treatment plan with the patient. The patient was provided an opportunity to ask questions and all were answered. The patient agreed with the plan and demonstrated an understanding of the instructions.  Return in about 4 months (around 12/10/2020) for Needs fasting labs.   Agustine Rossitto Martinique, MD

## 2020-08-12 NOTE — Progress Notes (Signed)
Patient has to find out when his Fiance can bring him to the office. He will call the office back to schedule labs and then he needs a 4 month follow up scheduled.

## 2020-09-04 DIAGNOSIS — G4733 Obstructive sleep apnea (adult) (pediatric): Secondary | ICD-10-CM | POA: Diagnosis not present

## 2020-10-02 DIAGNOSIS — G4733 Obstructive sleep apnea (adult) (pediatric): Secondary | ICD-10-CM | POA: Diagnosis not present

## 2020-10-10 ENCOUNTER — Other Ambulatory Visit: Payer: Self-pay | Admitting: Family Medicine

## 2020-10-10 DIAGNOSIS — E118 Type 2 diabetes mellitus with unspecified complications: Secondary | ICD-10-CM

## 2020-10-11 ENCOUNTER — Encounter: Payer: Medicare Other | Admitting: Gastroenterology

## 2020-11-02 DIAGNOSIS — G4733 Obstructive sleep apnea (adult) (pediatric): Secondary | ICD-10-CM | POA: Diagnosis not present

## 2020-11-03 ENCOUNTER — Other Ambulatory Visit: Payer: Self-pay | Admitting: Family Medicine

## 2020-11-03 DIAGNOSIS — E118 Type 2 diabetes mellitus with unspecified complications: Secondary | ICD-10-CM

## 2020-11-04 DIAGNOSIS — G4733 Obstructive sleep apnea (adult) (pediatric): Secondary | ICD-10-CM | POA: Diagnosis not present

## 2020-11-07 ENCOUNTER — Other Ambulatory Visit: Payer: Self-pay | Admitting: Family Medicine

## 2020-11-07 DIAGNOSIS — F209 Schizophrenia, unspecified: Secondary | ICD-10-CM

## 2020-11-08 ENCOUNTER — Encounter: Payer: Medicare Other | Admitting: Gastroenterology

## 2020-12-03 ENCOUNTER — Telehealth: Payer: Self-pay | Admitting: Pharmacist

## 2020-12-03 DIAGNOSIS — Z79899 Other long term (current) drug therapy: Secondary | ICD-10-CM

## 2020-12-03 DIAGNOSIS — Z760 Encounter for issue of repeat prescription: Secondary | ICD-10-CM

## 2020-12-03 NOTE — Progress Notes (Signed)
[facility])                                            [facility] Quality Pharmacy Team    12/03/2020  John [name] [birth year] [id number]  Per review of chart and payor information, patient has a diagnosis of diabetes but is not currently filling a statin prescription.  This places patient into the SUPD (Statin Use In Patients with Diabetes) measure for CMS.    Patient was prescribed Lovastatin 20 mg but it has not been filled since 04/2020.  Patient was called. He reported that he is still taking Lovastatin and needs a refill called into his Pharmacy.  He also said he needed a refill on Alprazolam.   The 10-year ASCVD risk score Denman George DC Jr., et al., 2013) is: 22.6%   Values used to calculate the score:     Age: 55 years     Sex: Male     Is Non-Hispanic African American: Yes     Diabetic: Yes     Tobacco smoker: No     Systolic Blood Pressure: 144 mmHg     Is BP treated: Yes     HDL Cholesterol: 43.9 mg/dL     Total Cholesterol: 183 mg/dL 4/40/3474     Component Value Date/Time   CHOL 183 07/25/2019 1022   TRIG 102.0 07/25/2019 1022   HDL 43.90 07/25/2019 1022   CHOLHDL 4 07/25/2019 1022   VLDL 20.4 07/25/2019 1022   LDLCALC 119 (H) 07/25/2019 1022    Please consider ONE of the following recommendations:  ? Initiate high intensity statin Atorvastatin 40mg  once daily, #90, 3 refills   Rosuvastatin 20mg  once daily, #90, 3 refills    ? Initiate moderate intensity          statin with reduced frequency if prior          statin intolerance 1x weekly, #13, 3 refills   2x weekly, #26, 3 refills   3x weekly, #39, 3 refills    ? Code for past statin intolerance or  other exclusions (required annually)  Provider Requirements: ? Associate code during an office visit or telehealth encounter  Drug Induced Myopathy G72.0   Myopathy, unspecified G72.9   Myositis, unspecified M60.9   Rhabdomyolysis M62.82   Alcoholic fatty  liver K70.0   Cirrhosis of liver K74.69   Prediabetes R73.03   PCOS E28.2   Toxic liver disease, unspecified K71.9   Adverse effect of antihyperlipidemic and antiarteriosclerotic drugs, initial encounter T46.6X5A    Please let know your decision.    Thank you!    , PharmD, BCACP [facility] Clinical Pharmacist [phone]

## 2020-12-04 ENCOUNTER — Other Ambulatory Visit: Payer: Self-pay | Admitting: Family Medicine

## 2020-12-08 ENCOUNTER — Other Ambulatory Visit: Payer: Self-pay | Admitting: Family Medicine

## 2020-12-08 DIAGNOSIS — E118 Type 2 diabetes mellitus with unspecified complications: Secondary | ICD-10-CM

## 2020-12-11 ENCOUNTER — Other Ambulatory Visit: Payer: Self-pay

## 2020-12-11 MED ORDER — ACCU-CHEK SOFTCLIX LANCETS MISC: 1 refills | Status: DC

## 2020-12-11 MED ORDER — ACCU-CHEK GUIDE W/DEVICE KIT: PACK | 0 refills | Status: DC

## 2020-12-11 MED ORDER — ACCU-CHEK GUIDE VI STRP: ORAL_STRIP | 1 refills | Status: DC

## 2021-01-02 ENCOUNTER — Other Ambulatory Visit: Payer: Self-pay | Admitting: Family Medicine

## 2021-01-05 ENCOUNTER — Other Ambulatory Visit: Payer: Self-pay | Admitting: Family Medicine

## 2021-01-05 DIAGNOSIS — E118 Type 2 diabetes mellitus with unspecified complications: Secondary | ICD-10-CM

## 2021-01-06 ENCOUNTER — Telehealth: Payer: Self-pay | Admitting: Family Medicine

## 2021-01-06 DIAGNOSIS — E118 Type 2 diabetes mellitus with unspecified complications: Secondary | ICD-10-CM

## 2021-01-06 MED ORDER — OZEMPIC (0.25 OR 0.5 MG/DOSE) 2 MG/1.5ML ~~LOC~~ SOPN: PEN_INJECTOR | SUBCUTANEOUS | 0 refills | Status: DC

## 2021-01-06 NOTE — Telephone Encounter (Signed)
Rx sent x 1 month, pt must make appointment for any further refills.

## 2021-01-06 NOTE — Telephone Encounter (Signed)
Patient called stating that he needs Semaglutide,0.25 or 0.5MG /DOS, (OZEMPIC, 0.25 OR 0.5 MG/DOSE,) 2 MG/1.5ML SOPN to be sent to [facility] [phone] - [place], [place] - [address] .  Please advise.

## 2021-02-01 ENCOUNTER — Other Ambulatory Visit: Payer: Self-pay | Admitting: Family Medicine

## 2021-02-01 DIAGNOSIS — E118 Type 2 diabetes mellitus with unspecified complications: Secondary | ICD-10-CM

## 2021-02-01 DIAGNOSIS — F209 Schizophrenia, unspecified: Secondary | ICD-10-CM

## 2021-02-03 ENCOUNTER — Other Ambulatory Visit: Payer: Self-pay

## 2021-02-03 ENCOUNTER — Other Ambulatory Visit: Payer: Self-pay | Admitting: Family Medicine

## 2021-02-03 DIAGNOSIS — G4733 Obstructive sleep apnea (adult) (pediatric): Secondary | ICD-10-CM | POA: Diagnosis not present

## 2021-02-03 DIAGNOSIS — E118 Type 2 diabetes mellitus with unspecified complications: Secondary | ICD-10-CM

## 2021-02-04 ENCOUNTER — Encounter: Payer: Self-pay | Admitting: Family Medicine

## 2021-02-04 ENCOUNTER — Ambulatory Visit (INDEPENDENT_AMBULATORY_CARE_PROVIDER_SITE_OTHER): Payer: Medicare Other | Admitting: Family Medicine

## 2021-02-04 VITALS — BP 170/84 | HR 110 | Resp 16 | Ht 71.0 in | Wt 382.2 lb

## 2021-02-04 DIAGNOSIS — F419 Anxiety disorder, unspecified: Secondary | ICD-10-CM

## 2021-02-04 DIAGNOSIS — I10 Essential (primary) hypertension: Secondary | ICD-10-CM

## 2021-02-04 DIAGNOSIS — F209 Schizophrenia, unspecified: Secondary | ICD-10-CM

## 2021-02-04 DIAGNOSIS — E785 Hyperlipidemia, unspecified: Secondary | ICD-10-CM

## 2021-02-04 DIAGNOSIS — Z1159 Encounter for screening for other viral diseases: Secondary | ICD-10-CM

## 2021-02-04 DIAGNOSIS — E118 Type 2 diabetes mellitus with unspecified complications: Secondary | ICD-10-CM | POA: Diagnosis not present

## 2021-02-04 LAB — LIPID PANEL
Cholesterol: 177 mg/dL (ref 0–200)
HDL: 42.3 mg/dL (ref >39.00)
LDL Cholesterol: 118 mg/dL — ABNORMAL HIGH (ref 0–99)
NonHDL: 134.32
Total CHOL/HDL Ratio: 4
Triglycerides: 82 mg/dL (ref 0.0–149.0)
VLDL: 16.4 mg/dL (ref 0.0–40.0)

## 2021-02-04 LAB — BASIC METABOLIC PANEL
BUN: 13 mg/dL (ref 6–23)
CO2: 27 mEq/L (ref 19–32)
Calcium: 9.4 mg/dL (ref 8.4–10.5)
Chloride: 101 mEq/L (ref 96–112)
Creatinine, Ser: 0.87 mg/dL (ref 0.40–1.50)
GFR: 98.41 mL/min (ref >60.00)
Glucose, Bld: 147 mg/dL — ABNORMAL HIGH (ref 70–99)
Potassium: 4 mEq/L (ref 3.5–5.1)
Sodium: 137 mEq/L (ref 135–145)

## 2021-02-04 LAB — HEPATIC FUNCTION PANEL
ALT: 17 U/L (ref 0–53)
AST: 16 U/L (ref 0–37)
Albumin: 3.6 g/dL (ref 3.5–5.2)
Alkaline Phosphatase: 57 U/L (ref 39–117)
Bilirubin, Direct: 0.1 mg/dL (ref 0.0–0.3)
Total Bilirubin: 0.5 mg/dL (ref 0.2–1.2)
Total Protein: 8.1 g/dL (ref 6.0–8.3)

## 2021-02-04 LAB — POCT GLYCOSYLATED HEMOGLOBIN (HGB A1C): Hemoglobin A1C: 8.5 % — AB (ref 4.0–5.6)

## 2021-02-04 MED ORDER — LISINOPRIL-HYDROCHLOROTHIAZIDE 20-12.5 MG PO TABS: 2.0000 | ORAL_TABLET | Freq: Every day | ORAL | 1 refills | Status: DC

## 2021-02-04 MED ORDER — AMLODIPINE BESYLATE 5 MG PO TABS: 5.0000 mg | ORAL_TABLET | Freq: Every day | ORAL | 1 refills | Status: DC

## 2021-02-04 MED ORDER — LANTUS SOLOSTAR 100 UNIT/ML ~~LOC~~ SOPN: 25.0000 [IU] | PEN_INJECTOR | Freq: Every day | SUBCUTANEOUS | 1 refills | Status: DC

## 2021-02-04 MED ORDER — SEMAGLUTIDE (1 MG/DOSE) 4 MG/3ML ~~LOC~~ SOPN: 1.5000 mg | PEN_INJECTOR | SUBCUTANEOUS | 2 refills | Status: DC

## 2021-02-04 MED ORDER — RISPERIDONE 2 MG PO TABS: ORAL_TABLET | ORAL | 0 refills | Status: DC

## 2021-02-04 MED ORDER — LOVASTATIN 20 MG PO TABS: 20.0000 mg | ORAL_TABLET | Freq: Every day | ORAL | 3 refills | Status: DC

## 2021-02-04 NOTE — Patient Instructions (Addendum)
A few things to remember from today's visit:   Type 2 diabetes mellitus with unspecified complications (HCC) - Plan: Fructosamine, Basic metabolic panel, Microalbumin / creatinine urine ratio, POCT glycosylated hemoglobin (Hb A1C), Amb Referral to Nutrition and Diabetic Education, insulin glargine (LANTUS SOLOSTAR) 100 UNIT/ML Solostar Pen  Hyperlipidemia, unspecified hyperlipidemia type - Plan: Lipid panel, Hepatic function panel  Schizophrenia, unspecified type (HCC) - Plan: risperiDONE (RISPERDAL) 2 MG tablet  Essential hypertension - Plan: lisinopril-hydrochlorothiazide (ZESTORETIC) 20-12.5 MG tablet, Hepatic function panel, amLODipine (NORVASC) 5 MG tablet  Encounter for HCV screening test for low risk patient - Plan: Hep C Antibody  Anxiety disorder, unspecified type  Morbid obesity (HCC), Chronic  If you need refills please call your pharmacy. Do not use My Chart to request refills or for acute issues that need immediate attention.   Xanax increased to 2 times daily as needed. Ozempic increased to 1 mg weekly and in 2 weeks to 1.5 mg. Lisinopril-hydrochlorothiazide increased from 1 tab to 2 tabs daily. Monitor blood pressure at home.  Please be sure medication list is accurate. If a new problem present, please set up appointment sooner than planned today.

## 2021-02-04 NOTE — Progress Notes (Signed)
HPI:  [name] is a 55 y.o. male, who is here today to follow on his diabetes. He was last seen on 08/12/20, virtual visit. No new problems since his last visit.  Diabetes Mellitus II: Dx'ed in 07/2019. - Checking BG at home: He is not checking. - Medications: Ozempic and Lantus - Compliance: Yes - Diet: He has tried to do better. - Exercise: 2-3 times per week. - eye exam:Within the past year. - foot exam: > a year ago. - microalbumin: Overdue. - Negative for symptoms of hypoglycemia, polyuria, polydipsia, numbness extremities, foot ulcers/trauma  Lab Results  Component Value Date   HGBA1C 9.6 (A) 02/28/2020   Hypertension:  Medications:Last time he took medication was yesterday but has not taken it daily, lost part of his Rx. He is not taking Clonidine for about 3-4 months. He is taking Amlodipine some days. BP readings at home:Not checking. Side effects:None. + OSA, he wears his CPAP daily.  Negative for unusual or severe headache, visual changes, exertional chest pain, dyspnea,  focal weakness, or edema.  Lab Results  Component Value Date   CREATININE 0.99 02/28/2020   BUN 15 02/28/2020   NA 134 (L) 02/28/2020   K 4.0 02/28/2020   CL 98 02/28/2020   CO2 27 02/28/2020   Hyperlipidemia: Currently on Lovastatin 20 mg , not taking it daily. Following a low fat diet: He does not eat a lot of fried food. He cooks his meals. He is walking and stationary bike 2-3 times per week.  Side effects from medication:None Lab Results  Component Value Date   CHOL 183 07/25/2019   HDL 43.90 07/25/2019   LDLCALC 119 (H) 07/25/2019   TRIG 102.0 07/25/2019   CHOLHDL 4 07/25/2019   He has not established with psychiatrist. States that he has tried to call and arrange appt but has not received information about appt. Anxiety is exacerbated by crowds. He is on Alprazolam 0.5 mg daily as needed, which helps some but it does not last long. Denies depressed mood. He is on  Risperdal 1 mg bid. Occasional auditory hallucinations but not frequent as far as he takes medication daily.  Review of Systems  Constitutional:  Positive for fatigue. Negative for activity change, appetite change and fever.  HENT:  Positive for trouble swallowing. Negative for nosebleeds and sore throat.   Respiratory:  Negative for cough and wheezing.   Gastrointestinal:  Negative for abdominal pain, nausea and vomiting.  Genitourinary:  Negative for decreased urine volume, dysuria and hematuria.  Musculoskeletal:  Negative for gait problem and myalgias.  Skin:  Negative for rash and wound.  Neurological:  Negative for syncope, facial asymmetry and weakness.  Psychiatric/Behavioral:  Negative for confusion. The patient is nervous/anxious.   Rest see pertinent positives and negatives per HPI.  Current Outpatient Medications on File Prior to Visit  Medication Sig Dispense Refill   Accu-Chek Softclix Lancets lancets Use to test blood sugar 1-2 times daily. Dx:e11.9 200 each 1   BD PEN NEEDLE NANO 2ND GEN 32G X 4 MM MISC Use with insulin pen. 100 each 2   Blood Glucose Monitoring Suppl (ACCU-CHEK GUIDE) w/Device KIT Use to check blood sugars 1-2 times daily. Dx:E11.9 1 kit 0   glucose blood (ACCU-CHEK GUIDE) test strip Use to test blood sugar 1-2 times daily. Dx:e11.9 200 each 1   sildenafil (REVATIO) 20 MG tablet TAKE 2 TABLETS(40 MG) BY MOUTH DAILY AS NEEDED 180 tablet 0   No current facility-administered medications  on file prior to visit.   Past Medical History:  Diagnosis Date   Hypertension    Sleep apnea    Allergies  Allergen Reactions   Aspirin Nausea Only   Social History   Socioeconomic History   Marital status: Single    Spouse name: Not on file   Number of children: Not on file   Years of education: Not on file   Highest education level: Not on file  Occupational History   Not on file  Tobacco Use   Smoking status: Never   Smokeless tobacco: Never  Vaping Use    Vaping Use: Never used  Substance and Sexual Activity   Alcohol use: No    Comment: none x 1 month   Drug use: Yes    Types: Marijuana   Sexual activity: Not on file  Other Topics Concern   Not on file  Social History Narrative   Not on file   Social Determinants of Health   Financial Resource Strain: Low Risk    Difficulty of Paying Living Expenses: Not hard at all  Food Insecurity: No Food Insecurity   Worried About Charity fundraiser in the Last Year: Never true   Ran Out of Food in the Last Year: Never true  Transportation Needs: No Transportation Needs   Lack of Transportation (Medical): No   Lack of Transportation (Non-Medical): No  Physical Activity: Insufficiently Active   Days of Exercise per Week: 7 days   Minutes of Exercise per Session: 20 min  Stress: No Stress Concern Present   Feeling of Stress : Only a little  Social Connections: Socially Isolated   Frequency of Communication with Friends and Family: Once a week   Frequency of Social Gatherings with Friends and Family: More than three times a week   Attends Religious Services: Never   Marine scientist or Organizations: No   Attends Archivist Meetings: Never   Marital Status: Never married   Vitals:   02/04/21 1421  BP: (!) 170/84  Pulse: (!) 110  Resp: 16  SpO2: 94%   Body mass index is 53.31 kg/m.  Physical Exam Vitals and nursing note reviewed.  Constitutional:      General: He is not in acute distress.    Appearance: He is well-developed.  HENT:     Head: Normocephalic and atraumatic.     Mouth/Throat:     Mouth: Mucous membranes are moist.     Pharynx: Oropharynx is clear.  Eyes:     Conjunctiva/sclera: Conjunctivae normal.  Cardiovascular:     Rate and Rhythm: Regular rhythm. Tachycardia present.     Pulses:          Dorsalis pedis pulses are 2+ on the right side and 2+ on the left side.     Heart sounds: No murmur heard. Pulmonary:     Effort: Pulmonary effort  is normal. No respiratory distress.     Breath sounds: Normal breath sounds.  Abdominal:     Palpations: Abdomen is soft. There is no hepatomegaly or mass.     Tenderness: There is no abdominal tenderness.  Lymphadenopathy:     Cervical: No cervical adenopathy.  Skin:    General: Skin is warm.     Findings: No erythema or rash.  Neurological:     Mental Status: He is alert and oriented to person, place, and time.     Cranial Nerves: No cranial nerve deficit.     Gait: Gait  normal.  Psychiatric:     Comments: Well groomed, good eye contact.   Diabetic Foot Exam - Simple   Simple Foot Form Diabetic Foot exam was performed with the following findings: Yes 02/04/2021  3:03 PM  Visual Inspection No deformities, no ulcerations, no other skin breakdown bilaterally: Yes Sensation Testing Intact to touch and monofilament testing bilaterally: Yes Pulse Check Posterior Tibialis and Dorsalis pulse intact bilaterally: Yes Comments    ASSESSMENT AND PLAN:  [name] was seen today for follow-up.  Diagnoses and all orders for this visit: Orders Placed This Encounter  Procedures   Lipid panel   Fructosamine   Basic metabolic panel   Microalbumin / creatinine urine ratio   Hep C Antibody   Hepatic function panel   Amb Referral to Nutrition and Diabetic Education   POCT glycosylated hemoglobin (Hb A1C)   Lab Results  Component Value Date   CHOL 177 02/04/2021   HDL 42.30 02/04/2021   LDLCALC 118 (H) 02/04/2021   TRIG 82.0 02/04/2021   CHOLHDL 4 02/04/2021   Lab Results  Component Value Date   CREATININE 0.87 02/04/2021   BUN 13 02/04/2021   NA 137 02/04/2021   K 4.0 02/04/2021   CL 101 02/04/2021   CO2 27 02/04/2021   Lab Results  Component Value Date   ALT 17 02/04/2021   AST 16 02/04/2021   ALKPHOS 57 02/04/2021   BILITOT 0.5 02/04/2021   Lab Results  Component Value Date   HGBA1C 8.5 (A) 02/04/2021   Type 2 diabetes mellitus with unspecified complications  ([place]) URK2H is not at goal. Ozempic increased from o.5 mg to 1 mg and can increase to 1.5 mg in 2-3 weeks. No changes in Lantus dose. Regular exercise and healthy diet with avoidance of added sugar food intake is an important part of treatment and recommended. Annual eye exam, periodic dental and foot care recommended. F/U in 4 months  -     insulin glargine (LANTUS SOLOSTAR) 100 UNIT/ML Solostar Pen; Inject 25 Units into the skin daily. -     Semaglutide, 1 MG/DOSE, 4 MG/3ML SOPN; Inject 1.5 mg as directed once a week.  Hyperlipidemia, unspecified hyperlipidemia type Continue same dose of Lovastatin and low fat diet.  -     lovastatin (MEVACOR) 20 MG tablet; Take 1 tablet (20 mg total) by mouth at bedtime.  Schizophrenia, unspecified type (HCC) Continue Risperdal 1 mg bid. Continue trying to establish with psychiatrist.  -     risperiDONE (RISPERDAL) 2 MG tablet; TAKE 1 TABLET(2 MG) BY MOUTH DAILY. CONTINUE FOLLOWING WITH PSYCHIATRIST  Essential hypertension BP is not well controlled. Possible complications of elevated BP discussed. Changes today:Lisinopril-HCTZ 20-12.5 mg  increased from 1 tab to 2 tabs daily. No changes in Amlodipine dose.  Low salt diet. Monitor BP at home. Instructed about warning signs. Follow-up in 4 weeks, before if needed.  -     lisinopril-hydrochlorothiazide (ZESTORETIC) 20-12.5 MG tablet; Take 2 tablets by mouth daily. -     amLODipine (NORVASC) 5 MG tablet; Take 1 tablet (5 mg total) by mouth at bedtime.  Encounter for HCV screening test for low risk patient -     Hep C Antibody  Anxiety disorder, unspecified type Problem is not well controlled. He prefers not to change to Clonazepam. He was on Alprazolam 0.5 mg bid before and it helped, so increase dose from qd to bid. Side effects discussed. F/U in 4 weeks.  -     ALPRAZolam (XANAX) 0.5  MG tablet; Take 0.5-1 tablets (0.25-0.5 mg total) by mouth 2 (two) times daily as needed for anxiety.  Continue following with psychiatrist.  Morbid obesity ([place]) We discussed benefits of wt loss as well as adverse effects of obesity. Consistency with healthy diet and physical activity recommended. Ozempic dose increased from 0.5 mg to 1 mg and will titrate to 1.5 mg.  -     Semaglutide, 1 MG/DOSE, 4 MG/3ML SOPN; Inject 1.5 mg as directed once a week.   Return in about 4 weeks (around 03/04/2021) for HTN and anxiety.   Sharifah Champine G. Martinique, MD  [facility]. [place] office.

## 2021-02-06 ENCOUNTER — Encounter: Payer: Self-pay | Admitting: Family Medicine

## 2021-02-06 ENCOUNTER — Telehealth: Payer: Self-pay | Admitting: Family Medicine

## 2021-02-06 MED ORDER — ALPRAZOLAM 0.5 MG PO TABS: 0.2500 mg | ORAL_TABLET | Freq: Two times a day (BID) | ORAL | 1 refills | Status: DC | PRN

## 2021-02-06 NOTE — Telephone Encounter (Signed)
Patient called stating that he needs a prescription for lorazepam to be sent in to [facility] [phone] - [place], [place] - [address]  Please advise.

## 2021-02-07 NOTE — Telephone Encounter (Signed)
Rx was sent in on 7/28.

## 2021-02-12 LAB — FRUCTOSAMINE: Fructosamine: 317 umol/L — ABNORMAL HIGH (ref 205–285)

## 2021-02-12 LAB — HEPATITIS C ANTIBODY
Hepatitis C Ab: NONREACTIVE
SIGNAL TO CUT-OFF: 0.02 (ref <1.00)

## 2021-03-03 ENCOUNTER — Other Ambulatory Visit: Payer: Self-pay | Admitting: Family Medicine

## 2021-03-28 ENCOUNTER — Encounter: Payer: Medicare Other | Attending: Family Medicine | Admitting: Dietician

## 2021-05-02 ENCOUNTER — Telehealth: Payer: Self-pay

## 2021-05-02 NOTE — Telephone Encounter (Signed)
Report shows that pt last refilled medication 02/04/21. Pt states medication was recently refilled.  Last OV on 02/04/21 recommendation was to Return in about 4 weeks (around 03/04/2021) for HTN and anxiety. F/u appt made for 05/05/21. Pt made aware that any refills for anxiety meds can be done at that time. Pt verb understanding.

## 2021-05-05 ENCOUNTER — Ambulatory Visit: Payer: Medicare Other | Admitting: Family Medicine

## 2021-05-05 DIAGNOSIS — G4733 Obstructive sleep apnea (adult) (pediatric): Secondary | ICD-10-CM | POA: Diagnosis not present

## 2021-05-08 ENCOUNTER — Other Ambulatory Visit: Payer: Self-pay | Admitting: Family Medicine

## 2021-05-08 DIAGNOSIS — F209 Schizophrenia, unspecified: Secondary | ICD-10-CM

## 2021-05-25 ENCOUNTER — Other Ambulatory Visit: Payer: Self-pay | Admitting: Family Medicine

## 2021-05-25 DIAGNOSIS — E118 Type 2 diabetes mellitus with unspecified complications: Secondary | ICD-10-CM

## 2021-06-07 ENCOUNTER — Other Ambulatory Visit: Payer: Self-pay | Admitting: Family Medicine

## 2021-06-07 DIAGNOSIS — F209 Schizophrenia, unspecified: Secondary | ICD-10-CM

## 2021-07-23 ENCOUNTER — Other Ambulatory Visit: Payer: Self-pay | Admitting: Family Medicine

## 2021-07-23 DIAGNOSIS — F209 Schizophrenia, unspecified: Secondary | ICD-10-CM

## 2021-07-29 ENCOUNTER — Ambulatory Visit (INDEPENDENT_AMBULATORY_CARE_PROVIDER_SITE_OTHER): Payer: Medicare Other

## 2021-07-29 VITALS — Ht 72.0 in | Wt 350.0 lb

## 2021-07-29 DIAGNOSIS — Z1211 Encounter for screening for malignant neoplasm of colon: Secondary | ICD-10-CM

## 2021-07-29 DIAGNOSIS — Z0000 Encounter for general adult medical examination without abnormal findings: Secondary | ICD-10-CM | POA: Diagnosis not present

## 2021-07-29 NOTE — Progress Notes (Signed)
I connected with  John [name] today via telehealth video enabled device and verified that I am speaking with the correct person using two identifiers.   Location: Patient: home Provider: work  Persons participating in virtual visit: Carlee Tesfaye, Glenna Durand LPN  I discussed the limitations, risks, security and privacy concerns of performing an evaluation and management service by video and the availability of in person appointments. The patient expressed understanding and agreed to proceed.   Some vital signs may be absent or patient reported.     Subjective:   John [name] is a 56 y.o. male who presents for Medicare Annual/Subsequent preventive examination.  Review of Systems     Cardiac Risk Factors include: diabetes mellitus;dyslipidemia;hypertension;male gender;obesity (BMI >30kg/m2)     Objective:    Today's Vitals   07/29/21 1222  Weight: (!) 350 lb (158.8 kg)  Height: 6' (1.829 m)   Body mass index is 47.47 kg/m.  Advanced Directives 07/29/2021 07/23/2020 08/14/2014 06/13/2014  Does Patient Have a Medical Advance Directive? No No No No  Would patient like information on creating a medical advance directive? - No - Patient declined No - patient declined information Yes - Educational materials given    Current Medications (verified) Outpatient Encounter Medications as of 07/29/2021  Medication Sig   Accu-Chek Softclix Lancets lancets Use to test blood sugar 1-2 times daily. Dx:e11.9   amLODipine (NORVASC) 5 MG tablet Take 1 tablet (5 mg total) by mouth at bedtime.   BD PEN NEEDLE NANO 2ND GEN 32G X 4 MM MISC USE WITH INSULIN PEN   Blood Glucose Monitoring Suppl (ACCU-CHEK GUIDE) w/Device KIT Use to check blood sugars 1-2 times daily. Dx:E11.9   glucose blood (ACCU-CHEK GUIDE) test strip Use to test blood sugar 1-2 times daily. Dx:e11.9   LANTUS SOLOSTAR 100 UNIT/ML Solostar Pen ADMINISTER 25 UNITS UNDER THE SKIN DAILY   lisinopril-hydrochlorothiazide (ZESTORETIC)  20-12.5 MG tablet Take 2 tablets by mouth daily.   lovastatin (MEVACOR) 20 MG tablet Take 1 tablet (20 mg total) by mouth at bedtime.   Semaglutide, 1 MG/DOSE, 4 MG/3ML SOPN Inject 1.5 mg as directed once a week.   sildenafil (REVATIO) 20 MG tablet TAKE 2 TABLETS(40 MG) BY MOUTH DAILY AS NEEDED   ALPRAZolam (XANAX) 0.5 MG tablet Take 0.5-1 tablets (0.25-0.5 mg total) by mouth 2 (two) times daily as needed for anxiety. Continue following with psychiatrist. (Patient not taking: Reported on 07/29/2021)   risperiDONE (RISPERDAL) 2 MG tablet TAKE 1 TABLET(2 MG) BY MOUTH DAILY. CONTINUE FOLLOWING WITH PSYCHIATRIST (Patient not taking: Reported on 07/29/2021)   No facility-administered encounter medications on file as of 07/29/2021.    Allergies (verified) Aspirin   History: Past Medical History:  Diagnosis Date   Hypertension    Sleep apnea    Past Surgical History:  Procedure Laterality Date   ANKLE SURGERY     APPENDECTOMY     History reviewed. No pertinent family history. Social History   Socioeconomic History   Marital status: Single    Spouse name: Not on file   Number of children: Not on file   Years of education: Not on file   Highest education level: Not on file  Occupational History   Not on file  Tobacco Use   Smoking status: Never   Smokeless tobacco: Never  Vaping Use   Vaping Use: Never used  Substance and Sexual Activity   Alcohol use: No    Comment: none x 1 month   Drug use:  Not Currently    Types: Marijuana   Sexual activity: Yes  Other Topics Concern   Not on file  Social History Narrative   Not on file   Social Determinants of Health   Financial Resource Strain: Low Risk    Difficulty of Paying Living Expenses: Not hard at all  Food Insecurity: No Food Insecurity   Worried About Charity fundraiser in the Last Year: Never true   [place] in the Last Year: Never true  Transportation Needs: No Transportation Needs   Lack of Transportation  (Medical): No   Lack of Transportation (Non-Medical): No  Physical Activity: Inactive   Days of Exercise per Week: 0 days   Minutes of Exercise per Session: 0 min  Stress: Stress Concern Present   Feeling of Stress : To some extent  Social Connections: Not on file    Tobacco Counseling Counseling given: Not Answered   Clinical Intake:  Pre-visit preparation completed: Yes  Pain : No/denies pain     Nutritional Status: BMI > 30  Obese Nutritional Risks: None Diabetes: Yes  How often do you need to have someone help you when you read instructions, pamphlets, or other written materials from your doctor or pharmacy?: 1 - Never What is the last grade level you completed in school?: 11th grade  Diabetic? Yes Nutrition Risk Assessment:  Has the patient had any N/V/D within the last 2 months?  No  Does the patient have any non-healing wounds?  No  Has the patient had any unintentional weight loss or weight gain?  No   Diabetes:  Is the patient diabetic?  Yes  If diabetic, was a CBG obtained today?  No  Did the patient bring in their glucometer from home?  No  How often do you monitor your CBG's? daily.   Financial Strains and Diabetes Management:  Are you having any financial strains with the device, your supplies or your medication? No .  Does the patient want to be seen by Chronic Care Management for management of their diabetes?  No  Would the patient like to be referred to a Nutritionist or for Diabetic Management?  No   Diabetic Exams:  Diabetic Eye Exam: Overdue for diabetic eye exam. Pt has been advised about the importance in completing this exam. Patient advised to call and schedule an eye exam. Diabetic Foot Exam: Completed 02/04/2021   Interpreter Needed?: No  Information entered by :: NAllen LPN   Activities of Daily Living In your present state of health, do you have any difficulty performing the following activities: 07/29/2021  Hearing? N  Vision? N   Difficulty concentrating or making decisions? N  Walking or climbing stairs? N  Dressing or bathing? N  Doing errands, shopping? N  Preparing Food and eating ? N  Using the Toilet? N  In the past six months, have you accidently leaked urine? N  Do you have problems with loss of bowel control? N  Managing your Medications? N  Managing your Finances? N  Housekeeping or managing your Housekeeping? N  Some recent data might be hidden    Patient Care Team: [place], Betty G, MD as PCP - General (Family Medicine)  Indicate any recent Medical Services you may have received from other than [facility] providers in the past year (date may be approximate).     Assessment:   This is a routine wellness examination for [place].  Hearing/Vision screen Vision Screening - Comments:: Regular eye exams,  Dietary issues and exercise activities discussed: Current Exercise Habits: The patient does not participate in regular exercise at present   Goals Addressed             This Visit's Progress    Patient Stated       07/29/2021, wants to lose 50-60 pounds       Depression Screen PHQ 2/9 Scores 07/29/2021 07/23/2020 07/26/2019  PHQ - 2 Score 1 0 0    Fall Risk Fall Risk  07/29/2021 07/23/2020 10/18/2019 07/26/2019  Falls in the past year? 0 0 - 1  Comment - - - 07/24/19  Number falls in past yr: - 0 0 0  Injury with Fall? - 0 1 0  Comment - - Right knee -  Risk for fall due to : Medication side effect Impaired vision - -  Follow up Falls evaluation completed;Education provided;Falls prevention discussed Falls prevention discussed Education provided -    FALL RISK PREVENTION PERTAINING TO THE HOME:  Any stairs in or around the home? Yes  If so, are there any without handrails? No  Home free of loose throw rugs in walkways, pet beds, electrical cords, etc? Yes  Adequate lighting in your home to reduce risk of falls? Yes   ASSISTIVE DEVICES UTILIZED TO PREVENT FALLS:  Life alert? No  Use of  a cane, walker or w/c? No  Grab bars in the bathroom? No  Shower chair or bench in shower? No  Elevated toilet seat or a handicapped toilet? Yes   TIMED UP AND GO:  Was the test performed? No .      Cognitive Function:     6CIT Screen 07/29/2021 07/23/2020  What Year? 0 points 0 points  What month? 0 points 0 points  What time? 0 points -  Count back from 20 0 points 0 points  Months in reverse 4 points 0 points  Repeat phrase 2 points 0 points  Total Score 6 -    Immunizations Immunization History  Administered Date(s) Administered   Influenza,inj,Quad PF,6+ Mos 07/25/2019   PFIZER(Purple Top)SARS-COV-2 Vaccination 10/06/2019, 10/31/2019   Pneumococcal Polysaccharide-23 02/28/2020    TDAP status: Due, Education has been provided regarding the importance of this vaccine. Advised may receive this vaccine at local pharmacy or Health Dept. Aware to provide a copy of the vaccination record if obtained from local pharmacy or Health Dept. Verbalized acceptance and understanding.  Flu Vaccine status: Due, Education has been provided regarding the importance of this vaccine. Advised may receive this vaccine at local pharmacy or Health Dept. Aware to provide a copy of the vaccination record if obtained from local pharmacy or Health Dept. Verbalized acceptance and understanding.  Pneumococcal vaccine status: Due, Education has been provided regarding the importance of this vaccine. Advised may receive this vaccine at local pharmacy or Health Dept. Aware to provide a copy of the vaccination record if obtained from local pharmacy or Health Dept. Verbalized acceptance and understanding.  Covid-19 vaccine status: Completed vaccines  Qualifies for Shingles Vaccine? Yes   Zostavax completed No   Shingrix Completed?: No.    Education has been provided regarding the importance of this vaccine. Patient has been advised to call insurance company to determine out of pocket expense if they have  not yet received this vaccine. Advised may also receive vaccine at local pharmacy or Health Dept. Verbalized acceptance and understanding.  Screening Tests Health Maintenance  Topic Date Due   HIV Screening  Never done   TETANUS/TDAP  Never done   Zoster Vaccines- Shingrix (1 of 2) Never done   COLONOSCOPY (Pts 45-25yr Insurance coverage will need to be confirmed)  Never done   COVID-19 Vaccine (3 - Pfizer risk series) 11/28/2019   INFLUENZA VACCINE  02/10/2021   Pneumococcal Vaccine 171659Years old (2 - PCV) 02/27/2021   OPHTHALMOLOGY EXAM  03/21/2021   HEMOGLOBIN A1C  08/07/2021   FOOT EXAM  02/04/2022   Hepatitis C Screening  Completed   HPV VACCINES  Aged Out    Health Maintenance  Health Maintenance Due  Topic Date Due   HIV Screening  Never done   TETANUS/TDAP  Never done   Zoster Vaccines- Shingrix (1 of 2) Never done   COLONOSCOPY (Pts 45-482yrInsurance coverage will need to be confirmed)  Never done   COVID-19 Vaccine (3 - Pfizer risk series) 11/28/2019   INFLUENZA VACCINE  02/10/2021   Pneumococcal Vaccine 1919474ears old (2 - PCV) 02/27/2021   OPHTHALMOLOGY EXAM  03/21/2021    Colorectal cancer screening: Referral to GI placed today. Pt aware the office will call re: appt.  Lung Cancer Screening: (Low Dose CT Chest recommended if Age 55-80ears, 30 pack-year currently smoking OR have quit w/in 15years.) does not qualify.   Lung Cancer Screening Referral: no  Additional Screening:  Hepatitis C Screening: does qualify; Completed 02/04/2021  Vision Screening: Recommended annual ophthalmology exams for early detection of glaucoma and other disorders of the eye. Is the patient up to date with their annual eye exam?  No  Who is the provider or what is the name of the office in which the patient attends annual eye exams?  If pt is not established with a provider, would they like to be referred to a provider to establish care? No .   Dental Screening: Recommended  annual dental exams for proper oral hygiene  Community Resource Referral / Chronic Care Management: CRR required this visit?  No   CCM required this visit?  No      Plan:     I have personally reviewed and noted the following in the patients chart:   Medical and social history Use of alcohol, tobacco or illicit drugs  Current medications and supplements including opioid prescriptions. Patient is not currently taking opioid prescriptions. Functional ability and status Nutritional status Physical activity Advanced directives List of other physicians Hospitalizations, surgeries, and ER visits in previous 12 months Vitals Screenings to include cognitive, depression, and falls Referrals and appointments  In addition, I have reviewed and discussed with patient certain preventive protocols, quality metrics, and best practice recommendations. A written personalized care plan for preventive services as well as general preventive health recommendations were provided to patient.     NiKellie SimmeringLPN   07/18/28/1601 Nurse Notes: none

## 2021-07-29 NOTE — Patient Instructions (Signed)
[name] , Thank you for taking time to come for your Medicare Wellness Visit. I appreciate your ongoing commitment to your health goals. Please review the following plan we discussed and let me know if I can assist you in the future.   Screening recommendations/referrals: Colonoscopy: ordered today Recommended yearly ophthalmology/optometry visit for glaucoma screening and checkup Recommended yearly dental visit for hygiene and checkup  Vaccinations: Influenza vaccine: due Pneumococcal vaccine: due Tdap vaccine: due Shingles vaccine: discussed   Covid-19:  10/31/2019, 10/06/2019  Advanced directives: Advance directive discussed with you today. .  Conditions/risks identified: none  Next appointment: Follow up in one year for your annual wellness visit   Preventive Care 40-64 Years, Male Preventive care refers to lifestyle choices and visits with your health care provider that can promote health and wellness. What does preventive care include? A yearly physical exam. This is also called an annual well check. Dental exams once or twice a year. Routine eye exams. Ask your health care provider how often you should have your eyes checked. Personal lifestyle choices, including: Daily care of your teeth and gums. Regular physical activity. Eating a healthy diet. Avoiding tobacco and drug use. Limiting alcohol use. Practicing safe sex. Taking low-dose aspirin every day starting at age 55. What happens during an annual well check? The services and screenings done by your health care provider during your annual well check will depend on your age, overall health, lifestyle risk factors, and family history of disease. Counseling  Your health care provider may ask you questions about your: Alcohol use. Tobacco use. Drug use. Emotional well-being. Home and relationship well-being. Sexual activity. Eating habits. Work and work Astronomer. Screening  You may have the following tests  or measurements: Height, weight, and BMI. Blood pressure. Lipid and cholesterol levels. These may be checked every 5 years, or more frequently if you are over 76 years old. Skin check. Lung cancer screening. You may have this screening every year starting at age 68 if you have a 30-pack-year history of smoking and currently smoke or have quit within the past 15 years. Fecal occult blood test (FOBT) of the stool. You may have this test every year starting at age 83. Flexible sigmoidoscopy or colonoscopy. You may have a sigmoidoscopy every 5 years or a colonoscopy every 10 years starting at age 61. Prostate cancer screening. Recommendations will vary depending on your family history and other risks. Hepatitis C blood test. Hepatitis B blood test. Sexually transmitted disease (STD) testing. Diabetes screening. This is done by checking your blood sugar (glucose) after you have not eaten for a while (fasting). You may have this done every 1-3 years. Discuss your test results, treatment options, and if necessary, the need for more tests with your health care provider. Vaccines  Your health care provider may recommend certain vaccines, such as: Influenza vaccine. This is recommended every year. Tetanus, diphtheria, and acellular pertussis (Tdap, Td) vaccine. You may need a Td booster every 10 years. Zoster vaccine. You may need this after age 62. Pneumococcal 13-valent conjugate (PCV13) vaccine. You may need this if you have certain conditions and have not been vaccinated. Pneumococcal polysaccharide (PPSV23) vaccine. You may need one or two doses if you smoke cigarettes or if you have certain conditions. Talk to your health care provider about which screenings and vaccines you need and how often you need them. This information is not intended to replace advice given to you by your health care provider. Make sure you discuss any  questions you have with your health care provider. Document Released:  07/26/2015 Document Revised: 03/18/2016 Document Reviewed: 04/30/2015 Elsevier Interactive Patient Education  2017 [employer].  Fall Prevention in the Home Falls can cause injuries. They can happen to people of all ages. There are many things you can do to make your home safe and to help prevent falls. What can I do on the outside of my home? Regularly fix the edges of walkways and driveways and fix any cracks. Remove anything that might make you trip as you walk through a door, such as a raised step or threshold. Trim any bushes or trees on the path to your home. Use bright outdoor lighting. Clear any walking paths of anything that might make someone trip, such as rocks or tools. Regularly check to see if handrails are loose or broken. Make sure that both sides of any steps have handrails. Any raised decks and porches should have guardrails on the edges. Have any leaves, snow, or ice cleared regularly. Use sand or salt on walking paths during winter. Clean up any spills in your garage right away. This includes oil or grease spills. What can I do in the bathroom? Use night lights. Install grab bars by the toilet and in the tub and shower. Do not use towel bars as grab bars. Use non-skid mats or decals in the tub or shower. If you need to sit down in the shower, use a plastic, non-slip stool. Keep the floor dry. Clean up any water that spills on the floor as soon as it happens. Remove soap buildup in the tub or shower regularly. Attach bath mats securely with double-sided non-slip rug tape. Do not have throw rugs and other things on the floor that can make you trip. What can I do in the bedroom? Use night lights. Make sure that you have a light by your bed that is easy to reach. Do not use any sheets or blankets that are too big for your bed. They should not hang down onto the floor. Have a firm chair that has side arms. You can use this for support while you get dressed. Do not have  throw rugs and other things on the floor that can make you trip. What can I do in the kitchen? Clean up any spills right away. Avoid walking on wet floors. Keep items that you use a lot in easy-to-reach places. If you need to reach something above you, use a strong step stool that has a grab bar. Keep electrical cords out of the way. Do not use floor polish or wax that makes floors slippery. If you must use wax, use non-skid floor wax. Do not have throw rugs and other things on the floor that can make you trip. What can I do with my stairs? Do not leave any items on the stairs. Make sure that there are handrails on both sides of the stairs and use them. Fix handrails that are broken or loose. Make sure that handrails are as long as the stairways. Check any carpeting to make sure that it is firmly attached to the stairs. Fix any carpet that is loose or worn. Avoid having throw rugs at the top or bottom of the stairs. If you do have throw rugs, attach them to the floor with carpet tape. Make sure that you have a light switch at the top of the stairs and the bottom of the stairs. If you do not have them, ask someone to add them  for you. What else can I do to help prevent falls? Wear shoes that: Do not have high heels. Have rubber bottoms. Are comfortable and fit you well. Are closed at the toe. Do not wear sandals. If you use a stepladder: Make sure that it is fully opened. Do not climb a closed stepladder. Make sure that both sides of the stepladder are locked into place. Ask someone to hold it for you, if possible. Clearly mark and make sure that you can see: Any grab bars or handrails. First and last steps. Where the edge of each step is. Use tools that help you move around (mobility aids) if they are needed. These include: Canes. Walkers. Scooters. Crutches. Turn on the lights when you go into a dark area. Replace any light bulbs as soon as they burn out. Set up your furniture so  you have a clear path. Avoid moving your furniture around. If any of your floors are uneven, fix them. If there are any pets around you, be aware of where they are. Review your medicines with your doctor. Some medicines can make you feel dizzy. This can increase your chance of falling. Ask your doctor what other things that you can do to help prevent falls. This information is not intended to replace advice given to you by your health care provider. Make sure you discuss any questions you have with your health care provider. Document Released: 04/25/2009 Document Revised: 12/05/2015 Document Reviewed: 08/03/2014 Elsevier Interactive Patient Education  2017 [employer].

## 2021-08-01 ENCOUNTER — Telehealth: Payer: Self-pay

## 2021-08-01 NOTE — Telephone Encounter (Signed)
° °  Telephone encounter was:  Successful.  08/01/2021 Name: ARMAS MCBEE MRN: [id number] DOB: 1965/09/22  GARRET TEALE is a 56 y.o. year old male who is a primary care patient of [place], Timoteo Expose, MD . The community resource team was consulted for assistance with  utilities  Care guide performed the following interventions: Spoke with patient verified his email address sent information for [facility] Low Income Energy Assistance Program..  Follow Up Plan:  Care guide will follow up with patient by phone over the next 7-10 days  Khadija Thier Pernell Dupre, AAS Paralegal, [facility] Care Guide  Embedded Care Coordination [facility]   Care Management  [address] [place], [place] [address] ??[email or web address]   ?? [phone]   [email or web address]

## 2021-08-04 DIAGNOSIS — G4733 Obstructive sleep apnea (adult) (pediatric): Secondary | ICD-10-CM | POA: Diagnosis not present

## 2021-08-05 ENCOUNTER — Telehealth: Payer: Self-pay

## 2021-08-05 NOTE — Telephone Encounter (Signed)
° °  Telephone encounter was:  Successful.  08/05/2021 Name: John [name] MRN: [id number] DOB: [birth year]  John [name] is a 57 y.o. year old male who is a primary care patient of [place], Timoteo Expose, MD . The community resource team was consulted for assistance with Financial Difficulties related to utility.  Care guide performed the following interventions: Called patient to verify email address. Email sent 08/01/21 bounced back resent to [email or web address].  Follow Up Plan:  Care guide will follow up with patient by phone over the next 7-10 days  Estela Vinal, AAS Paralegal, [facility] Care Guide  Embedded Care Coordination [facility]   Care Management  [address] [place], [place] [address] ??[email or web address]   ?? [phone]   [email or web address]

## 2021-08-07 ENCOUNTER — Other Ambulatory Visit: Payer: Self-pay | Admitting: Family Medicine

## 2021-08-07 DIAGNOSIS — E118 Type 2 diabetes mellitus with unspecified complications: Secondary | ICD-10-CM

## 2021-08-19 ENCOUNTER — Other Ambulatory Visit: Payer: Self-pay | Admitting: Family Medicine

## 2021-08-19 DIAGNOSIS — I10 Essential (primary) hypertension: Secondary | ICD-10-CM

## 2021-08-22 ENCOUNTER — Telehealth: Payer: Self-pay

## 2021-08-22 NOTE — Telephone Encounter (Signed)
° °  Telephone encounter was:  Successful.  08/22/2021 Name: John [name] MRN: [id number] DOB: [birth year]  John [name] is a 56 y.o. year old male who is a primary care patient of [place], Timoteo Expose, MD . The community resource team was consulted for assistance with Financial Difficulties related to utilities  Care guide performed the following interventions: Spoke with patient he has received the email sent for assistance with utilities and was able to get assistance.   Follow Up Plan:  No further follow up planned at this time. The patient has been provided with needed resources.  John [name], AAS Paralegal, [facility] Care Guide  Embedded Care Coordination [place]   Care Management  [address] [place], [place] [address] ??[email or web address]   ?? [phone]   [email or web address]

## 2021-08-26 ENCOUNTER — Other Ambulatory Visit: Payer: Self-pay | Admitting: Family Medicine

## 2021-08-26 DIAGNOSIS — I10 Essential (primary) hypertension: Secondary | ICD-10-CM

## 2021-08-26 DIAGNOSIS — F419 Anxiety disorder, unspecified: Secondary | ICD-10-CM

## 2021-08-26 DIAGNOSIS — E118 Type 2 diabetes mellitus with unspecified complications: Secondary | ICD-10-CM

## 2021-08-26 MED ORDER — LISINOPRIL-HYDROCHLOROTHIAZIDE 20-12.5 MG PO TABS: 2.0000 | ORAL_TABLET | Freq: Every day | ORAL | 0 refills | Status: DC

## 2021-08-26 MED ORDER — SEMAGLUTIDE (1 MG/DOSE) 4 MG/3ML ~~LOC~~ SOPN: 1.5000 mg | PEN_INJECTOR | SUBCUTANEOUS | 0 refills | Status: DC

## 2021-08-26 MED ORDER — OZEMPIC (0.25 OR 0.5 MG/DOSE) 2 MG/1.5ML ~~LOC~~ SOPN: 1.5000 mg | PEN_INJECTOR | SUBCUTANEOUS | 0 refills | Status: DC

## 2021-08-26 NOTE — Telephone Encounter (Signed)
Pt has made an appt sch for 09-10-2021 and would like a refill on ALPRAZolam (XANAX) 0.5 MG tablet, Semaglutide, 1 MG/DOSE, 4 MG/3ML SOPN, lisinopril-hydrochlorothiazide (ZESTORETIC) 20-12.5 MG tablet send to  [id number] [place], [place] - [address] [place] [address] Phone:  [phone]  Fax:  [phone]

## 2021-08-26 NOTE — Telephone Encounter (Signed)
[facility]ana pharm tech is calling to clarify ozempic if md would like pt to take 1.5 mg they rx needed is .25/5 mg

## 2021-08-28 ENCOUNTER — Encounter: Payer: Self-pay | Admitting: Family Medicine

## 2021-09-09 NOTE — Progress Notes (Deleted)
? ? ? ?HPI: ?[name] is a 56 y.o. male, who is here today for chronic disease management. ? ?Last seen on 02/04/21. ? ?Hyperlipidemia: ?Currently on Lovastatin 20 mg daily. ?Following a low fat diet: ***. ?Side effects from medication:*** ?Lab Results  ?Component Value Date  ? CHOL 177 02/04/2021  ? HDL 42.30 02/04/2021  ? Holt 118 (H) 02/04/2021  ? TRIG 82.0 02/04/2021  ? CHOLHDL 4 02/04/2021  ? ? ?Hypertension:  ?Medications: Amlodipine 5 mg daily and Lisinopril-HCTZ 20-12.5 mg 2 tablets daily. ?BP readings at home:*** ?Side effects:*** ? ?Negative for unusual or severe headache, visual changes, exertional chest pain, dyspnea,  focal weakness, or edema. ? ?Lab Results  ?Component Value Date  ? CREATININE 0.87 02/04/2021  ? BUN 13 02/04/2021  ? NA 137 02/04/2021  ? K 4.0 02/04/2021  ? CL 101 02/04/2021  ? CO2 27 02/04/2021  ? ? ?Diabetes Mellitus II:  ?- Checking BG at home: yes ?- Medications: Ozempic 1.5 mg weekly and Lantus 25 units daily. ?- Compliance: *** ?- Diet: *** ?- Exercise: *** ?- eye exam: *** ?- foot exam: 01/2021 ?- Negative for symptoms of hypoglycemia, polyuria, polydipsia, numbness extremities, foot ulcers/trauma ? ?Lab Results  ?Component Value Date  ? HGBA1C 8.5 (A) 02/04/2021  ? ?No results found for: Derl Barrow ? ? ?Anxiety disorder, unspecified type ?Currently on Alprazolam 0.5 mg, take 1/2-1 tablet by mouth twice daily. ? ?Schizophrenia, unspecified type ([place]) ?Currently on Risperidone 2 mg daily.  ? ? ?Review of Systems ?Rest of ROS see pertinent positives and negatives in HPI. ? ?Current Outpatient Medications on File Prior to Visit  ?Medication Sig Dispense Refill  ? Accu-Chek Softclix Lancets lancets Use to test blood sugar 1-2 times daily. Dx:e11.9 200 each 1  ? ALPRAZolam (XANAX) 0.5 MG tablet Take 0.5-1 tablets (0.25-0.5 mg total) by mouth 2 (two) times daily as needed for anxiety. Continue following with psychiatrist. (Patient not taking: Reported on 07/29/2021)  60 tablet 1  ? amLODipine (NORVASC) 5 MG tablet Take 1 tablet (5 mg total) by mouth at bedtime. 90 tablet 1  ? BD PEN NEEDLE NANO 2ND GEN 32G X 4 MM MISC USE WITH INSULIN PEN 100 each 2  ? Blood Glucose Monitoring Suppl (ACCU-CHEK GUIDE) w/Device KIT Use to check blood sugars 1-2 times daily. Dx:E11.9 1 kit 0  ? glucose blood (ACCU-CHEK GUIDE) test strip Use to test blood sugar 1-2 times daily. Dx:e11.9 200 each 1  ? LANTUS SOLOSTAR 100 UNIT/ML Solostar Pen ADMINISTER 25 UNITS UNDER THE SKIN DAILY 15 mL 0  ? lisinopril-hydrochlorothiazide (ZESTORETIC) 20-12.5 MG tablet Take 2 tablets by mouth daily. 60 tablet 0  ? lovastatin (MEVACOR) 20 MG tablet Take 1 tablet (20 mg total) by mouth at bedtime. 90 tablet 3  ? risperiDONE (RISPERDAL) 2 MG tablet TAKE 1 TABLET(2 MG) BY MOUTH DAILY. CONTINUE FOLLOWING WITH PSYCHIATRIST (Patient not taking: Reported on 07/29/2021) 30 tablet 0  ? Semaglutide,0.25 or 0.5MG/DOS, (OZEMPIC, 0.25 OR 0.5 MG/DOSE,) 2 MG/1.5ML SOPN Inject 1.5 mg into the skin once a week. 6 mL 0  ? sildenafil (REVATIO) 20 MG tablet TAKE 2 TABLETS(40 MG) BY MOUTH DAILY AS NEEDED 180 tablet 0  ? ?No current facility-administered medications on file prior to visit.  ? ? ?Past Medical History:  ?Diagnosis Date  ? Hypertension   ? Sleep apnea   ? ?Allergies  ?Allergen Reactions  ? Aspirin Nausea Only  ? ? ?Social History  ? ?Socioeconomic History  ? Marital  status: Single  ?  Spouse name: Not on file  ? Number of children: Not on file  ? Years of education: Not on file  ? Highest education level: Not on file  ?Occupational History  ? Not on file  ?Tobacco Use  ? Smoking status: Never  ? Smokeless tobacco: Never  ?Vaping Use  ? Vaping Use: Never used  ?Substance and Sexual Activity  ? Alcohol use: No  ?  Comment: none x 1 month  ? Drug use: Not Currently  ?  Types: Marijuana  ? Sexual activity: Yes  ?Other Topics Concern  ? Not on file  ?Social History Narrative  ? Not on file  ? ?Social Determinants of Health   ? ?Financial Resource Strain: Medium Risk  ? Difficulty of Paying Living Expenses: Somewhat hard  ?Food Insecurity: No Food Insecurity  ? Worried About Charity fundraiser in the Last Year: Never true  ? Ran Out of Food in the Last Year: Never true  ?Transportation Needs: No Transportation Needs  ? Lack of Transportation (Medical): No  ? Lack of Transportation (Non-Medical): No  ?Physical Activity: Inactive  ? Days of Exercise per Week: 0 days  ? Minutes of Exercise per Session: 0 min  ?Stress: Stress Concern Present  ? Feeling of Stress : To some extent  ?Social Connections: Not on file  ? ? ?There were no vitals filed for this visit. ?There is no height or weight on file to calculate BMI. ? ?Physical Exam ? ?ASSESSMENT AND PLAN: ? ?There are no diagnoses linked to this encounter. ? ?No orders of the defined types were placed in this encounter. ? ? ?No problem-specific Assessment & Plan notes found for this encounter. ? ? ?No follow-ups on file. ? ?Betty G. Martinique, MD ? ?[place]. ?[place] office. ? ? ? ? ? ? ? ? ? ? ? ? ? ?

## 2021-09-09 NOTE — Patient Instructions (Incomplete)
A few things to remember from today's visit: ? ? ?Type 2 diabetes mellitus with unspecified complications (HCC) ? ?Hyperlipidemia, unspecified hyperlipidemia type ? ?Essential hypertension ? ?Anxiety disorder, unspecified type ? ?Schizophrenia, unspecified type ([place]) ? ?If you need refills please call your pharmacy. ?Do not use My Chart to request refills or for acute issues that need immediate attention. ?  ? ?Please be sure medication list is accurate. ?If a new problem present, please set up appointment sooner than planned today. ? ? ? ? ? ? ? ?

## 2021-09-10 ENCOUNTER — Ambulatory Visit: Payer: Medicare Other | Admitting: Family Medicine

## 2021-09-10 DIAGNOSIS — I10 Essential (primary) hypertension: Secondary | ICD-10-CM

## 2021-09-10 DIAGNOSIS — E785 Hyperlipidemia, unspecified: Secondary | ICD-10-CM

## 2021-09-10 DIAGNOSIS — E118 Type 2 diabetes mellitus with unspecified complications: Secondary | ICD-10-CM

## 2021-09-10 DIAGNOSIS — F419 Anxiety disorder, unspecified: Secondary | ICD-10-CM

## 2021-09-10 DIAGNOSIS — F209 Schizophrenia, unspecified: Secondary | ICD-10-CM

## 2021-09-25 ENCOUNTER — Other Ambulatory Visit: Payer: Self-pay | Admitting: Family Medicine

## 2021-09-27 ENCOUNTER — Other Ambulatory Visit: Payer: Self-pay | Admitting: Family Medicine

## 2021-09-27 DIAGNOSIS — I10 Essential (primary) hypertension: Secondary | ICD-10-CM

## 2021-10-27 NOTE — Progress Notes (Deleted)
HPI: John [name] is a 56 y.o. male, who is here today for chronic disease management.  Last seen on 02/04/2021.  Diabetes Mellitus II:  - Checking BG at home: *** - Medications: *** - Compliance: *** - Diet: *** - Exercise: *** - eye exam: *** - foot exam: *** - Negative for symptoms of hypoglycemia, polyuria, polydipsia, numbness extremities, foot ulcers/trauma  Lab Results  Component Value Date   HGBA1C 8.5 (A) 02/04/2021   No results found for: MICROALBUR, [id number]  Hyperlipidemia: Currently on *** Following a low fat diet: ***. Side effects from medication:*** Lab Results  Component Value Date   CHOL 177 02/04/2021   HDL 42.30 02/04/2021   LDLCALC 118 (H) 02/04/2021   TRIG 82.0 02/04/2021   CHOLHDL 4 02/04/2021    Hypertension:  Medications:*** BP readings at home:*** Side effects:***  Negative for unusual or severe headache, visual changes, exertional chest pain, dyspnea,  focal weakness, or edema.  Lab Results  Component Value Date   CREATININE 0.87 02/04/2021   BUN 13 02/04/2021   NA 137 02/04/2021   K 4.0 02/04/2021   CL 101 02/04/2021   CO2 27 02/04/2021     Review of Systems Rest of ROS see pertinent positives and negatives in HPI.  Current Outpatient Medications on File Prior to Visit  Medication Sig Dispense Refill   Accu-Chek Softclix Lancets lancets Use to test blood sugar 1-2 times daily. Dx:e11.9 200 each 1   ALPRAZolam (XANAX) 0.5 MG tablet Take 0.5-1 tablets (0.25-0.5 mg total) by mouth 2 (two) times daily as needed for anxiety. Continue following with psychiatrist. (Patient not taking: Reported on 07/29/2021) 60 tablet 1   amLODipine (NORVASC) 5 MG tablet Take 1 tablet (5 mg total) by mouth at bedtime. 90 tablet 1   BD PEN NEEDLE NANO 2ND GEN 32G X 4 MM MISC USE WITH INSULIN PEN 100 each 2   Blood Glucose Monitoring Suppl (ACCU-CHEK GUIDE) w/Device KIT Use to check blood sugars 1-2 times daily. Dx:E11.9 1 kit 0   glucose  blood (ACCU-CHEK GUIDE) test strip Use to test blood sugar 1-2 times daily. Dx:e11.9 200 each 1   LANTUS SOLOSTAR 100 UNIT/ML Solostar Pen ADMINISTER 25 UNITS UNDER THE SKIN DAILY 15 mL 0   lisinopril-hydrochlorothiazide (ZESTORETIC) 20-12.5 MG tablet Take 2 tablets by mouth daily. 60 tablet 0   lovastatin (MEVACOR) 20 MG tablet Take 1 tablet (20 mg total) by mouth at bedtime. 90 tablet 3   risperiDONE (RISPERDAL) 2 MG tablet TAKE 1 TABLET(2 MG) BY MOUTH DAILY. CONTINUE FOLLOWING WITH PSYCHIATRIST (Patient not taking: Reported on 07/29/2021) 30 tablet 0   Semaglutide,0.25 or 0.5MG/DOS, (OZEMPIC, 0.25 OR 0.5 MG/DOSE,) 2 MG/1.5ML SOPN Inject 1.5 mg into the skin once a week. 6 mL 0   sildenafil (REVATIO) 20 MG tablet TAKE 2 TABLETS(40 MG) BY MOUTH DAILY AS NEEDED 180 tablet 0   No current facility-administered medications on file prior to visit.    Past Medical History:  Diagnosis Date   Hypertension    Sleep apnea    Allergies  Allergen Reactions   Aspirin Nausea Only    Social History   Socioeconomic History   Marital status: Single    Spouse name: Not on file   Number of children: Not on file   Years of education: Not on file   Highest education level: Not on file  Occupational History   Not on file  Tobacco Use   Smoking status: Never  Smokeless tobacco: Never  Vaping Use   Vaping Use: Never used  Substance and Sexual Activity   Alcohol use: No    Comment: none x 1 month   Drug use: Not Currently    Types: Marijuana   Sexual activity: Yes  Other Topics Concern   Not on file  Social History Narrative   Not on file   Social Determinants of Health   Financial Resource Strain: Medium Risk   Difficulty of Paying Living Expenses: Somewhat hard  Food Insecurity: No Food Insecurity   Worried About Charity fundraiser in the Last Year: Never true   Ran Out of Food in the Last Year: Never true  Transportation Needs: No Transportation Needs   Lack of Transportation  (Medical): No   Lack of Transportation (Non-Medical): No  Physical Activity: Inactive   Days of Exercise per Week: 0 days   Minutes of Exercise per Session: 0 min  Stress: Stress Concern Present   Feeling of Stress : To some extent  Social Connections: Not on file    There were no vitals filed for this visit. There is no height or weight on file to calculate BMI.  Physical Exam  ASSESSMENT AND PLAN:  There are no diagnoses linked to this encounter.  No orders of the defined types were placed in this encounter.   No problem-specific Assessment & Plan notes found for this encounter.   No follow-ups on file.  John G. Martinique, MD  [facility]. [place] office.

## 2021-10-28 ENCOUNTER — Ambulatory Visit: Payer: Medicare Other | Admitting: Family Medicine

## 2021-10-28 DIAGNOSIS — F419 Anxiety disorder, unspecified: Secondary | ICD-10-CM

## 2021-10-28 DIAGNOSIS — E785 Hyperlipidemia, unspecified: Secondary | ICD-10-CM

## 2021-10-28 DIAGNOSIS — I10 Essential (primary) hypertension: Secondary | ICD-10-CM

## 2021-10-28 DIAGNOSIS — F209 Schizophrenia, unspecified: Secondary | ICD-10-CM

## 2021-10-28 DIAGNOSIS — E118 Type 2 diabetes mellitus with unspecified complications: Secondary | ICD-10-CM

## 2021-11-03 DIAGNOSIS — G4733 Obstructive sleep apnea (adult) (pediatric): Secondary | ICD-10-CM | POA: Diagnosis not present

## 2021-11-05 ENCOUNTER — Telehealth: Payer: Self-pay | Admitting: Family Medicine

## 2021-11-05 ENCOUNTER — Other Ambulatory Visit: Payer: Self-pay | Admitting: Family Medicine

## 2021-11-05 NOTE — Telephone Encounter (Signed)
[facility] is calling and 1.5 ml is no longer available they have in stock 3 ml OZEMPIC ?

## 2021-11-05 NOTE — Telephone Encounter (Signed)
Patient has no-showed appointments x 3 times and has not followed on diabetes since 7/22. Rx refills have been denied.  ?

## 2021-11-25 ENCOUNTER — Telehealth: Payer: Self-pay | Admitting: Family Medicine

## 2021-11-26 ENCOUNTER — Encounter (HOSPITAL_COMMUNITY): Payer: Self-pay

## 2021-11-26 ENCOUNTER — Ambulatory Visit (HOSPITAL_COMMUNITY)
Admission: EM | Admit: 2021-11-26 | Discharge: 2021-11-26 | Disposition: A | Discharge status: Home / Self Care | Payer: Medicare Other | Attending: Family Medicine | Admitting: Family Medicine

## 2021-11-26 DIAGNOSIS — M25511 Pain in right shoulder: Secondary | ICD-10-CM | POA: Diagnosis not present

## 2021-11-26 DIAGNOSIS — R000 Tachycardia, unspecified: Secondary | ICD-10-CM | POA: Diagnosis not present

## 2021-11-26 DIAGNOSIS — I10 Essential (primary) hypertension: Secondary | ICD-10-CM | POA: Diagnosis not present

## 2021-11-26 MED ORDER — KETOROLAC TROMETHAMINE 60 MG/2ML IM SOLN
INTRAMUSCULAR | Status: AC
  Filled 2021-11-26: qty 2

## 2021-11-26 MED ORDER — MELOXICAM 15 MG PO TABS: 15.0000 mg | ORAL_TABLET | Freq: Every day | ORAL | 0 refills | Status: DC

## 2021-11-26 MED ORDER — KETOROLAC TROMETHAMINE 60 MG/2ML IM SOLN
60.0000 mg | Freq: Once | INTRAMUSCULAR | Status: AC
  Administered 2021-11-26: 60 mg via INTRAMUSCULAR

## 2021-11-26 NOTE — ED Triage Notes (Signed)
Pt c/o rt shoulder pain after waking up yesterday morning. Denies injury. States took tylenol and joint cream with no relief. ?

## 2021-11-26 NOTE — Discharge Instructions (Signed)
Meds ordered this encounter  ?Medications  ? ketorolac (TORADOL) injection 60 mg  ? meloxicam (MOBIC) 15 MG tablet  ?  Sig: Take 1 tablet (15 mg total) by mouth daily.  ?  Dispense:  14 tablet  ?  Refill:  0  ? ? ?

## 2021-11-27 NOTE — ED Provider Notes (Signed)
[facility]   [id number] 11/26/21 Arrival Time: 1611  ASSESSMENT & PLAN:  1. Acute pain of right shoulder   2. Primary hypertension   3. Tachycardia    ECG reviewed by me: NSR. No STEMI. PVC present.  Overall symptoms consistent with MSK pain. Ques biceps tendonitis. Discussed. Begin: Discharge Medication List as of 11/26/2021  5:59 PM     START taking these medications   Details  meloxicam (MOBIC) 15 MG tablet Take 1 tablet (15 mg total) by mouth daily., Starting Wed 11/26/2021, Normal       Meds ordered this encounter  Medications   ketorolac (TORADOL) injection 60 mg   Orders Placed This Encounter  Procedures   ED EKG   EKG 12-Lead   Activities as tolerated.  Recommend:  Follow-up Information     Schedule an appointment as soon as possible for a visit  with [place], Betty G, MD.   Specialty: Family Medicine Why: To recheck your heart rate after shoulder pain is controlled. Contact information: [address] [place] [address] [phone]                 Reviewed expectations re: course of current medical issues. Questions answered. Outlined signs and symptoms indicating need for more acute intervention. Patient verbalized understanding. After Visit Summary given.  SUBJECTIVE: History from: patient. John [name] is a 56 y.o. male who reports fairly persistent R shoulder pain upon waking yesterday; continuing; fairly persistent. No extremity sensation changes or weakness. Worse with certain movements and lifting arm. No tx PTA. No injury/trauma reported.  Past Surgical History:  Procedure Laterality Date   ANKLE SURGERY     APPENDECTOMY        OBJECTIVE:  Vitals:   11/26/21 1704  BP: (!) 160/99  Pulse: (!) 112  Resp: 20  Temp: 98.7 F (37.1 C)  TempSrc: Oral  SpO2: 95%    Tachycardia noted. Repeat 102 and regular.  General appearance: alert; no distress HEENT: [place]; AT Neck: supple with FROM Resp: unlabored  respirations Extremities: RUE: warm with well perfused appearance; fairly well localized moderate tenderness over right anterior shoulder at biceps insertion; without gross deformities; swelling: none; bruising: none; shoulder ROM: normal, with discomfort CV: brisk extremity capillary refill of RUE; 2+ radial pulse of RUE. Skin: warm and dry; no visible rashes Neurologic: gait normal; normal sensation and strength of RUE Psychological: alert and cooperative; normal mood and affect     Allergies  Allergen Reactions   Aspirin Nausea Only    Past Medical History:  Diagnosis Date   Hypertension    Sleep apnea    Social History   Socioeconomic History   Marital status: Single    Spouse name: Not on file   Number of children: Not on file   Years of education: Not on file   Highest education level: Not on file  Occupational History   Not on file  Tobacco Use   Smoking status: Never   Smokeless tobacco: Never  Vaping Use   Vaping Use: Never used  Substance and Sexual Activity   Alcohol use: No    Comment: none x 1 month   Drug use: Not Currently    Types: Marijuana   Sexual activity: Yes  Other Topics Concern   Not on file  Social History Narrative   Not on file   Social Determinants of Health   Financial Resource Strain: Medium Risk   Difficulty of Paying Living Expenses: Somewhat hard  Food Insecurity: No Food Insecurity   Worried About Programme researcher, broadcasting/film/video in the Last Year: Never true   Ran Out of Food in the Last Year: Never true  Transportation Needs: No Transportation Needs   Lack of Transportation (Medical): No   Lack of Transportation (Non-Medical): No  Physical Activity: Inactive   Days of Exercise per Week: 0 days   Minutes of Exercise per Session: 0 min  Stress: Stress Concern Present   Feeling of Stress : To some extent  Social Connections: Not on file   History reviewed. No pertinent family history. Past Surgical History:  Procedure Laterality  Date   ANKLE SURGERY     APPENDECTOMY         Mardella Layman, MD 11/27/21 1005

## 2021-12-01 ENCOUNTER — Encounter: Payer: Medicare Other | Admitting: Internal Medicine

## 2021-12-03 ENCOUNTER — Ambulatory Visit (INDEPENDENT_AMBULATORY_CARE_PROVIDER_SITE_OTHER): Payer: Medicare Other | Admitting: Internal Medicine

## 2021-12-03 ENCOUNTER — Other Ambulatory Visit: Payer: Self-pay

## 2021-12-03 ENCOUNTER — Encounter: Payer: Self-pay | Admitting: Internal Medicine

## 2021-12-03 VITALS — BP 170/95 | HR 118 | Temp 98.2°F | Ht 71.0 in | Wt 369.3 lb

## 2021-12-03 DIAGNOSIS — Z7985 Long-term (current) use of injectable non-insulin antidiabetic drugs: Secondary | ICD-10-CM | POA: Diagnosis not present

## 2021-12-03 DIAGNOSIS — E1169 Type 2 diabetes mellitus with other specified complication: Secondary | ICD-10-CM

## 2021-12-03 DIAGNOSIS — F419 Anxiety disorder, unspecified: Secondary | ICD-10-CM | POA: Diagnosis not present

## 2021-12-03 DIAGNOSIS — Z0000 Encounter for general adult medical examination without abnormal findings: Secondary | ICD-10-CM

## 2021-12-03 DIAGNOSIS — E785 Hyperlipidemia, unspecified: Secondary | ICD-10-CM

## 2021-12-03 DIAGNOSIS — I10 Essential (primary) hypertension: Secondary | ICD-10-CM

## 2021-12-03 DIAGNOSIS — E118 Type 2 diabetes mellitus with unspecified complications: Secondary | ICD-10-CM

## 2021-12-03 DIAGNOSIS — F209 Schizophrenia, unspecified: Secondary | ICD-10-CM | POA: Diagnosis not present

## 2021-12-03 DIAGNOSIS — G4733 Obstructive sleep apnea (adult) (pediatric): Secondary | ICD-10-CM | POA: Diagnosis not present

## 2021-12-03 LAB — POCT GLYCOSYLATED HEMOGLOBIN (HGB A1C): Hemoglobin A1C: 10.3 % — AB (ref 4.0–5.6)

## 2021-12-03 LAB — GLUCOSE, CAPILLARY: Glucose-Capillary: 326 mg/dL — ABNORMAL HIGH (ref 70–99)

## 2021-12-03 MED ORDER — OZEMPIC (0.25 OR 0.5 MG/DOSE) 2 MG/1.5ML ~~LOC~~ SOPN: 0.5000 mg | PEN_INJECTOR | SUBCUTANEOUS | 0 refills | Status: DC

## 2021-12-03 MED ORDER — LANTUS SOLOSTAR 100 UNIT/ML ~~LOC~~ SOPN: 25.0000 [IU] | PEN_INJECTOR | Freq: Every day | SUBCUTANEOUS | 1 refills | Status: DC

## 2021-12-03 MED ORDER — AMLODIPINE BESYLATE 5 MG PO TABS: 5.0000 mg | ORAL_TABLET | Freq: Every day | ORAL | 2 refills | Status: DC

## 2021-12-03 MED ORDER — LOVASTATIN 20 MG PO TABS: 20.0000 mg | ORAL_TABLET | Freq: Every day | ORAL | 3 refills | Status: DC

## 2021-12-03 MED ORDER — LISINOPRIL-HYDROCHLOROTHIAZIDE 20-12.5 MG PO TABS: 2.0000 | ORAL_TABLET | Freq: Every day | ORAL | 2 refills | Status: DC

## 2021-12-03 NOTE — Patient Instructions (Signed)
Thank you, [name] for allowing [place] to provide your care today. Today we discussed:  Diabetes: -Continue Lantus 25u -Restart Ozempic at dose 0.5mg  weekly, come back in one month and we will increase Ozempic -Checking Hgb A1c today   High Blood Pressure: -Restart amlodipine 5mg  daily -Restart Lisinopril-HCTZ 20-12.5mg  daily -Check your blood pressures at home once you've restarted your medicines.  I put in a referral for you to see a psychiatrist, their office will call you to make an appointment.  I put in a referral to see an eye doctor here in [place], they will call you to set up an appointment.  I have ordered the following labs for you:   Lab Orders         BMP8+Anion Gap         POC Hbg A1C       My Chart Access: [email or web address] ?  Please follow-up in 1 month to increase Ozempic and check your blood pressure.  Please make sure to arrive 15 minutes prior to your next appointment. If you arrive late, you may be asked to reschedule.    We look forward to seeing you next time. Please call our clinic at [phone] if you have any questions or concerns. The best time to call is Monday-Friday from 9am-4pm, but there is someone available 24/7. If after hours or the weekend, call the main hospital number and ask for the Internal Medicine Resident On-Call. If you need medication refills, please notify your pharmacy one week in advance and they will send [phone] a request.   Thank you for letting us take part in your care. Wishing you the best!  [place], MD 12/03/2021, 2:40 PM IM Resident, PGY-1

## 2021-12-03 NOTE — Assessment & Plan Note (Signed)
BMI 51 today.  Patient weighs 369 pounds.  Prior to starting Ozempic patient weighed 380 pounds and after starting Ozempic he was able to get down to 350 pounds.  Unfortunately he has been out of his Ozempic for about a month and he has gained the weight back.  He reports exercising every other day by walking up and down stairs of his apartment complex.  His girlfriend has also helped him restrict fried foods and stick to baked foods. Plan: -Continue lifestyle modifications- -We will restart Ozempic -May benefit from referral to [facility] for nutrition counseling or referral to exercise program

## 2021-12-03 NOTE — Assessment & Plan Note (Signed)
Patient reports symptoms anxiety including getting anxious when he leaves the home and walks into stores.  He has been off his Xanax for several months, last filled September 2022.  Per chart review, patient has tried SSRIs in the past, unclear which ones he tried or for how long he trialed them.    Patient has symptoms of mild to moderate anxiety which he has been managing without medication for the last several months.  Given diagnosis of schizophrenia, will defer further management of psychiatric conditions to psychiatry Plan: -Ambulatory referral to psychiatry

## 2021-12-03 NOTE — Assessment & Plan Note (Signed)
Patient declines HIV testing.  Please discuss colon cancer screening and vaccinations at next appointment.

## 2021-12-03 NOTE — Assessment & Plan Note (Signed)
Patient is to follow-up with Dr. Omelia Blackwater, psychiatrist, though his office closed a few years back.  Referral was placed in 2020 to psychiatry though was denied.  Today patient endorses visual hallucinations, seeing black figures, as well as auditory hallucinations.  These do not currently cause him distress and are not significantly disruptive to life.  He endorses his son as a source of support to help him deal with the symptoms and his anxiety. On assessment, patient has history of schizophrenia with occasional hallucinations, no flat affect, thoughts linear, not a danger to self or others at this time.  Patient used to take Risperdal but has been off this for several months and is managing all right off treatment.  We will hold off on restarting Risperdal for now. Plan: -Ambulatory referral to psychiatry

## 2021-12-03 NOTE — Assessment & Plan Note (Addendum)
Patient presents to establish care with [facility] as PCP.  Previously he was following with family medicine though no showed 3 appointments while patient was temporarily staying in [place] and was dismissed from clinic.  He has been off his antihypertensive medication for about 1 month.  Blood pressure today 170/95.  He reports at home when he is taking his blood pressure medicines daily his systolic blood pressure is usually in the 130s.  Previously on amlodipine 5 mg daily and lisinopril-hydrochlorothiazide 20-12.5 mg daily. Last BMP July 2022 unremarkable.  On assessment patient's blood pressure is poorly controlled in the setting of patient unable to obtain medication refills. Plan: -Refilled amlodipine 5 mg daily -Refilled lisinopril-hydrochlorothiazide 20-12.5 mg 2 tablets daily -Encourage patient to continue to monitor blood pressures at home, check once a day at the same time every day. -Patient to follow-up in 1 month for BP check -Check BMP today

## 2021-12-03 NOTE — Progress Notes (Signed)
CC: establish care  HPI:  [name] is a 56 y.o. male with a past medical history stated below. He no-showed 3 appointments with his prior PCP due to temporary stay in [facility] and has been unable to obtain medication refills. He has moved back to [facility] and is establishing care with [facility] as his PCP.  Hypertension Patient presents to establish care with [facility] as PCP.  Previously he was following with family medicine though no showed 3 appointments while patient was temporarily staying in [place] and was dismissed from clinic.  He has been off his antihypertensive medication for about 1 month.  Blood pressure today 170/95.  He reports at home when he is taking his blood pressure medicines daily his systolic blood pressure is usually in the 130s.  Previously on amlodipine 5 mg daily and lisinopril-hydrochlorothiazide 20-12.5 mg daily. Last BMP July 2022 unremarkable.  On assessment patient's blood pressure is poorly controlled in the setting of patient unable to obtain medication refills. Plan: -Refilled amlodipine 5 mg daily -Refilled lisinopril-hydrochlorothiazide 20-12.5 mg 2 tablets daily -Encourage patient to continue to monitor blood pressures at home, check once a day at the same time every day. -Patient to follow-up in 1 month for BP check -Check BMP today  OSA (obstructive sleep apnea) Patient reports adherence to CPAP nightly.  No problems with using his machine.  His sleep has significantly improved since starting CPAP use several years ago.  Type 2 diabetes mellitus with unspecified complications (HCC) Patient has history of type 2 diabetes mellitus, was previously prescribed Lantus 25 units daily and on Ozempic 1.5 mg weekly though has been unable to obtain refills since he was dismissed from prior PCP.  He has been out of his medications for about 1 month.  Last hemoglobin A1c July 2022 was 8.5%.  Hemoglobin A1c today 10.3%.  Patient reported was losing weight on Ozempic and  overall was feeling better while taking Ozempic.  He has tried metformin in the past though was unable to tolerate this due to GI side effects.  He is making an active attempt to exercise every other day by walking up and down stairs in his apartment complex as well as to eliminate fried foods from his diet and eat only baked foods.  He is to follow-up with an eye doctor in [place], does not have an eye doctor here in [place].  On assessment, type 2 diabetes mellitus poorly controlled in the setting of patient unable to obtain refills of medications.  Will refill Lantus and start on low-dose of Ozempic given he has been off of Ozempic now for about a month and may have side effects restarting on higher dose.  Patient will need to return in 1 month for up titration of Ozempic. Plan: -Refilled Lantus 25 units daily -Start Ozempic 0.5 mg once weekly for 4 weeks -Follow-up in 4 weeks for up titration of Ozempic -Ambulatory referral to ophthalmology -We will need urine creatinine/microalbumin ratio at next OV  Hyperlipidemia associated with type 2 diabetes mellitus ([place]) Last lipid panel July 2022 with total cholesterol 177, triglycerides 82, HDL 42, LDL 118.  Patient previously prescribed lovastatin 20 mg daily.  He was tolerating this well.  He ran out of this medication about a month ago. Plan: -Continue lovastatin 20 mg daily -Continue to encourage lifestyle modifications  Morbid obesity (HCC) BMI 51 today.  Patient weighs 369 pounds.  Prior to starting Ozempic patient weighed 380 pounds and after starting Ozempic he was able to get  down to 350 pounds.  Unfortunately he has been out of his Ozempic for about a month and he has gained the weight back.  He reports exercising every other day by walking up and down stairs of his apartment complex.  His girlfriend has also helped him restrict fried foods and stick to baked foods. Plan: -Continue lifestyle modifications- -We will restart Ozempic -May  benefit from referral to [facility] for nutrition counseling or referral to exercise program   Healthcare maintenance Patient declines HIV testing.  Please discuss colon cancer screening and vaccinations at next appointment.  Schizophrenia [facility]) Patient is to follow-up with Dr. Rosine Door, psychiatrist, though his office closed a few years back.  Referral was placed in 2020 to psychiatry though was denied.  Today patient endorses visual hallucinations, seeing black figures, as well as auditory hallucinations.  These do not currently cause him distress and are not significantly disruptive to life.  He endorses his son as a source of support to help him deal with the symptoms and his anxiety. On assessment, patient has history of schizophrenia with occasional hallucinations, no flat affect, thoughts linear, not a danger to self or others at this time.  Patient used to take Risperdal but has been off this for several months and is managing all right off treatment.  We will hold off on restarting Risperdal for now. Plan: -Ambulatory referral to psychiatry  Anxiety disorder Patient reports symptoms anxiety including getting anxious when he leaves the home and walks into stores.  He has been off his Xanax for several months, last filled September 2022.  Per chart review, patient has tried SSRIs in the past, unclear which ones he tried or for how long he trialed them.    Patient has symptoms of mild to moderate anxiety which he has been managing without medication for the last several months.  Given diagnosis of schizophrenia, will defer further management of psychiatric conditions to psychiatry Plan: -Ambulatory referral to psychiatry    Past Medical History:  Diagnosis Date   Hypertension    Sleep apnea     Current Outpatient Medications on File Prior to Visit  Medication Sig Dispense Refill   Accu-Chek Softclix Lancets lancets Use to test blood sugar 1-2 times daily. Dx:e11.9 200 each 1   ALPRAZolam  (XANAX) 0.5 MG tablet Take 0.5-1 tablets (0.25-0.5 mg total) by mouth 2 (two) times daily as needed for anxiety. Continue following with psychiatrist. (Patient not taking: Reported on 07/29/2021) 60 tablet 1   BD PEN NEEDLE NANO 2ND GEN 32G X 4 MM MISC USE WITH INSULIN PEN 100 each 2   Blood Glucose Monitoring Suppl (ACCU-CHEK GUIDE) w/Device KIT Use to check blood sugars 1-2 times daily. Dx:E11.9 1 kit 0   glucose blood (ACCU-CHEK GUIDE) test strip Use to test blood sugar 1-2 times daily. Dx:e11.9 200 each 1   meloxicam (MOBIC) 15 MG tablet Take 1 tablet (15 mg total) by mouth daily. 14 tablet 0   risperiDONE (RISPERDAL) 2 MG tablet TAKE 1 TABLET(2 MG) BY MOUTH DAILY. CONTINUE FOLLOWING WITH PSYCHIATRIST (Patient not taking: Reported on 07/29/2021) 30 tablet 0   sildenafil (REVATIO) 20 MG tablet TAKE 2 TABLETS(40 MG) BY MOUTH DAILY AS NEEDED 180 tablet 0   No current facility-administered medications on file prior to visit.    Family History  Problem Relation Age of Onset   Breast cancer Mother    Alcohol abuse Father    Hypertension Sister    Breast cancer Sister    Hypertension  Brother    Diabetes type II Brother     Social History: Patient lives in [place].  Patient used to drink more regularly but in the last 2 years has only been drinking once, 1-2 drinks over the course of 2 to 3 months.  Patient endorses occasional marijuana use.  Patient is a never cigarette smoker and never smokeless tobacco user.  Denies vaping.  Has 1 son who also lives in [place].  Review of Systems: ROS negative except for what is noted on the assessment and plan.    Vitals:   12/03/21 1335  BP: (!) 170/95  Pulse: (!) 118  Temp: 98.2 F (36.8 C)  TempSrc: Oral  SpO2: 97%  Weight: (!) 369 lb 4.8 oz (167.5 kg)  Height: _0  (1.803 m)     Physical Exam: General: Well appearing morbidly obese African-American male, NAD HENT: normocephalic, atraumatic, external ears and nares appear  unremarkable EYES: conjunctiva non-erythematous, no scleral icterus CV: regular rate, normal rhythm, no murmurs, rubs, gallops.  Trace LEE Pulmonary: normal work of breathing on RA, lungs clear to auscultation, no rales, wheezes, rhonchi Abdominal: Obese, soft, non-tender to palpation, normal BS Skin: Warm and dry, no rashes or lesions on exposed surfaces Neurological: MS: awake, alert and oriented x3, normal speech and fund of knowledge Motor: moves all extremities antigravity Psych: normal affect     See Encounters Tab for problem based charting.  Patient discussed with Dr. Lonzo Cloud, M.D. [facility], PGY-1 Pager: [phone] Date 12/03/2021 Time 9:07 PM

## 2021-12-03 NOTE — Assessment & Plan Note (Signed)
Patient reports adherence to CPAP nightly.  No problems with using his machine.  His sleep has significantly improved since starting CPAP use several years ago.

## 2021-12-03 NOTE — Assessment & Plan Note (Signed)
Last lipid panel July 2022 with total cholesterol 177, triglycerides 82, HDL 42, LDL 118.  Patient previously prescribed lovastatin 20 mg daily.  He was tolerating this well.  He ran out of this medication about a month ago. Plan: -Continue lovastatin 20 mg daily -Continue to encourage lifestyle modifications

## 2021-12-03 NOTE — Assessment & Plan Note (Signed)
Patient has history of type 2 diabetes mellitus, was previously prescribed Lantus 25 units daily and on Ozempic 1.5 mg weekly though has been unable to obtain refills since he was dismissed from prior PCP.  He has been out of his medications for about 1 month.  Last hemoglobin A1c July 2022 was 8.5%.  Hemoglobin A1c today 10.3%.  Patient reported was losing weight on Ozempic and overall was feeling better while taking Ozempic.  He has tried metformin in the past though was unable to tolerate this due to GI side effects.  He is making an active attempt to exercise every other day by walking up and down stairs in his apartment complex as well as to eliminate fried foods from his diet and eat only baked foods.  He is to follow-up with an eye doctor in [place], does not have an eye doctor here in [place].  On assessment, type 2 diabetes mellitus poorly controlled in the setting of patient unable to obtain refills of medications.  Will refill Lantus and start on low-dose of Ozempic given he has been off of Ozempic now for about a month and may have side effects restarting on higher dose.  Patient will need to return in 1 month for up titration of Ozempic. Plan: -Refilled Lantus 25 units daily -Start Ozempic 0.5 mg once weekly for 4 weeks -Follow-up in 4 weeks for up titration of Ozempic -Ambulatory referral to ophthalmology -We will need urine creatinine/microalbumin ratio at next OV

## 2021-12-04 LAB — BMP8+ANION GAP
Anion Gap: 16 mmol/L (ref 10.0–18.0)
BUN/Creatinine Ratio: 14 (ref 9–20)
BUN: 13 mg/dL (ref 6–24)
CO2: 23 mmol/L (ref 20–29)
Calcium: 9.4 mg/dL (ref 8.7–10.2)
Chloride: 97 mmol/L (ref 96–106)
Creatinine, Ser: 0.9 mg/dL (ref 0.76–1.27)
Glucose: 348 mg/dL — ABNORMAL HIGH (ref 70–99)
Potassium: 4.3 mmol/L (ref 3.5–5.2)
Sodium: 136 mmol/L (ref 134–144)
eGFR: 101 mL/min/{1.73_m2} (ref >59)

## 2021-12-04 NOTE — Progress Notes (Signed)
Internal Medicine Clinic Attending ? ?Case discussed with Dr. Zinoviev  At the time of the visit.  We reviewed the resident?s history and exam and pertinent patient test results.  I agree with the assessment, diagnosis, and plan of care documented in the resident?s note.  ?

## 2021-12-11 ENCOUNTER — Telehealth: Payer: Medicare Other | Admitting: Family Medicine

## 2021-12-11 DIAGNOSIS — M25511 Pain in right shoulder: Secondary | ICD-10-CM

## 2021-12-11 MED ORDER — PREDNISONE 10 MG (21) PO TBPK: ORAL_TABLET | ORAL | 0 refills | Status: DC

## 2021-12-11 NOTE — Progress Notes (Deleted)
Psychiatric Initial Adult Assessment   Patient Identification: John [name] MRN:  [id number] Date of Evaluation:  12/11/2021 Referral Source: *** Chief Complaint:  No chief complaint on file.  Visit Diagnosis: No diagnosis found.  History of Present Illness:   John [name] is a 56 y.o. year old male with a history of schizophrenia, anxiety, hypertension, type II diabetes, hyperlipidemia, obesity, OSA, , who is referred for schizophrenia.   Daily routine: Diet:  Exercise: Support: Household:  Marital status: Number of children: Employment:  Education:   Last PCP / ongoing medical evaluation:           Associated Signs/Symptoms: Depression Symptoms:  {DEPRESSION SYMPTOMS:20000} (Hypo) Manic Symptoms:  {BHH MANIC SYMPTOMS:22872} Anxiety Symptoms:  {BHH ANXIETY SYMPTOMS:22873} Psychotic Symptoms:  {BHH PSYCHOTIC SYMPTOMS:22874} PTSD Symptoms: {BHH PTSD SYMPTOMS:22875}  Past Psychiatric History:  Outpatient:  Psychiatry admission:  Previous suicide attempt:  Past trials of medication:  History of violence:    Previous Psychotropic Medications: {YES/NO:21197}  Substance Abuse History in the last 12 months:  {yes no:314532}  Consequences of Substance Abuse: {BHH CONSEQUENCES OF SUBSTANCE ABUSE:22880}  Past Medical History:  Past Medical History:  Diagnosis Date   Anxiety disorder    Erectile dysfunction    Hyperlipidemia    Hypertension    Obesity, morbid, BMI 50 or higher ([place])    Poorly controlled type 2 diabetes mellitus ([place])    Schizophrenia ([place])    Sleep apnea     Past Surgical History:  Procedure Laterality Date   ANKLE SURGERY     APPENDECTOMY      Family Psychiatric History: ***  Family History:  Family History  Problem Relation Age of Onset   Breast cancer Mother    Alcohol abuse Father    Hypertension Sister    Breast cancer Sister    Hypertension Brother    Diabetes type II Brother     Social History:   Social History    Socioeconomic History   Marital status: Single    Spouse name: Not on file   Number of children: Not on file   Years of education: Not on file   Highest education level: Not on file  Occupational History   Not on file  Tobacco Use   Smoking status: Never   Smokeless tobacco: Never  Vaping Use   Vaping Use: Never used  Substance and Sexual Activity   Alcohol use: Yes    Comment: rare in the last two years, 1x in 2-3 months   Drug use: Yes    Types: Marijuana    Comment: Occasional   Sexual activity: Yes  Other Topics Concern   Not on file  Social History Narrative   Not on file   Social Determinants of Health   Financial Resource Strain: Medium Risk   Difficulty of Paying Living Expenses: Somewhat hard  Food Insecurity: No Food Insecurity   Worried About Charity fundraiser in the Last Year: Never true   Ran Out of Food in the Last Year: Never true  Transportation Needs: No Transportation Needs   Lack of Transportation (Medical): No   Lack of Transportation (Non-Medical): No  Physical Activity: Inactive   Days of Exercise per Week: 0 days   Minutes of Exercise per Session: 0 min  Stress: Stress Concern Present   Feeling of Stress : To some extent  Social Connections: Not on file    Additional Social History: ***  Allergies:   Allergies  Allergen Reactions  Aspirin Nausea Only    Metabolic Disorder Labs: Lab Results  Component Value Date   HGBA1C 10.3 (A) 12/03/2021   No results found for: PROLACTIN Lab Results  Component Value Date   CHOL 177 02/04/2021   TRIG 82.0 02/04/2021   HDL 42.30 02/04/2021   CHOLHDL 4 02/04/2021   VLDL 16.4 02/04/2021   LDLCALC 118 (H) 02/04/2021   LDLCALC 119 (H) 07/25/2019   No results found for: TSH  Therapeutic Level Labs: No results found for: LITHIUM No results found for: CBMZ No results found for: VALPROATE  Current Medications: Current Outpatient Medications  Medication Sig Dispense Refill   Accu-Chek  Softclix Lancets lancets Use to test blood sugar 1-2 times daily. Dx:e11.9 200 each 1   ALPRAZolam (XANAX) 0.5 MG tablet Take 0.5-1 tablets (0.25-0.5 mg total) by mouth 2 (two) times daily as needed for anxiety. Continue following with psychiatrist. (Patient not taking: Reported on 07/29/2021) 60 tablet 1   amLODipine (NORVASC) 5 MG tablet Take 1 tablet (5 mg total) by mouth at bedtime. 90 tablet 2   BD PEN NEEDLE NANO 2ND GEN 32G X 4 MM MISC USE WITH INSULIN PEN 100 each 2   Blood Glucose Monitoring Suppl (ACCU-CHEK GUIDE) w/Device KIT Use to check blood sugars 1-2 times daily. Dx:E11.9 1 kit 0   glucose blood (ACCU-CHEK GUIDE) test strip Use to test blood sugar 1-2 times daily. Dx:e11.9 200 each 1   insulin glargine (LANTUS SOLOSTAR) 100 UNIT/ML Solostar Pen Inject 25 Units into the skin daily. 30 mL 1   lisinopril-hydrochlorothiazide (ZESTORETIC) 20-12.5 MG tablet Take 2 tablets by mouth daily. 90 tablet 2   lovastatin (MEVACOR) 20 MG tablet Take 1 tablet (20 mg total) by mouth at bedtime. 90 tablet 3   meloxicam (MOBIC) 15 MG tablet Take 1 tablet (15 mg total) by mouth daily. 14 tablet 0   predniSONE (STERAPRED UNI-PAK 21 TAB) 10 MG (21) TBPK tablet Take as directed 21 tablet 0   risperiDONE (RISPERDAL) 2 MG tablet TAKE 1 TABLET(2 MG) BY MOUTH DAILY. CONTINUE FOLLOWING WITH PSYCHIATRIST (Patient not taking: Reported on 07/29/2021) 30 tablet 0   Semaglutide,0.25 or 0.5MG/DOS, (OZEMPIC, 0.25 OR 0.5 MG/DOSE,) 2 MG/1.5ML SOPN Inject 0.5 mg into the skin once a week. 1.5 mL 0   sildenafil (REVATIO) 20 MG tablet TAKE 2 TABLETS(40 MG) BY MOUTH DAILY AS NEEDED 180 tablet 0   No current facility-administered medications for this visit.    Musculoskeletal: Strength & Muscle Tone:  N/A Gait & Station:  N/A Patient leans: N/A  Psychiatric Specialty Exam: Review of Systems  There were no vitals taken for this visit.There is no height or weight on file to calculate BMI.  General Appearance:  {Appearance:22683}  Eye Contact:  {BHH EYE CONTACT:22684}  Speech:  Clear and Coherent  Volume:  Normal  Mood:  {BHH MOOD:22306}  Affect:  {Affect (PAA):22687}  Thought Process:  Coherent  Orientation:  Full (Time, Place, and Person)  Thought Content:  Logical  Suicidal Thoughts:  {ST/HT (PAA):22692}  Homicidal Thoughts:  {ST/HT (PAA):22692}  Memory:  Immediate;   Good  Judgement:  {Judgement (PAA):22694}  Insight:  {Insight (PAA):22695}  Psychomotor Activity:  Normal  Concentration:  Concentration: Good and Attention Span: Good  Recall:  Good  Fund of Knowledge:Good  Language: Good  Akathisia:  No  Handed:  Right  AIMS (if indicated):  not done  Assets:  Communication Skills Desire for Improvement  ADL's:  Intact  Cognition: WNL  Sleep:  {BHH  GOOD/FAIR/POOR:22877}   Screenings: PHQ2-9    Flowsheet Row Office Visit from 12/03/2021 in [facility] from 07/29/2021 in [facility] at [facility] from 07/23/2020 in [facility] from 07/25/2019 in [facility] at [facility]  PHQ-2 Total Score 2 1 0 0  PHQ-9 Total Score 7 -- -- --      Flowsheet Row ED from 11/26/2021 in [facility] [place] No Risk       Assessment and Plan:  Assessment  Plan   The patient demonstrates the following risk factors for suicide: Chronic risk factors for suicide include: {Chronic Risk Factors for BOERQSX:28208138}. Acute risk factors for suicide include: {Acute Risk Factors for ITJLLVD:47185501}. Protective factors for this patient include: {Protective Factors for Suicide TAEW:25749355}. Considering these factors, the overall suicide risk at this point appears to be {Desc; low/moderate/high:110033}. Patient {ACTION; IS/IS EZV:47159539} appropriate for outpatient follow up.   Collaboration of Care: {BH OP Collaboration of Care:21014065}  Patient/Guardian was  advised Release of Information must be obtained prior to any record release in order to collaborate their care with an outside provider. Patient/Guardian was advised if they have not already done so to contact the registration department to sign all necessary forms in order for [place] to release information regarding their care.   Consent: Patient/Guardian gives verbal consent for treatment and assignment of benefits for services provided during this visit. Patient/Guardian expressed understanding and agreed to proceed.   Norman Clay, MD 6/1/202311:08 AM

## 2021-12-11 NOTE — Progress Notes (Signed)
Virtual Visit Consent   John [name], you are scheduled for a virtual visit with a [place] provider today. Just as with appointments in the office, your consent must be obtained to participate. Your consent will be active for this visit and any virtual visit you may have with one of our providers in the next 365 days. If you have a MyChart account, a copy of this consent can be sent to you electronically.  As this is a virtual visit, video technology does not allow for your provider to perform a traditional examination. This may limit your provider's ability to fully assess your condition. If your provider identifies any concerns that need to be evaluated in person or the need to arrange testing (such as labs, EKG, etc.), we will make arrangements to do so. Although advances in technology are sophisticated, we cannot ensure that it will always work on either your end or our end. If the connection with a video visit is poor, the visit may have to be switched to a telephone visit. With either a video or telephone visit, we are not always able to ensure that we have a secure connection.  By engaging in this virtual visit, you consent to the provision of healthcare and authorize for your insurance to be billed (if applicable) for the services provided during this visit. Depending on your insurance coverage, you may receive a charge related to this service.  I need to obtain your verbal consent now. Are you willing to proceed with your visit today? John [name] has provided verbal consent on 12/11/2021 for a virtual visit (video or telephone). Perlie Mayo, NP  Date: 12/11/2021 10:09 AM  Virtual Visit via Video Note   I, Perlie Mayo, connected with  John [name]  ([id number], May 24, 1966) on 12/11/21 at 10:15 AM EDT by a video-enabled telemedicine application and verified that I am speaking with the correct person using two identifiers.  Location: Patient: Virtual Visit Location Patient:  Home Provider: Virtual Visit Location Provider: Home Office   I discussed the limitations of evaluation and management by telemedicine and the availability of in person appointments. The patient expressed understanding and agreed to proceed.    History of Present Illness: John [name] is a 56 y.o. who identifies as a male who was assigned male at birth, and is being seen today for right shoulder pain- on going for over a week. Consistent. Described as throbbing pain. Some sensation changes with thumb feeling tingling. Worse with cross body movements. No trauma or injury he is aware. Denies neck trouble in past or sx in that area.  Was seen in ED on 5/17 for this and r/o cardiac related concerns. BP was high and HR was high.   Past Medical, Surgical, Social History, Allergies, and Medications have been Reviewed.   Problems:  Patient Active Problem List   Diagnosis Date Noted   Healthcare maintenance 12/03/2021   Erectile dysfunction 02/28/2020   Hyperlipidemia associated with type 2 diabetes mellitus ([place]) 10/18/2019   Type 2 diabetes mellitus with unspecified complications ([place]) 57/26/2035   Schizophrenia ([place]) 07/25/2019   Anxiety disorder 07/25/2019   OSA (obstructive sleep apnea) 08/17/2013   Morbid obesity ([place]) 08/17/2013   Hypertension 08/17/2013    Allergies:  Allergies  Allergen Reactions   Aspirin Nausea Only   Medications:  Current Outpatient Medications:    Accu-Chek Softclix Lancets lancets, Use to test blood sugar 1-2 times daily. Dx:e11.9, Disp: 200 each, Rfl: 1  ALPRAZolam (XANAX) 0.5 MG tablet, Take 0.5-1 tablets (0.25-0.5 mg total) by mouth 2 (two) times daily as needed for anxiety. Continue following with psychiatrist. (Patient not taking: Reported on 07/29/2021), Disp: 60 tablet, Rfl: 1   amLODipine (NORVASC) 5 MG tablet, Take 1 tablet (5 mg total) by mouth at bedtime., Disp: 90 tablet, Rfl: 2   BD PEN NEEDLE NANO 2ND GEN 32G X 4 MM MISC, USE WITH INSULIN PEN,  Disp: 100 each, Rfl: 2   Blood Glucose Monitoring Suppl (ACCU-CHEK GUIDE) w/Device KIT, Use to check blood sugars 1-2 times daily. Dx:E11.9, Disp: 1 kit, Rfl: 0   glucose blood (ACCU-CHEK GUIDE) test strip, Use to test blood sugar 1-2 times daily. Dx:e11.9, Disp: 200 each, Rfl: 1   insulin glargine (LANTUS SOLOSTAR) 100 UNIT/ML Solostar Pen, Inject 25 Units into the skin daily., Disp: 30 mL, Rfl: 1   lisinopril-hydrochlorothiazide (ZESTORETIC) 20-12.5 MG tablet, Take 2 tablets by mouth daily., Disp: 90 tablet, Rfl: 2   lovastatin (MEVACOR) 20 MG tablet, Take 1 tablet (20 mg total) by mouth at bedtime., Disp: 90 tablet, Rfl: 3   meloxicam (MOBIC) 15 MG tablet, Take 1 tablet (15 mg total) by mouth daily., Disp: 14 tablet, Rfl: 0   risperiDONE (RISPERDAL) 2 MG tablet, TAKE 1 TABLET(2 MG) BY MOUTH DAILY. CONTINUE FOLLOWING WITH PSYCHIATRIST (Patient not taking: Reported on 07/29/2021), Disp: 30 tablet, Rfl: 0   Semaglutide,0.25 or 0.5MG/DOS, (OZEMPIC, 0.25 OR 0.5 MG/DOSE,) 2 MG/1.5ML SOPN, Inject 0.5 mg into the skin once a week., Disp: 1.5 mL, Rfl: 0   sildenafil (REVATIO) 20 MG tablet, TAKE 2 TABLETS(40 MG) BY MOUTH DAILY AS NEEDED, Disp: 180 tablet, Rfl: 0  Observations/Objective: Patient is well-developed, well-nourished in no acute distress.  Resting comfortably  at home.  Head is normocephalic, atraumatic.  No labored breathing.  Speech is clear and coherent with logical content.  Patient is alert and oriented at baseline.  RUE: shoulder ROM appears intact on video; when asked to cross body or lift arm up for ROM he reports tenderness and pain.  Able to tap fingers with ROM of fingers and wrist. Elbow ROM intact.  Video limits strength, vascular and nerve checks outside ROM.   Assessment and Plan:  1. Acute pain of right shoulder  - predniSONE (STERAPRED UNI-PAK 21 TAB) 10 MG (21) TBPK tablet; Take as directed  Dispense: 21 tablet; Refill: 0   Given mobic is not helpful, will consider  a nerve inflammation given reports of thumb tingling. Will try pred dose pack and advised follow up in person if not improved.  Advised can continue mobic Might benefit from PT and or ortho referral in future.   Reviewed side effects, risks and benefits of medication.    Patient acknowledged agreement and understanding of the plan.     Follow Up Instructions: I discussed the assessment and treatment plan with the patient. The patient was provided an opportunity to ask questions and all were answered. The patient agreed with the plan and demonstrated an understanding of the instructions.  A copy of instructions were sent to the patient via MyChart unless otherwise noted below.   The patient was advised to call back or seek an in-person evaluation if the symptoms worsen or if the condition fails to improve as anticipated.  Time:  I spent 15 minutes with the patient via telehealth technology discussing the above problems/concerns.    Perlie Mayo, NP

## 2021-12-11 NOTE — Patient Instructions (Signed)

## 2021-12-13 ENCOUNTER — Telehealth (HOSPITAL_COMMUNITY): Payer: Medicare Other | Admitting: Psychiatry

## 2021-12-17 ENCOUNTER — Ambulatory Visit (HOSPITAL_COMMUNITY): Payer: Self-pay

## 2021-12-18 ENCOUNTER — Encounter: Payer: Medicare Other | Admitting: Internal Medicine

## 2021-12-18 NOTE — Progress Notes (Deleted)
   CC: right shoulder pain.  HPI:  Mr.Ignatz D Tauzin is a 56 y.o. with medical history as below presenting to [facility] for right shoulder pain.   Please see problem-based list for further details, assessments, and plans.  Past Medical History:  Diagnosis Date   Anxiety disorder    Erectile dysfunction    Hyperlipidemia    Hypertension    Obesity, morbid, BMI 50 or higher ([place])    Poorly controlled type 2 diabetes mellitus ([facility])    Schizophrenia ([facility])    Sleep apnea     Current Outpatient Medications (Endocrine & Metabolic):    insulin glargine (LANTUS SOLOSTAR) 100 UNIT/ML Solostar Pen, Inject 25 Units into the skin daily.   predniSONE (STERAPRED UNI-PAK 21 TAB) 10 MG (21) TBPK tablet, Take as directed   Semaglutide,0.25 or 0.5MG /DOS, (OZEMPIC, 0.25 OR 0.5 MG/DOSE,) 2 MG/1.5ML SOPN, Inject 0.5 mg into the skin once a week.  Current Outpatient Medications (Cardiovascular):    amLODipine (NORVASC) 5 MG tablet, Take 1 tablet (5 mg total) by mouth at bedtime.   lisinopril-hydrochlorothiazide (ZESTORETIC) 20-12.5 MG tablet, Take 2 tablets by mouth daily.   lovastatin (MEVACOR) 20 MG tablet, Take 1 tablet (20 mg total) by mouth at bedtime.   sildenafil (REVATIO) 20 MG tablet, TAKE 2 TABLETS(40 MG) BY MOUTH DAILY AS NEEDED   Current Outpatient Medications (Analgesics):    meloxicam (MOBIC) 15 MG tablet, Take 1 tablet (15 mg total) by mouth daily.   Current Outpatient Medications (Other):    Accu-Chek Softclix Lancets lancets, Use to test blood sugar 1-2 times daily. Dx:e11.9   ALPRAZolam (XANAX) 0.5 MG tablet, Take 0.5-1 tablets (0.25-0.5 mg total) by mouth 2 (two) times daily as needed for anxiety. Continue following with psychiatrist. (Patient not taking: Reported on 07/29/2021)   BD PEN NEEDLE NANO 2ND GEN 32G X 4 MM MISC, USE WITH INSULIN PEN   Blood Glucose Monitoring Suppl (ACCU-CHEK GUIDE) w/Device KIT, Use to check blood sugars 1-2 times daily. Dx:E11.9   glucose blood (ACCU-CHEK  GUIDE) test strip, Use to test blood sugar 1-2 times daily. Dx:e11.9   risperiDONE (RISPERDAL) 2 MG tablet, TAKE 1 TABLET(2 MG) BY MOUTH DAILY. CONTINUE FOLLOWING WITH PSYCHIATRIST (Patient not taking: Reported on 07/29/2021)  Review of Systems:  Review of system negative unless stated in the problem list or HPI.    Physical Exam:  There were no vitals filed for this visit.  Physical Exam General: NAD HENT: NCAT Lungs: CTAB, no wheeze, rhonchi or rales.  Cardiovascular: Normal heart sounds, no r/m/g, 2+ pulses in all extremities. No LE edema Abdomen: No TTP, normal bowel sounds MSK: No asymmetry or muscle atrophy.  Skin: no lesions noted on exposed skin Neuro: Alert and oriented x4. CN grossly intact Psych: Normal mood and normal affect   Assessment & Plan:   See Encounters Tab for problem based charting.  Patient discussed with Dr. {NAMES:[id number]::"Butcher","Guilloud","Hoffman","Mullen","Narendra","Raines","Vincent"} Idamae Schuller, MD  56 year old male PMHx of HTN, HLD, T2DM, OSA, schizophrenia   Right Shoulder Pain

## 2021-12-30 ENCOUNTER — Other Ambulatory Visit: Payer: Self-pay | Admitting: Family Medicine

## 2021-12-30 DIAGNOSIS — M25511 Pain in right shoulder: Secondary | ICD-10-CM

## 2021-12-31 NOTE — Telephone Encounter (Signed)
error 

## 2022-01-05 ENCOUNTER — Telehealth: Payer: Medicare Other | Admitting: Physician Assistant

## 2022-01-05 DIAGNOSIS — K047 Periapical abscess without sinus: Secondary | ICD-10-CM

## 2022-01-08 ENCOUNTER — Other Ambulatory Visit: Payer: Self-pay

## 2022-01-08 DIAGNOSIS — E118 Type 2 diabetes mellitus with unspecified complications: Secondary | ICD-10-CM

## 2022-01-08 MED ORDER — OZEMPIC (0.25 OR 0.5 MG/DOSE) 2 MG/1.5ML ~~LOC~~ SOPN: 0.5000 mg | PEN_INJECTOR | SUBCUTANEOUS | 0 refills | Status: DC

## 2022-01-08 NOTE — Telephone Encounter (Signed)
Ozempic

## 2022-01-09 ENCOUNTER — Ambulatory Visit (HOSPITAL_COMMUNITY): Admit: 2022-01-09 | Discharge status: Against Medical Advice | Payer: Medicare Other

## 2022-01-09 ENCOUNTER — Telehealth: Payer: Medicare Other | Admitting: Physician Assistant

## 2022-01-09 DIAGNOSIS — M5412 Radiculopathy, cervical region: Secondary | ICD-10-CM

## 2022-01-09 MED ORDER — GABAPENTIN 100 MG PO CAPS: 100.0000 mg | ORAL_CAPSULE | Freq: Three times a day (TID) | ORAL | 0 refills | Status: DC | PRN

## 2022-01-09 NOTE — Progress Notes (Signed)
Virtual Visit Consent   John [name], you are scheduled for a virtual visit with a [place] provider today. Just as with appointments in the office, your consent must be obtained to participate. Your consent will be active for this visit and any virtual visit you may have with one of our providers in the next 365 days. If you have a MyChart account, a copy of this consent can be sent to you electronically.  As this is a virtual visit, video technology does not allow for your provider to perform a traditional examination. This may limit your provider's ability to fully assess your condition. If your provider identifies any concerns that need to be evaluated in person or the need to arrange testing (such as labs, EKG, etc.), we will make arrangements to do so. Although advances in technology are sophisticated, we cannot ensure that it will always work on either your end or our end. If the connection with a video visit is poor, the visit may have to be switched to a telephone visit. With either a video or telephone visit, we are not always able to ensure that we have a secure connection.  By engaging in this virtual visit, you consent to the provision of healthcare and authorize for your insurance to be billed (if applicable) for the services provided during this visit. Depending on your insurance coverage, you may receive a charge related to this service.  I need to obtain your verbal consent now. Are you willing to proceed with your visit today? John [name] has provided verbal consent on 01/09/2022 for a virtual visit (video or telephone).  M , PA-C  Date: 01/09/2022 5:51 PM  Virtual Visit via Video Note   I,  M , connected with  John [name]  ([id number], 02/23/1966) on 01/09/22 at  5:30 PM EDT by a video-enabled telemedicine application and verified that I am speaking with the correct person using two identifiers.  Location: Patient: Virtual Visit Location  Patient: Home Provider: Virtual Visit Location Provider: Home Office   I discussed the limitations of evaluation and management by telemedicine and the availability of in person appointments. The patient expressed understanding and agreed to proceed.    History of Present Illness: John [name] is a 55 y.o. who identifies as a male who was assigned male at birth, and is being seen today for right arm pain.  HPI: Shoulder Pain  The pain is present in the neck, right arm, right shoulder, right fingers, right hand, right wrist and right elbow. This is a recurrent problem. The current episode started more than 1 month ago (since 11/2021). There has been no history of extremity trauma. The problem occurs constantly. The problem has been gradually worsening. The quality of the pain is described as aching. The pain is moderate. Associated symptoms include a limited range of motion, numbness (in right thumb) and stiffness. Pertinent negatives include no fever, inability to bear weight or tingling. Associated symptoms comments: Trouble opening pull tops. The symptoms are aggravated by activity. He has tried acetaminophen and NSAIDS (oral prednisone 6 day taper) for the symptoms. The treatment provided no relief. Family history does not include gout or rheumatoid arthritis. His past medical history is significant for diabetes.      Problems:  Patient Active Problem List   Diagnosis Date Noted   Healthcare maintenance 12/03/2021   Erectile dysfunction 02/28/2020   Hyperlipidemia associated with type 2 diabetes mellitus ([facility]) 10/18/2019   Type 2 diabetes mellitus   with unspecified complications ([facility]) 07/26/2019   Schizophrenia ([facility]) 07/25/2019   Anxiety disorder 07/25/2019   OSA (obstructive sleep apnea) 08/17/2013   Morbid obesity ([facility]CC) 08/17/2013   Hypertension 08/17/2013    Allergies:  Allergies  Allergen Reactions   Aspirin Nausea Only   Medications:  Current Outpatient Medications:     gabapentin (NEURONTIN) 100 MG capsule, Take 1 capsule (100 mg total) by mouth 3 (three) times daily as needed., Disp: 60 capsule, Rfl: 0   Accu-Chek Softclix Lancets lancets, Use to test blood sugar 1-2 times daily. Dx:e11.9, Disp: 200 each, Rfl: 1   ALPRAZolam (XANAX) 0.5 MG tablet, Take 0.5-1 tablets (0.25-0.5 mg total) by mouth 2 (two) times daily as needed for anxiety. Continue following with psychiatrist. (Patient not taking: Reported on 07/29/2021), Disp: 60 tablet, Rfl: 1   amLODipine (NORVASC) 5 MG tablet, Take 1 tablet (5 mg total) by mouth at bedtime., Disp: 90 tablet, Rfl: 2   BD PEN NEEDLE NANO 2ND GEN 32G X 4 MM MISC, USE WITH INSULIN PEN, Disp: 100 each, Rfl: 2   Blood Glucose Monitoring Suppl (ACCU-CHEK GUIDE) w/Device KIT, Use to check blood sugars 1-2 times daily. Dx:E11.9, Disp: 1 kit, Rfl: 0   glucose blood (ACCU-CHEK GUIDE) test strip, Use to test blood sugar 1-2 times daily. Dx:e11.9, Disp: 200 each, Rfl: 1   insulin glargine (LANTUS SOLOSTAR) 100 UNIT/ML Solostar Pen, Inject 25 Units into the skin daily., Disp: 30 mL, Rfl: 1   lisinopril-hydrochlorothiazide (ZESTORETIC) 20-12.5 MG tablet, Take 2 tablets by mouth daily., Disp: 90 tablet, Rfl: 2   lovastatin (MEVACOR) 20 MG tablet, Take 1 tablet (20 mg total) by mouth at bedtime., Disp: 90 tablet, Rfl: 3   meloxicam (MOBIC) 15 MG tablet, Take 1 tablet (15 mg total) by mouth daily., Disp: 14 tablet, Rfl: 0   predniSONE (STERAPRED UNI-PAK 21 TAB) 10 MG (21) TBPK tablet, Take as directed, Disp: 21 tablet, Rfl: 0   risperiDONE (RISPERDAL) 2 MG tablet, TAKE 1 TABLET(2 MG) BY MOUTH DAILY. CONTINUE FOLLOWING WITH PSYCHIATRIST (Patient not taking: Reported on 07/29/2021), Disp: 30 tablet, Rfl: 0   Semaglutide,0.25 or 0.5MG/DOS, (OZEMPIC, 0.25 OR 0.5 MG/DOSE,) 2 MG/1.5ML SOPN, Inject 0.5 mg into the skin once a week., Disp: 1.5 mL, Rfl: 0   sildenafil (REVATIO) 20 MG tablet, TAKE 2 TABLETS(40 MG) BY MOUTH DAILY AS NEEDED, Disp: 180 tablet,  Rfl: 0  Observations/Objective: Patient is well-developed, well-nourished in no acute distress.  Resting comfortably at home.  Head is normocephalic, atraumatic.  No labored breathing.  Speech is clear and coherent with logical content.  Patient is alert and oriented at baseline.    Assessment and Plan: 1. Cervical radiculopathy - gabapentin (NEURONTIN) 100 MG capsule; Take 1 capsule (100 mg total) by mouth 3 (three) times daily as needed.  Dispense: 60 capsule; Refill: 0  - Right arm pain over a month, radiating from top of shoulder to thumb - Failed meloxicam, toradol IM, 6 day prednisone, tylenol, ibuprofen, flexeril - Suspect that pain is radiating from neck and should be evaluated for possible nerve root impingement/cervical radiculopathy/herniated cervical disc/DDD - Will add gabapentin for nerve pain as above - Advised to take flexeril at bedtime - Heat to neck/shoulder - Information on [facility] provided to patient via AVS for further orthopedic evaluation  Follow Up Instructions: I discussed the assessment and treatment plan with the patient. The patient was provided an opportunity to ask questions and all were answered. The patient agreed with the plan   and demonstrated an understanding of the instructions.  A copy of instructions were sent to the patient via MyChart unless otherwise noted below.    The patient was advised to call back or seek an in-person evaluation if the symptoms worsen or if the condition fails to improve as anticipated.  Time:  I spent 18 minutes with the patient via telehealth technology discussing the above problems/concerns.    Mar Daring, PA-C

## 2022-01-09 NOTE — Patient Instructions (Addendum)
[name], thank you for joining Mar Daring, PA-C for today's virtual visit.  While this provider is not your primary care provider (PCP), if your PCP is located in our provider database this encounter information will be shared with them immediately following your visit.  Consent: (Patient) John [name] provided verbal consent for this virtual visit at the beginning of the encounter.  Current Medications:  Current Outpatient Medications:    Accu-Chek Softclix Lancets lancets, Use to test blood sugar 1-2 times daily. Dx:e11.9, Disp: 200 each, Rfl: 1   ALPRAZolam (XANAX) 0.5 MG tablet, Take 0.5-1 tablets (0.25-0.5 mg total) by mouth 2 (two) times daily as needed for anxiety. Continue following with psychiatrist. (Patient not taking: Reported on 07/29/2021), Disp: 60 tablet, Rfl: 1   amLODipine (NORVASC) 5 MG tablet, Take 1 tablet (5 mg total) by mouth at bedtime., Disp: 90 tablet, Rfl: 2   BD PEN NEEDLE NANO 2ND GEN 32G X 4 MM MISC, USE WITH INSULIN PEN, Disp: 100 each, Rfl: 2   Blood Glucose Monitoring Suppl (ACCU-CHEK GUIDE) w/Device KIT, Use to check blood sugars 1-2 times daily. Dx:E11.9, Disp: 1 kit, Rfl: 0   glucose blood (ACCU-CHEK GUIDE) test strip, Use to test blood sugar 1-2 times daily. Dx:e11.9, Disp: 200 each, Rfl: 1   insulin glargine (LANTUS SOLOSTAR) 100 UNIT/ML Solostar Pen, Inject 25 Units into the skin daily., Disp: 30 mL, Rfl: 1   lisinopril-hydrochlorothiazide (ZESTORETIC) 20-12.5 MG tablet, Take 2 tablets by mouth daily., Disp: 90 tablet, Rfl: 2   lovastatin (MEVACOR) 20 MG tablet, Take 1 tablet (20 mg total) by mouth at bedtime., Disp: 90 tablet, Rfl: 3   meloxicam (MOBIC) 15 MG tablet, Take 1 tablet (15 mg total) by mouth daily., Disp: 14 tablet, Rfl: 0   predniSONE (STERAPRED UNI-PAK 21 TAB) 10 MG (21) TBPK tablet, Take as directed, Disp: 21 tablet, Rfl: 0   risperiDONE (RISPERDAL) 2 MG tablet, TAKE 1 TABLET(2 MG) BY MOUTH DAILY. CONTINUE FOLLOWING WITH  PSYCHIATRIST (Patient not taking: Reported on 07/29/2021), Disp: 30 tablet, Rfl: 0   Semaglutide,0.25 or 0.5MG/DOS, (OZEMPIC, 0.25 OR 0.5 MG/DOSE,) 2 MG/1.5ML SOPN, Inject 0.5 mg into the skin once a week., Disp: 1.5 mL, Rfl: 0   sildenafil (REVATIO) 20 MG tablet, TAKE 2 TABLETS(40 MG) BY MOUTH DAILY AS NEEDED, Disp: 180 tablet, Rfl: 0   Medications ordered in this encounter:  No orders of the defined types were placed in this encounter.    *If you need refills on other medications prior to your next appointment, please contact your pharmacy*  Follow-Up: Call back or seek an in-person evaluation if the symptoms worsen or if the condition fails to improve as anticipated.  Other Instructions [facility] [address], [place], [place] [address] URGENT CARE HOURS Monday - Friday: 8:00am to 8:00pm Saturday: 10:00am to 3:00pm [phone]  Herniated Disk  A herniated disk, also called a ruptured disk or slipped disk, occurs when a disk in the spine bulges out too far. Between the bones in the spine (vertebrae), there are oval disks that are made of a soft, spongy center filled with liquid that is surrounded by a tough outer ring. The disks connect the vertebrae, help the spine move, and keep the bones from rubbing against each other when you move. When you have a herniated disk, the spongy center of the disk bulges out or breaks through the outer ring. The spongy center can press on a nerve between the vertebrae and cause pain. This  can occur anywhere in the back or neck area, but the lower back is most commonly affected. What are the causes? This condition may be caused by: Age-related wear and tear. Sudden injury, such as a strain or sprain. What increases the risk? The following factors may make you more likely to develop this condition: Aging. This is the main risk factor for a herniated disk. Being a man who is 37-2 years old. Frequently doing activities that involve  heavy lifting, bending, or twisting. Not getting enough exercise. Being overweight. Using tobacco products. What are the signs or symptoms? Symptoms may vary depending on where your herniated disk is located. A herniated disk in the lower back may cause: Sharp pain in part of the hips, buttocks, or legs. Sharp pain in the lower back, spreading down through the leg into the foot (sciatica). A herniated disk in the neck may cause dizziness and vertigo. It may also cause pain or weakness in the neck, shoulder, or upper arm, forearm, or fingers. You may also have muscle weakness. You may have trouble: Lifting your leg or arm. Standing on your toes. Squeezing tightly with one of your hands. Other symptoms may include: Numbness or tingling in the affected areas of the hands, arms, feet, or legs. Inability to control when you urinate or when you have bowel movements. This is a rare but serious sign of a severe herniated disk in the lower back. How is this diagnosed? This condition may be diagnosed based on: Your symptoms. Your medical history. A physical exam. The exam may include: A straight-leg test. For this test, you will lie on your back while your health care provider lifts your leg, keeping your knee straight. If you feel pain, you likely have a herniated disk. Neurologic tests. These include checking for numbness, reflexes, muscle strength, and problems with posture. Imaging tests, such as: X-rays. MRI. CT scan. Electromyogram (EMG) to check the nerves that control muscles. How is this treated? Treatment for this condition may include: A period of light, painless activity for a few days to several weeks. Complete bed rest is not recommended. If you have a herniated disk in your lower back, avoid sitting as it increases pressure on the disk. Medicines. These may include: NSAIDs, such as ibuprofen, to help reduce pain and swelling. Muscle relaxants to prevent sudden tightening of  the back muscles (back spasms). Prescription pain medicines, if you have severe pain. Ice or heat therapy. Steroid injections in the area of the herniated disk. These can help reduce pain and swelling. Physical therapy to strengthen your back muscles. In many cases, symptoms go away with treatment over a period of days or weeks. You will most likely be free of symptoms after 3-4 months. If other treatments do not help to relieve your symptoms, you may need surgery. Follow these instructions at home: Medicines Take over-the-counter and prescription medicines only as told by your health care provider. Ask your health care provider if the medicine prescribed to you: Requires you to avoid driving or using heavy machinery. Can cause constipation. You may need to take these actions to prevent or treat constipation: Drink enough fluid to keep your urine pale yellow. Take over-the-counter or prescription medicines. Eat foods that are high in fiber, such as beans, whole grains, and fresh fruits and vegetables. Limit foods that are high in fat and processed sugars, such as fried or sweet foods. Managing pain, stiffness, and swelling     If directed, put ice on the painful  area. Icing can help to relieve pain. To do this: Put ice in a plastic bag. Place a towel between your skin and the bag. Leave the ice on for 20 minutes, 2-3 times a day. Remove the ice if your skin turns bright red. This is very important. If you cannot feel pain, heat, or cold, you have a greater risk of damage to the area. If directed, apply heat to the painful area as often as told by your health care provider. Heat can reduce the stiffness of your muscles. Use the heat source that your health care provider recommends, such as a moist heat pack or a heating pad. Place a towel between your skin and the heat source. Leave the heat on for 20-30 minutes. Remove the heat if your skin turns bright red. This is especially important  if you are unable to feel pain, heat, or cold. You may have a greater risk of getting burned. Activity Avoid strict bed rest but only do activities that are painless. Gradually return to your normal activities and exercise as told by your health care provider. Ask your health care provider what activities and exercises are safe for you. Use good posture. Avoid movements that cause pain. Do not lift anything that is heavier than 10 lb (4.5 kg), or the limit that you are told, until your health care provider says that it is safe. Do not sit or stand for long periods of time without changing positions. Do not sit for long periods of time without getting up and moving around. If physical therapy was prescribed, do exercises as told by your health care provider. Aim to strengthen muscles in your back and abdomen with exercises such as swimming or walking. General instructions Do not use any products that contain nicotine or tobacco, such as cigarettes, e-cigarettes, and chewing tobacco. These products can delay healing. If you need help quitting, ask your health care provider. Do not wear high-heeled shoes. Do not sleep on your abdomen. If you are overweight, work with your health care provider to lose weight safely. Keep all follow-up visits. This is important. How is this prevented? Maintain a healthy weight. Maintain physical fitness. Do at least 150 minutes of moderate-intensity exercise each week, such as brisk walking or water aerobics. When lifting objects: Keep your feet at least shoulder-width apart and tighten the muscles of your abdomen. Keep your spine neutral as you bend your knees and hips. It is important to lift using the strength of your legs, not your back. Do not lock your knees straight out. Always ask for help to lift heavy or awkward objects. Contact a health care provider if you: Have back pain or neck pain that does not get better after 6 weeks. Have severe pain in your  back, neck, legs, or arms. Develop numbness, tingling, or weakness anywhere in your body. Get help right away if: You cannot move your arms or legs. You cannot control when you urinate or have bowel movements. You feel dizzy or you faint. You have shortness of breath. These symptoms may represent a serious problem that is an emergency. Do not wait to see if the symptoms will go away. Get medical help right away. Call your local emergency services (911 in the [place] Do not drive yourself to the hospital. Summary A herniated disk, also called a ruptured disk or slipped disk, occurs when a disk in the spine bulges out too far (herniates). This condition may be caused by age-related wear and tear  or a sudden injury. Symptoms may vary depending on where your herniated disk is located. Treatment may include rest, medicines, ice or heat therapy, steroid injections, and physical therapy. If other treatments do not help to relieve your symptoms, you may need surgery. This information is not intended to replace advice given to you by your health care provider. Make sure you discuss any questions you have with your health care provider. Document Revised: 10/18/2019 Document Reviewed: 10/18/2019 Elsevier Patient Education  [place].   If you have been instructed to have an in-person evaluation today at a local Urgent Care facility, please use the link below. It will take you to a list of all of our available [facility], including address, phone number and hours of operation. Please do not delay care.  [facility]  If you or a family member do not have a primary care provider, use the link below to schedule a visit and establish care. When you choose a [facility] primary care physician or advanced practice provider, you gain a long-term partner in health. Find a Primary Care Provider  Learn more about [facility] in-office and virtual care options: [facility]

## 2022-02-02 DIAGNOSIS — G4733 Obstructive sleep apnea (adult) (pediatric): Secondary | ICD-10-CM | POA: Diagnosis not present

## 2022-02-04 ENCOUNTER — Telehealth: Payer: Medicare Other | Admitting: Physician Assistant

## 2022-02-04 DIAGNOSIS — M5412 Radiculopathy, cervical region: Secondary | ICD-10-CM | POA: Diagnosis not present

## 2022-02-04 MED ORDER — GABAPENTIN 100 MG PO CAPS: 100.0000 mg | ORAL_CAPSULE | Freq: Every day | ORAL | 0 refills | Status: DC

## 2022-02-04 MED ORDER — IBUPROFEN 600 MG PO TABS: 600.0000 mg | ORAL_TABLET | Freq: Three times a day (TID) | ORAL | 0 refills | Status: DC | PRN

## 2022-02-04 NOTE — Progress Notes (Signed)
Virtual Visit Consent   John [name], you are scheduled for a virtual visit with a [facility] provider today. Just as with appointments in the office, your consent must be obtained to participate. Your consent will be active for this visit and any virtual visit you may have with one of our providers in the next 365 days. If you have a MyChart account, a copy of this consent can be sent to you electronically.  As this is a virtual visit, video technology does not allow for your provider to perform a traditional examination. This may limit your provider's ability to fully assess your condition. If your provider identifies any concerns that need to be evaluated in person or the need to arrange testing (such as labs, EKG, etc.), we will make arrangements to do so. Although advances in technology are sophisticated, we cannot ensure that it will always work on either your end or our end. If the connection with a video visit is poor, the visit may have to be switched to a telephone visit. With either a video or telephone visit, we are not always able to ensure that we have a secure connection.  By engaging in this virtual visit, you consent to the provision of healthcare and authorize for your insurance to be billed (if applicable) for the services provided during this visit. Depending on your insurance coverage, you may receive a charge related to this service.  I need to obtain your verbal consent now. Are you willing to proceed with your visit today? John [name] has provided verbal consent on 02/04/2022 for a virtual visit (video or telephone). John Daring, PA-C  Date: 02/04/2022 2:09 PM  Virtual Visit via Video Note   I, John [name], connected with  John [name]  ([id number], 1965/11/21) on 02/04/22 at  2:00 PM EDT by a video-enabled telemedicine application and verified that I am speaking with the correct person using two identifiers.  Location: Patient: Virtual Visit Location  Patient: Home Provider: Virtual Visit Location Provider: Home Office   I discussed the limitations of evaluation and management by telemedicine and the availability of in person appointments. The patient expressed understanding and agreed to proceed.    History of Present Illness: John [name] is a 56 y.o. who identifies as a male who was assigned male at birth, and is being seen today for continued right arm pain. Symptoms started in May 2023. Has been seen 3 times (once in person and twice virtually) since onset. Has tried Toradol injection, meloxicam, flexeril, prednisone, and gabapentin.  States Gabapentin did help while he was on it, but taking it three times daily did make him "feel weird". He reports he had pretty good symptom relief until about 3 days ago symptoms started to return. He does have increased pain and radiating tingling into the thumb. Pain does radiate from top of shoulder through forearm and thumb. It is new to go to the thumb per patient. Worse with sleeping, eases off some while awake and up.    Problems:  Patient Active Problem List   Diagnosis Date Noted   Healthcare maintenance 12/03/2021   Erectile dysfunction 02/28/2020   Hyperlipidemia associated with type 2 diabetes mellitus ([place]) 10/18/2019   Type 2 diabetes mellitus with unspecified complications ([place]) 54/00/8676   Schizophrenia ([place]) 07/25/2019   Anxiety disorder 07/25/2019   OSA (obstructive sleep apnea) 08/17/2013   Morbid obesity ([place]) 08/17/2013   Hypertension 08/17/2013    Allergies:  Allergies  Allergen  Reactions   Aspirin Nausea Only   Medications:  Current Outpatient Medications:    gabapentin (NEURONTIN) 100 MG capsule, Take 1 capsule (100 mg total) by mouth at bedtime., Disp: 30 capsule, Rfl: 0   ibuprofen (ADVIL) 600 MG tablet, Take 1 tablet (600 mg total) by mouth every 8 (eight) hours as needed., Disp: 30 tablet, Rfl: 0   Accu-Chek Softclix Lancets lancets, Use to test blood sugar 1-2  times daily. Dx:e11.9, Disp: 200 each, Rfl: 1   ALPRAZolam (XANAX) 0.5 MG tablet, Take 0.5-1 tablets (0.25-0.5 mg total) by mouth 2 (two) times daily as needed for anxiety. Continue following with psychiatrist. (Patient not taking: Reported on 07/29/2021), Disp: 60 tablet, Rfl: 1   amLODipine (NORVASC) 5 MG tablet, Take 1 tablet (5 mg total) by mouth at bedtime., Disp: 90 tablet, Rfl: 2   BD PEN NEEDLE NANO 2ND GEN 32G X 4 MM MISC, USE WITH INSULIN PEN, Disp: 100 each, Rfl: 2   Blood Glucose Monitoring Suppl (ACCU-CHEK GUIDE) w/Device KIT, Use to check blood sugars 1-2 times daily. Dx:E11.9, Disp: 1 kit, Rfl: 0   glucose blood (ACCU-CHEK GUIDE) test strip, Use to test blood sugar 1-2 times daily. Dx:e11.9, Disp: 200 each, Rfl: 1   insulin glargine (LANTUS SOLOSTAR) 100 UNIT/ML Solostar Pen, Inject 25 Units into the skin daily., Disp: 30 mL, Rfl: 1   lisinopril-hydrochlorothiazide (ZESTORETIC) 20-12.5 MG tablet, Take 2 tablets by mouth daily., Disp: 90 tablet, Rfl: 2   lovastatin (MEVACOR) 20 MG tablet, Take 1 tablet (20 mg total) by mouth at bedtime., Disp: 90 tablet, Rfl: 3   risperiDONE (RISPERDAL) 2 MG tablet, TAKE 1 TABLET(2 MG) BY MOUTH DAILY. CONTINUE FOLLOWING WITH PSYCHIATRIST (Patient not taking: Reported on 07/29/2021), Disp: 30 tablet, Rfl: 0   Semaglutide,0.25 or 0.5MG/DOS, (OZEMPIC, 0.25 OR 0.5 MG/DOSE,) 2 MG/1.5ML SOPN, Inject 0.5 mg into the skin once a week., Disp: 1.5 mL, Rfl: 0   sildenafil (REVATIO) 20 MG tablet, TAKE 2 TABLETS(40 MG) BY MOUTH DAILY AS NEEDED, Disp: 180 tablet, Rfl: 0  Observations/Objective: Patient is well-developed, well-nourished in no acute distress.  Resting comfortably at home.  Head is normocephalic, atraumatic.  No labored breathing.  Speech is clear and coherent with logical content.  Patient is alert and oriented at baseline.    Assessment and Plan: 1. Cervical radiculopathy - gabapentin (NEURONTIN) 100 MG capsule; Take 1 capsule (100 mg total) by  mouth at bedtime.  Dispense: 30 capsule; Refill: 0 - ibuprofen (ADVIL) 600 MG tablet; Take 1 tablet (600 mg total) by mouth every 8 (eight) hours as needed.  Dispense: 30 tablet; Refill: 0  - Symptoms are still concerning for possible DDD or herniated/bulging disc with nerve root compression most likely in a C5-C6 distribution based off pain referrals - Will restart Gabapentin 155m at bedtime only (side effects) and start Ibuprofen 6053mTID for inflammation - Discussed he should follow up in person for further evaluation, particularly an Orthopedic urgent care would be best suited to start further evaluation - [facility] [place]; He can also call his Primary care office for further evaluation as well.  Follow Up Instructions: I discussed the assessment and treatment plan with the patient. The patient was provided an opportunity to ask questions and all were answered. The patient agreed with the plan and demonstrated an understanding of the instructions.  A copy of instructions were sent to the patient via MyChart unless otherwise noted below.    The patient  was advised to call back or seek an in-person evaluation if the symptoms worsen or if the condition fails to improve as anticipated.  Time:  I spent 15 minutes with the patient via telehealth technology discussing the above problems/concerns.    John Daring, PA-C

## 2022-02-04 NOTE — Patient Instructions (Signed)
  [name], thank you for joining Mar Daring, PA-C for today's virtual visit.  While this provider is not your primary care provider (PCP), if your PCP is located in our provider database this encounter information will be shared with them immediately following your visit.  Consent: (Patient) John [name] provided verbal consent for this virtual visit at the beginning of the encounter.  Current Medications:  Current Outpatient Medications:    Accu-Chek Softclix Lancets lancets, Use to test blood sugar 1-2 times daily. Dx:e11.9, Disp: 200 each, Rfl: 1   ALPRAZolam (XANAX) 0.5 MG tablet, Take 0.5-1 tablets (0.25-0.5 mg total) by mouth 2 (two) times daily as needed for anxiety. Continue following with psychiatrist. (Patient not taking: Reported on 07/29/2021), Disp: 60 tablet, Rfl: 1   amLODipine (NORVASC) 5 MG tablet, Take 1 tablet (5 mg total) by mouth at bedtime., Disp: 90 tablet, Rfl: 2   BD PEN NEEDLE NANO 2ND GEN 32G X 4 MM MISC, USE WITH INSULIN PEN, Disp: 100 each, Rfl: 2   Blood Glucose Monitoring Suppl (ACCU-CHEK GUIDE) w/Device KIT, Use to check blood sugars 1-2 times daily. Dx:E11.9, Disp: 1 kit, Rfl: 0   glucose blood (ACCU-CHEK GUIDE) test strip, Use to test blood sugar 1-2 times daily. Dx:e11.9, Disp: 200 each, Rfl: 1   insulin glargine (LANTUS SOLOSTAR) 100 UNIT/ML Solostar Pen, Inject 25 Units into the skin daily., Disp: 30 mL, Rfl: 1   lisinopril-hydrochlorothiazide (ZESTORETIC) 20-12.5 MG tablet, Take 2 tablets by mouth daily., Disp: 90 tablet, Rfl: 2   lovastatin (MEVACOR) 20 MG tablet, Take 1 tablet (20 mg total) by mouth at bedtime., Disp: 90 tablet, Rfl: 3   risperiDONE (RISPERDAL) 2 MG tablet, TAKE 1 TABLET(2 MG) BY MOUTH DAILY. CONTINUE FOLLOWING WITH PSYCHIATRIST (Patient not taking: Reported on 07/29/2021), Disp: 30 tablet, Rfl: 0   Semaglutide,0.25 or 0.5MG /DOS, (OZEMPIC, 0.25 OR 0.5 MG/DOSE,) 2 MG/1.5ML SOPN, Inject 0.5 mg into the skin once a week., Disp: 1.5  mL, Rfl: 0   sildenafil (REVATIO) 20 MG tablet, TAKE 2 TABLETS(40 MG) BY MOUTH DAILY AS NEEDED, Disp: 180 tablet, Rfl: 0   Medications ordered in this encounter:  No orders of the defined types were placed in this encounter.    *If you need refills on other medications prior to your next appointment, please contact your pharmacy*  Follow-Up: Call back or seek an in-person evaluation if the symptoms worsen or if the condition fails to improve as anticipated.  Other Instructions  [facility] [address], [place], [place] [address] URGENT CARE HOURS Monday - Friday: 8:00am to 8:00pm Saturday: 10:00am to 3:00pm [phone]   If you have been instructed to have an in-person evaluation today at a local Urgent Care facility, please use the link below. It will take you to a list of all of our available [facility], including address, phone number and hours of operation. Please do not delay care.  [facility]  If you or a family member do not have a primary care provider, use the link below to schedule a visit and establish care. When you choose a [facility] primary care physician or advanced practice provider, you gain a long-term partner in health. Find a Primary Care Provider  Learn more about 's in-office and virtual care options: [facility]

## 2022-02-09 ENCOUNTER — Other Ambulatory Visit: Payer: Self-pay | Admitting: Family Medicine

## 2022-02-10 ENCOUNTER — Other Ambulatory Visit (HOSPITAL_COMMUNITY): Payer: Self-pay

## 2022-02-10 MED ORDER — GLUCOSE BLOOD VI STRP
ORAL_STRIP | 0 refills | Status: DC
  Filled 2022-02-10: qty 100, 50d supply, fill #0

## 2022-02-10 MED ORDER — ACCU-CHEK SOFTCLIX LANCETS MISC
0 refills | Status: DC
  Filled 2022-02-10: qty 100, 50d supply, fill #0

## 2022-02-10 MED ORDER — ACCU-CHEK GUIDE W/DEVICE KIT
PACK | 0 refills | Status: DC
Start: 2022-02-10 — End: 2023-01-05
  Filled 2022-02-10: qty 1, 30d supply, fill #0

## 2022-02-11 ENCOUNTER — Other Ambulatory Visit: Payer: Self-pay | Admitting: Internal Medicine

## 2022-02-11 DIAGNOSIS — E118 Type 2 diabetes mellitus with unspecified complications: Secondary | ICD-10-CM

## 2022-02-11 DIAGNOSIS — M5030 Other cervical disc degeneration, unspecified cervical region: Secondary | ICD-10-CM | POA: Diagnosis not present

## 2022-02-11 DIAGNOSIS — M5412 Radiculopathy, cervical region: Secondary | ICD-10-CM | POA: Diagnosis not present

## 2022-02-12 ENCOUNTER — Other Ambulatory Visit: Payer: Self-pay | Admitting: Internal Medicine

## 2022-02-20 DIAGNOSIS — M542 Cervicalgia: Secondary | ICD-10-CM | POA: Diagnosis not present

## 2022-03-03 DIAGNOSIS — M542 Cervicalgia: Secondary | ICD-10-CM | POA: Diagnosis not present

## 2022-03-03 DIAGNOSIS — M5030 Other cervical disc degeneration, unspecified cervical region: Secondary | ICD-10-CM | POA: Diagnosis not present

## 2022-03-11 ENCOUNTER — Encounter: Payer: Self-pay | Admitting: *Deleted

## 2022-03-11 ENCOUNTER — Other Ambulatory Visit: Payer: Self-pay

## 2022-03-11 DIAGNOSIS — E118 Type 2 diabetes mellitus with unspecified complications: Secondary | ICD-10-CM

## 2022-03-11 MED ORDER — OZEMPIC (0.25 OR 0.5 MG/DOSE) 2 MG/3ML ~~LOC~~ SOPN: 0.5000 mg | PEN_INJECTOR | SUBCUTANEOUS | 0 refills | Status: DC

## 2022-03-11 MED ORDER — BD PEN NEEDLE NANO 2ND GEN 32G X 4 MM MISC
2 refills | Status: DC
Start: 2022-03-11 — End: 2022-12-04

## 2022-03-11 NOTE — Progress Notes (Signed)
[facility] Quality Team Note  Name: John [name] Date of Birth: 1965-09-02 MRN: [id number] Date: 03/11/2022  [facility] Quality Team has reviewed this patient's chart, please see recommendations below:  Colorectal Screening;  Diabetic Retinal Eye Exam;  [facility] Quality Other;  Pt has open gaps for colonoscopy, eye screening, and A1C.  Pt has upcoming appt on 03/18/2022.  Can you order these if pt agreeable to help close gaps?

## 2022-03-18 ENCOUNTER — Ambulatory Visit (INDEPENDENT_AMBULATORY_CARE_PROVIDER_SITE_OTHER): Payer: Medicare Other | Admitting: Internal Medicine

## 2022-03-18 DIAGNOSIS — E118 Type 2 diabetes mellitus with unspecified complications: Secondary | ICD-10-CM

## 2022-03-18 DIAGNOSIS — Z7985 Long-term (current) use of injectable non-insulin antidiabetic drugs: Secondary | ICD-10-CM

## 2022-03-18 MED ORDER — METFORMIN HCL ER 500 MG PO TB24: 500.0000 mg | ORAL_TABLET | Freq: Every day | ORAL | 0 refills | Status: DC

## 2022-03-18 MED ORDER — SEMAGLUTIDE (1 MG/DOSE) 4 MG/3ML ~~LOC~~ SOPN: 1.0000 mg | PEN_INJECTOR | SUBCUTANEOUS | 0 refills | Status: AC

## 2022-03-18 NOTE — Assessment & Plan Note (Signed)
Patient changed his appointment to tele-health at the last minute due to COVID exposure. A1c 10.3 in 11/2021. Patient's diabetes regimen is Insulin 25 units nightly and Ozempic 0.5 mg weekly. He states his fasting glucose is around 170s, highest was 280s and lowest glucose being in the 170s. Patient instructed to increase Ozempic to 1 mg weekly with eventual goal to increase to 2 mg weekly. We will also start Metformin and titrate up to 2 g daily. Plan is to see patient in person in 2 weeks for repeat A1c.  -Continue current insulin 25 units nightly, counseled on strict adherence. Increase Ozempic to 1 g weekly and Start Metformin gradually -Follow up in person appointment scheduled for 04/01/2022

## 2022-03-18 NOTE — Progress Notes (Signed)
  [facility] Internal Medicine Residency Telephone Encounter Continuity Care Appointment  HPI:  This telephone encounter was created for Mr. KATIE MOCH on 03/18/2022 for the following purpose/cc diabetes follow up.   Past Medical History:  Past Medical History:  Diagnosis Date   Anxiety disorder    Erectile dysfunction    Hyperlipidemia    Hypertension    Obesity, morbid, BMI 50 or higher (HCC)    Poorly controlled type 2 diabetes mellitus (HCC)    Schizophrenia (HCC)    Sleep apnea      ROS:  Negative unless stated in the HPI.   Assessment / Plan / Recommendations:  Please see A&P under problem oriented charting for assessment of the patient's acute and chronic medical conditions.  As always, pt is advised that if symptoms worsen or new symptoms arise, they should go to an urgent care facility or to to ER for further evaluation.   Consent and Medical Decision Making:  Patient discussed with Dr. Oswaldo Done This is a telephone encounter between [name] and [name] on 03/18/2022 for diabetes follow up. The visit was conducted with the patient located at home and [name] at [facility]. The patient's identity was confirmed using their DOB and current address. The patient has consented to being evaluated through a telephone encounter and understands the associated risks (an examination cannot be done and the patient may need to come in for an appointment) / benefits (allows the patient to remain at home, decreasing exposure to coronavirus). I personally spent 23 minutes on medical discussion.    Type 2 diabetes mellitus with unspecified complications [facility]) Patient changed his appointment to tele-health at the last minute due to COVID exposure. A1c 10.3 in 11/2021. Patient's diabetes regimen is Insulin 25 units nightly and Ozempic 0.5 mg weekly. He states his fasting glucose is around 170s, highest was 280s and lowest glucose being in the 170s. Patient instructed to increase Ozempic to 1  mg weekly with eventual goal to increase to 2 mg weekly. We will also start Metformin and titrate up to 2 g daily. Plan is to see patient in person in 2 weeks for repeat A1c.  -Continue current insulin 25 units nightly, counseled on strict adherence. Increase Ozempic to 1 g weekly and Start Metformin gradually -Follow up in person appointment scheduled for 04/01/2022

## 2022-03-20 NOTE — Progress Notes (Signed)
Internal Medicine Clinic Attending  Case discussed with Dr. Khan  At the time of the visit.  We reviewed the resident's history and exam and pertinent patient test results.  I agree with the assessment, diagnosis, and plan of care documented in the resident's note.  

## 2022-03-20 NOTE — Addendum Note (Signed)
Addended by: Erlinda Hong T on: 03/20/2022 08:28 AM   Modules accepted: Level of Service

## 2022-04-01 ENCOUNTER — Encounter: Payer: Self-pay | Admitting: Internal Medicine

## 2022-04-01 ENCOUNTER — Ambulatory Visit (INDEPENDENT_AMBULATORY_CARE_PROVIDER_SITE_OTHER): Payer: Medicare Other | Admitting: Internal Medicine

## 2022-04-01 VITALS — BP 146/91 | HR 118 | Temp 98.2°F | Resp 28 | Ht 71.0 in | Wt 379.5 lb

## 2022-04-01 DIAGNOSIS — G4733 Obstructive sleep apnea (adult) (pediatric): Secondary | ICD-10-CM | POA: Diagnosis not present

## 2022-04-01 DIAGNOSIS — Z0000 Encounter for general adult medical examination without abnormal findings: Secondary | ICD-10-CM

## 2022-04-01 DIAGNOSIS — Z794 Long term (current) use of insulin: Secondary | ICD-10-CM

## 2022-04-01 DIAGNOSIS — E785 Hyperlipidemia, unspecified: Secondary | ICD-10-CM

## 2022-04-01 DIAGNOSIS — Z23 Encounter for immunization: Secondary | ICD-10-CM

## 2022-04-01 DIAGNOSIS — I10 Essential (primary) hypertension: Secondary | ICD-10-CM

## 2022-04-01 DIAGNOSIS — F209 Schizophrenia, unspecified: Secondary | ICD-10-CM | POA: Diagnosis not present

## 2022-04-01 DIAGNOSIS — E1169 Type 2 diabetes mellitus with other specified complication: Secondary | ICD-10-CM | POA: Diagnosis not present

## 2022-04-01 DIAGNOSIS — E118 Type 2 diabetes mellitus with unspecified complications: Secondary | ICD-10-CM

## 2022-04-01 DIAGNOSIS — R000 Tachycardia, unspecified: Secondary | ICD-10-CM

## 2022-04-01 DIAGNOSIS — Z7984 Long term (current) use of oral hypoglycemic drugs: Secondary | ICD-10-CM | POA: Diagnosis not present

## 2022-04-01 LAB — POCT GLYCOSYLATED HEMOGLOBIN (HGB A1C): Hemoglobin A1C: 9.7 % — AB (ref 4.0–5.6)

## 2022-04-01 LAB — GLUCOSE, CAPILLARY: Glucose-Capillary: 216 mg/dL — ABNORMAL HIGH (ref 70–99)

## 2022-04-01 NOTE — Patient Instructions (Signed)
[name], it was a pleasure seeing you today! You endorsed feeling well today. Below are some of the things we talked about this visit. We look forward to seeing you in the follow up appointment!  Today we discussed: Continue current meds. Increase metformin by 500 mg until you are taking 1000 mg two times daily.  Get a blood pressure and bring it to the next visit. We will make changes to your medications at that time.  You got pneumonia, flu and eye exam referral today.   I have ordered the following labs today:   Lab Orders         Glucose, capillary         Lipid Profile         POC Hbg A1C       Referrals ordered today:    Referral Orders         Ambulatory referral to Ophthalmology      I have ordered the following medication/changed the following medications:   Stop the following medications: Medications Discontinued During This Encounter  Medication Reason   gabapentin (NEURONTIN) 100 MG capsule Patient has not taken in last 30 days     Start the following medications: No orders of the defined types were placed in this encounter.    Follow-up: 2 week follow up  Please make sure to arrive 15 minutes prior to your next appointment. If you arrive late, you may be asked to reschedule.   We look forward to seeing you next time. Please call our clinic at [phone] if you have any questions or concerns. The best time to call is Monday-Friday from 9am-4pm, but there is someone available 24/7. If after hours or the weekend, call the main hospital number and ask for the Internal Medicine Resident On-Call. If you need medication refills, please notify your pharmacy one week in advance and they will send [place] a request.  Thank you for letting us take part in your care. Wishing you the best!  Thank you, Idamae Schuller, MD

## 2022-04-01 NOTE — Assessment & Plan Note (Signed)
Pt on lovastatin 20 mg qd. Cholesterol 177 LDL 118. ASCVD risk at 22 %. Will repeat lipid panel and change to Crestor 20 mg as he needs high intensity.

## 2022-04-01 NOTE — Progress Notes (Signed)
CC: DMII follow up  HPI:  [name] is a 56 y.o. with medical history of HTN, HLD, DMII, Class III obesity, and schizophrenia presenting to [facility] for DMII follow up.  Please see problem-based list for further details, assessments, and plans.  Past Medical History:  Diagnosis Date   Anxiety disorder    Erectile dysfunction    Hyperlipidemia    Hypertension    Obesity, morbid, BMI 50 or higher ([place])    Poorly controlled type 2 diabetes mellitus (HCC)    Schizophrenia (HCC)    Sleep apnea     Current Outpatient Medications (Endocrine & Metabolic):    insulin glargine (LANTUS SOLOSTAR) 100 UNIT/ML Solostar Pen, Inject 25 Units into the skin daily.   metFORMIN (GLUCOPHAGE-XR) 500 MG 24 hr tablet, Take 1 tablet (500 mg total) by mouth daily with breakfast. For the first week: take 1 tablet once a day. For the second week: take 1 tablet in the AM and 1 tablet at the evening. For the third week: take 2 tablets (total of 1000 mg) in the AM and 1 tablet at the evening. For the forth week: take 2 tablets (total of 1000 mg) in the AM and 2 tablets (total of 1000 mg) at the evening.   Semaglutide, 1 MG/DOSE, 4 MG/3ML SOPN, Inject 1 mg into the skin once a week for 28 days.  Current Outpatient Medications (Cardiovascular):    amLODipine (NORVASC) 5 MG tablet, Take 1 tablet (5 mg total) by mouth at bedtime.   lisinopril-hydrochlorothiazide (ZESTORETIC) 20-12.5 MG tablet, Take 2 tablets by mouth daily.   rosuvastatin (CRESTOR) 20 MG tablet, Take 1 tablet (20 mg total) by mouth daily.   Current Outpatient Medications (Analgesics):    ibuprofen (ADVIL) 600 MG tablet, Take 1 tablet (600 mg total) by mouth every 8 (eight) hours as needed.   Current Outpatient Medications (Other):    Accu-Chek Softclix Lancets lancets, Use 1 to 2 times a day   BD PEN NEEDLE NANO 2ND GEN 32G X 4 MM MISC, Use needle with insulin pen to inject your insulin.   Blood Glucose Monitoring Suppl (ACCU-CHEK GUIDE)  w/Device KIT, Use to check blood sugars 1-2 times daily.   glucose blood test strip, Use to test 1 to 2 times a day  Review of Systems:  Review of system negative unless stated in the problem list or HPI.    Physical Exam:  Vitals:   04/01/22 1348 04/01/22 1354  BP: (!) 141/88 (!) 146/91  Pulse: (!) 118 (!) 118  Resp: (!) 28   Temp: 98.2 F (36.8 C)   TempSrc: Oral   SpO2: 97%   Weight: (!) 379 lb 8 oz (172.1 kg)   Height: _0  (1.803 m)     Physical Exam General: NAD HENT: NCAT Lungs: CTAB, no wheeze, rhonchi or rales.  Cardiovascular: Normal heart sounds, no r/m/g, 2+ pulses in all extremities. No LE edema Abdomen: No TTP, normal bowel sounds MSK: No asymmetry or muscle atrophy.  Skin: no lesions noted on exposed skin Neuro: Alert and oriented x4. CN grossly intact Psych: Normal mood and normal affect   Assessment & Plan:   Type 2 diabetes mellitus with unspecified complications (HCC) P7T improved 9.7 from 10.3. Insulin Solostar 25 units, Metformin 500 BID qd, Ozempic 1 mg weekly. Highest 252 after eating, lowest was fasting at 162 in the last 2 weeks. Will continue insulin at same rate and advise to increase Metformin to 1000 mg BID gradually by  500 mg weeklly. Will increase ozempic at next OV.  -2 week follow up   Hypertension Does not check bp at home. Current regimen is Lisinopril-HCTZ 20-12.5 mg qd and Norvasc 5 mg qd. BP in the clinic is 146/81 but pt has not taking his medications this morning. Cr normal in 11/2021. Pt advised to obtain blood pressure meter from online and bring it to next OV.  -Continue current meds and follow up in 2 weeks.   Hyperlipidemia associated with type 2 diabetes mellitus ([place]) Pt on lovastatin 20 mg qd. Cholesterol 177 LDL 118. ASCVD risk at 22 %. Will repeat lipid panel and change to Crestor 20 mg as he needs high intensity.   OSA (obstructive sleep apnea) Compliant with CPAP.   Healthcare maintenance Flu shot and eye referral  placed.   Schizophrenia ([place]) Pt currently not taking risperidone.States last took one year ago.  Will place a referral to psychiatry to restart this.   Tachycardia Pt heart rate elevated during the office visit. The rhythm was sinus on cardiac auscultation. HR was noted to be 118 and pt stated his BP was elevated due to long walk from from parking lot to the office. Repeat vitals still showed elevated HR but unsure if this was written down or copied forward as it was still 118. The time frame between the two measurement only 6 minutes. Pt denied any symptoms of dyspnea, chest pain, palpitations. He did endorse anxiety and inquired about klonopin which could be underlying etiology. Will monitor this at next OV and work up further if still present. Referral to psychiatry placed for anxiety and schizophrenia.    See Encounters Tab for problem based charting.  Patient discussed with Dr. Letha Cape, MD Tillie Rung. [facility] Internal Medicine Residency, PGY-2

## 2022-04-01 NOTE — Assessment & Plan Note (Signed)
Pt currently not taking risperidone.States last took one year ago.  Will place a referral to psychiatry to restart this.

## 2022-04-01 NOTE — Assessment & Plan Note (Signed)
Flu shot and eye referral placed.

## 2022-04-01 NOTE — Assessment & Plan Note (Addendum)
A1c improved 9.7 from 10.3. Insulin Solostar 25 units, Metformin 500 BID qd, Ozempic 1 mg weekly. Highest 252 after eating, lowest was fasting at 162 in the last 2 weeks. Will continue insulin at same rate and advise to increase Metformin to 1000 mg BID gradually by 500 mg weeklly. Will increase ozempic at next OV.  -2 week follow up

## 2022-04-01 NOTE — Assessment & Plan Note (Addendum)
Does not check bp at home. Current regimen is Lisinopril-HCTZ 20-12.5 mg qd and Norvasc 5 mg qd. BP in the clinic is 146/81 but pt has not taking his medications this morning. Cr normal in 11/2021. Pt advised to obtain blood pressure meter from online and bring it to next OV.  -Continue current meds and follow up in 2 weeks.

## 2022-04-01 NOTE — Assessment & Plan Note (Signed)
Compliant with CPAP 

## 2022-04-02 LAB — LIPID PANEL
Chol/HDL Ratio: 3.7 ratio (ref 0.0–5.0)
Cholesterol, Total: 157 mg/dL (ref 100–199)
HDL: 42 mg/dL (ref >39)
LDL Chol Calc (NIH): 102 mg/dL — ABNORMAL HIGH (ref 0–99)
Triglycerides: 67 mg/dL (ref 0–149)
VLDL Cholesterol Cal: 13 mg/dL (ref 5–40)

## 2022-04-02 MED ORDER — ROSUVASTATIN CALCIUM 20 MG PO TABS: 20.0000 mg | ORAL_TABLET | Freq: Every day | ORAL | 11 refills | Status: DC

## 2022-04-02 NOTE — Assessment & Plan Note (Addendum)
Pt heart rate elevated during the office visit. The rhythm was sinus on cardiac auscultation. HR was noted to be 118 and pt stated his BP was elevated due to long walk from from parking lot to the office. Repeat vitals still showed elevated HR but unsure if this was written down or copied forward as it was still 118. The time frame between the two measurement only 6 minutes. Pt denied any symptoms of dyspnea, chest pain, palpitations. He did endorse anxiety and inquired about klonopin which could be underlying etiology. Will monitor this at next OV and work up further if still present. Referral to psychiatry placed for anxiety and schizophrenia.

## 2022-04-08 NOTE — Progress Notes (Signed)
Internal Medicine Clinic Attending  Case discussed with Dr. Khan  At the time of the visit.  We reviewed the resident's history and exam and pertinent patient test results.  I agree with the assessment, diagnosis, and plan of care documented in the resident's note.  

## 2022-04-15 ENCOUNTER — Encounter: Payer: Self-pay | Admitting: Internal Medicine

## 2022-04-15 ENCOUNTER — Ambulatory Visit: Payer: Self-pay | Admitting: Dietician

## 2022-04-15 ENCOUNTER — Encounter: Payer: Medicare Other | Admitting: Family Medicine

## 2022-05-01 ENCOUNTER — Telehealth (INDEPENDENT_AMBULATORY_CARE_PROVIDER_SITE_OTHER): Payer: Medicare Other | Admitting: *Deleted

## 2022-05-01 DIAGNOSIS — E118 Type 2 diabetes mellitus with unspecified complications: Secondary | ICD-10-CM

## 2022-05-01 DIAGNOSIS — Z7985 Long-term (current) use of injectable non-insulin antidiabetic drugs: Secondary | ICD-10-CM

## 2022-05-01 NOTE — Telephone Encounter (Signed)
Needs refill of  Ozempic.  Wants refill to go to the [facility].

## 2022-05-02 ENCOUNTER — Other Ambulatory Visit: Payer: Self-pay | Admitting: Internal Medicine

## 2022-05-02 DIAGNOSIS — E118 Type 2 diabetes mellitus with unspecified complications: Secondary | ICD-10-CM

## 2022-05-04 DIAGNOSIS — G4733 Obstructive sleep apnea (adult) (pediatric): Secondary | ICD-10-CM | POA: Diagnosis not present

## 2022-05-05 MED ORDER — SEMAGLUTIDE(0.25 OR 0.5MG/DOS) 2 MG/3ML ~~LOC~~ SOPN: PEN_INJECTOR | SUBCUTANEOUS | 3 refills | Status: DC

## 2022-05-05 NOTE — Telephone Encounter (Signed)
Refill for Ozempic 0.5 mg sent to [facility] on [address].  Patient needs to follow-up in clinic for titration of medications.

## 2022-05-14 ENCOUNTER — Telehealth: Payer: Medicare Other | Admitting: Physician Assistant

## 2022-05-14 DIAGNOSIS — R350 Frequency of micturition: Secondary | ICD-10-CM

## 2022-05-14 NOTE — Progress Notes (Signed)
E-Visit for Urinary Problems  Based on what you shared with me, I feel your condition warrants further evaluation and I recommend that you be seen for a face to face office visit.  Male bladder infections are not very common.  We worry about prostate or kidney conditions, or other causes of urinary frequency. The standard of care is to examine the abdomen and kidneys, and to do a urine and blood test to make sure that something more serious is not going on.  We recommend that you see a provider today.  If your doctor's office is closed [facility] has the following Urgent Cares:    NOTE: You will not be charged for this e-visit.  If you are having a true medical emergency please call 911.       For an urgent face to face visit, [facility] has six urgent care centers for your convenience:     [facility] [place] [facility] [place] Get Driving Directions [phone] [place], [place] [address]    [facility] [place]) Get Driving Directions [phone] [place], [place] [address]  [facility] [place] ([place]) Get Driving Directions [phone] [address] [place],  [place]  [address]  [facility] Get Driving Directions [phone] [place] [address] [place], [place], [place] [address]   [facility] Get Driving Directions  [phone] [address], [place] [address]   [facility] [place] Get Driving Directions [phone] [address], [place], [place] [address]  Your MyChart E-visit questionnaire answers were reviewed by a board certified advanced clinical practitioner to complete your personal care plan based on your specific symptoms.  Thank you for using e-Visits.

## 2022-05-27 ENCOUNTER — Telehealth: Payer: Medicare Other | Admitting: Physician Assistant

## 2022-05-27 DIAGNOSIS — K219 Gastro-esophageal reflux disease without esophagitis: Secondary | ICD-10-CM | POA: Diagnosis not present

## 2022-05-27 MED ORDER — OMEPRAZOLE 20 MG PO CPDR: 20.0000 mg | DELAYED_RELEASE_CAPSULE | Freq: Every day | ORAL | 0 refills | Status: DC

## 2022-05-27 NOTE — Progress Notes (Signed)
E-Visit for Heartburn  We are sorry that you are not feeling well.  Here is how we plan to help!  Based on what you shared with me it looks like you most likely have Gastroesophageal Reflux Disease (GERD)  Gastroesophageal reflux disease (GERD) happens when acid from your stomach flows up into the esophagus.  When acid comes in contact with the esophagus, the acid causes sorenss (inflammation) in the esophagus.  Over time, GERD may create small holes (ulcers) in the lining of the esophagus.  I have prescribed Omeprazole 20 mg one by mouth daily until you follow up with a provider.  Your symptoms should improve in the next day or two.  You can use antacids as needed until symptoms resolve.  Call us if your heartburn worsens, you have trouble swallowing, weight loss, spitting up blood or recurrent vomiting.  Home Care: May include lifestyle changes such as weight loss, quitting smoking and alcohol consumption Avoid foods and drinks that make your symptoms worse, such as: Caffeine or alcoholic drinks Chocolate Peppermint or mint flavorings Garlic and onions Spicy foods Citrus fruits, such as oranges, lemons, or limes Tomato-based foods such as sauce, chili, salsa and pizza Fried and fatty foods Avoid lying down for 3 hours prior to your bedtime or prior to taking a nap Eat small, frequent meals instead of a large meals Wear loose-fitting clothing.  Do not wear anything tight around your waist that causes pressure on your stomach. Raise the head of your bed 6 to 8 inches with wood blocks to help you sleep.  Extra pillows will not help.  Seek Help Right Away If: You have pain in your arms, neck, jaw, teeth or back Your pain increases or changes in intensity or duration You develop nausea, vomiting or sweating (diaphoresis) You develop shortness of breath or you faint Your vomit is green, yellow, black or looks like coffee grounds or blood Your stool is red, bloody or black  These  symptoms could be signs of other problems, such as heart disease, gastric bleeding or esophageal bleeding.  Make sure you : Understand these instructions. Will watch your condition. Will get help right away if you are not doing well or get worse.  Your e-visit answers were reviewed by a board certified advanced clinical practitioner to complete your personal care plan.  Depending on the condition, your plan could have included both over the counter or prescription medications.  If there is a problem please reply  once you have received a response from your provider.  Your safety is important to us.  If you have drug allergies check your prescription carefully.    You can use MyChart to ask questions about today's visit, request a non-urgent call back, or ask for a work or school excuse for 24 hours related to this e-Visit. If it has been greater than 24 hours you will need to follow up with your provider, or enter a new e-Visit to address those concerns.  You will get an e-mail in the next two days asking about your experience.  I hope that your e-visit has been valuable and will speed your recovery. Thank you for using e-visits.  I have spent 5 minutes in review of e-visit questionnaire, review and updating patient chart, medical decision making and response to patient.   Deliah Strehlow M Kila Godina, PA-C  

## 2022-06-15 ENCOUNTER — Encounter: Payer: Medicare Other | Admitting: Student

## 2022-06-15 ENCOUNTER — Encounter: Payer: Medicare Other | Admitting: Dietician

## 2022-06-23 ENCOUNTER — Encounter: Payer: Medicare Other | Admitting: Student

## 2022-07-14 ENCOUNTER — Telehealth (HOSPITAL_BASED_OUTPATIENT_CLINIC_OR_DEPARTMENT_OTHER): Payer: Medicare Other | Admitting: Pulmonary Disease

## 2022-07-14 ENCOUNTER — Encounter (HOSPITAL_BASED_OUTPATIENT_CLINIC_OR_DEPARTMENT_OTHER): Payer: Self-pay

## 2022-07-15 NOTE — Progress Notes (Signed)
Visit was cancelled.

## 2022-08-03 DIAGNOSIS — G4733 Obstructive sleep apnea (adult) (pediatric): Secondary | ICD-10-CM | POA: Diagnosis not present

## 2022-08-14 ENCOUNTER — Other Ambulatory Visit: Payer: Self-pay | Admitting: Internal Medicine

## 2022-08-14 DIAGNOSIS — I10 Essential (primary) hypertension: Secondary | ICD-10-CM

## 2022-09-10 ENCOUNTER — Telehealth: Payer: 59 | Admitting: Family Medicine

## 2022-09-10 DIAGNOSIS — R6884 Jaw pain: Secondary | ICD-10-CM

## 2022-09-10 NOTE — Progress Notes (Signed)
John [name]   Needs in person assessment- left jaw (tooth pain) with radiation of pressure and numbness to shoulder and arm.  Patient acknowledged agreement and understanding of the plan.

## 2022-09-12 ENCOUNTER — Encounter (HOSPITAL_COMMUNITY): Payer: Self-pay

## 2022-09-12 ENCOUNTER — Ambulatory Visit (HOSPITAL_COMMUNITY)
Admission: EM | Admit: 2022-09-12 | Discharge: 2022-09-12 | Disposition: A | Discharge status: Home / Self Care | Payer: 59 | Attending: Emergency Medicine | Admitting: Emergency Medicine

## 2022-09-12 DIAGNOSIS — Z794 Long term (current) use of insulin: Secondary | ICD-10-CM

## 2022-09-12 DIAGNOSIS — E1165 Type 2 diabetes mellitus with hyperglycemia: Secondary | ICD-10-CM

## 2022-09-12 DIAGNOSIS — I10 Essential (primary) hypertension: Secondary | ICD-10-CM

## 2022-09-12 DIAGNOSIS — E1159 Type 2 diabetes mellitus with other circulatory complications: Secondary | ICD-10-CM | POA: Diagnosis not present

## 2022-09-12 DIAGNOSIS — E11638 Type 2 diabetes mellitus with other oral complications: Secondary | ICD-10-CM

## 2022-09-12 DIAGNOSIS — R000 Tachycardia, unspecified: Secondary | ICD-10-CM

## 2022-09-12 DIAGNOSIS — K0889 Other specified disorders of teeth and supporting structures: Secondary | ICD-10-CM | POA: Diagnosis not present

## 2022-09-12 DIAGNOSIS — R208 Other disturbances of skin sensation: Secondary | ICD-10-CM

## 2022-09-12 LAB — CBG MONITORING, ED: Glucose-Capillary: 338 mg/dL — ABNORMAL HIGH (ref 70–99)

## 2022-09-12 MED ORDER — NAPROXEN 500 MG PO TABS: 500.0000 mg | ORAL_TABLET | Freq: Two times a day (BID) | ORAL | 0 refills | Status: DC

## 2022-09-12 MED ORDER — KETOROLAC TROMETHAMINE 60 MG/2ML IM SOLN
INTRAMUSCULAR | Status: AC
  Filled 2022-09-12: qty 2

## 2022-09-12 MED ORDER — KETOROLAC TROMETHAMINE 60 MG/2ML IM SOLN
60.0000 mg | Freq: Once | INTRAMUSCULAR | Status: AC
  Administered 2022-09-12: 60 mg via INTRAMUSCULAR

## 2022-09-12 NOTE — ED Triage Notes (Signed)
Pt states that his tooth on the right bottom back is hurting. Causing nausea. X3 days. Took ibu and tylenol. Nothing today. Last taken last night. Also left forearm numbness.

## 2022-09-12 NOTE — Discharge Instructions (Addendum)
Please follow-up with the dentist for your dental pain.  I do not see any abscesses or obvious infection.  Please take the scheduled anti-inflammatories twice daily, you can take it with food if they upset your stomach.  Your blood sugar was elevated in the 300s today in clinic, please schedule an appointment with your doctor regarding this elevation.   Your EKG was without signs of a heart attack or heart damage and you do not appear to be having an acute cardiac event.  Please seek immediate care if you develop chest pain, shortness of breath, or worsening of numbness or tingling.  [facility] dental [facility] in [place], [place] Address: [address], [place], [place] [address] Phone: [phone]  [place] [phone] extension [phone] [address].  Dr. Donn Pierini [phone] [place] [phone] [address] [facility] [place].  Rescue mission [phone] extension [phone] [address] [place], [place], [address] First come first serve for the first 10 clients.  May do simple extractions only, no wisdom teeth or surgery.  You may try the second for Thursday of the month starting at [place] [facility] [employer] You may call the school to see if they are still helping to provide dental care for emergent cases.

## 2022-09-12 NOTE — ED Provider Notes (Signed)
[facility]    CSN: [id number] Arrival date & time: 09/12/22  1254      History   Chief Complaint Chief Complaint  Patient presents with   Dental Problem    HPI John [name] is a 57 y.o. male.   Reports left lower dental pain at his last molar since last night. Feels pressure and that it is throbbing, has been taking tylenol w/o relief. Has had wisdom teeth removal, does not have current dentist. Also reports decreased sensation to his left forearm since last night, not able to feel hot and cold as well.   Hx of HTN, HLD, anxiety, tachycardia. Reports he has not taken his BP medication today and being at the doctor makes him nervous.   Denies changes in vision, reports a slight headache. Has not checked CBG today.   Denies cough, SOB, or chest pain. Denies neck pain, recent trauma or falls.   The history is provided by the patient.    Past Medical History:  Diagnosis Date   Anxiety disorder    Erectile dysfunction    Hyperlipidemia    Hypertension    Obesity, morbid, BMI 50 or higher ([place])    Poorly controlled type 2 diabetes mellitus ([place])    Schizophrenia ([place])    Sleep apnea     Patient Active Problem List   Diagnosis Date Noted   Tachycardia 04/01/2022   Healthcare maintenance 12/03/2021   Erectile dysfunction 02/28/2020   Hyperlipidemia associated with type 2 diabetes mellitus ([place]) 10/18/2019   Type 2 diabetes mellitus with unspecified complications ([place]) [id number]   Schizophrenia ([place]) 07/25/2019   Anxiety disorder 07/25/2019   OSA (obstructive sleep apnea) 08/17/2013   Morbid obesity ([place]) 08/17/2013   Hypertension 08/17/2013    Past Surgical History:  Procedure Laterality Date   ANKLE SURGERY     APPENDECTOMY         Home Medications    Prior to Admission medications   Medication Sig Start Date End Date Taking? Authorizing Provider  Accu-Chek Softclix Lancets lancets Use 1 to 2 times a day 02/10/22  Yes Idamae Schuller, MD   amLODipine (NORVASC) 5 MG tablet Take 1 tablet (5 mg total) by mouth at bedtime. 12/03/21  Yes Wayland Denis, MD  BD PEN NEEDLE NANO 2ND GEN 32G X 4 MM MISC Use needle with insulin pen to inject your insulin. 03/11/22  Yes Idamae Schuller, MD  Blood Glucose Monitoring Suppl (ACCU-CHEK GUIDE) w/Device KIT Use to check blood sugars 1-2 times daily. 02/10/22  Yes Idamae Schuller, MD  glucose blood test strip Use to test 1 to 2 times a day 02/10/22  Yes Idamae Schuller, MD  ibuprofen (ADVIL) 600 MG tablet Take 1 tablet (600 mg total) by mouth every 8 (eight) hours as needed. 02/04/22  Yes Fenton Malling M, PA-C  insulin glargine (LANTUS SOLOSTAR) 100 UNIT/ML Solostar Pen Inject 25 Units into the skin daily. 12/03/21  Yes Wayland Denis, MD  lisinopril-hydrochlorothiazide (ZESTORETIC) 20-12.5 MG tablet Take 2 tablets by mouth once daily 08/17/22  Yes Idamae Schuller, MD  naproxen (NAPROSYN) 500 MG tablet Take 1 tablet (500 mg total) by mouth 2 (two) times daily. 09/12/22  Yes Louretta Shorten, [place] N, FNP  omeprazole (PRILOSEC) 20 MG capsule Take 1 capsule (20 mg total) by mouth daily. 05/27/22  Yes Mar Daring, PA-C  rosuvastatin (CRESTOR) 20 MG tablet Take 1 tablet (20 mg total) by mouth daily. 04/02/22 04/02/23 Yes Idamae Schuller, MD  Semaglutide,0.25 or 0.'5MG'$ /DOS, 2 MG/3ML  SOPN Inject 0.5 mg weekly. 05/05/22  Yes Masters, Katie, DO  metFORMIN (GLUCOPHAGE-XR) 500 MG 24 hr tablet Take 1 tablet (500 mg total) by mouth daily with breakfast. For the first week: take 1 tablet once a day. For the second week: take 1 tablet in the AM and 1 tablet at the evening. For the third week: take 2 tablets (total of 1000 mg) in the AM and 1 tablet at the evening. For the forth week: take 2 tablets (total of 1000 mg) in the AM and 2 tablets (total of 1000 mg) at the evening. 03/18/22 04/17/22  Idamae Schuller, MD    Family History Family History  Problem Relation Age of Onset   Breast cancer Mother    Alcohol abuse Father    Hypertension  Sister    Breast cancer Sister    Hypertension Brother    Diabetes type II Brother     Social History Social History   Tobacco Use   Smoking status: Never   Smokeless tobacco: Never  Vaping Use   Vaping Use: Never used  Substance Use Topics   Alcohol use: Yes    Comment: rare in the last two years, 1x in 2-3 months   Drug use: Yes    Types: Marijuana    Comment: Occasional     Allergies   Patient has no active allergies.   Review of Systems Review of Systems  Constitutional:  Negative for chills and fever.  HENT:  Positive for dental problem. Negative for ear pain, sore throat and trouble swallowing.   Eyes:  Negative for pain and visual disturbance.  Respiratory:  Negative for cough and shortness of breath.   Cardiovascular:  Negative for chest pain and palpitations.  Gastrointestinal:  Negative for abdominal pain and vomiting.  Genitourinary:  Negative for dysuria and hematuria.  Musculoskeletal:  Negative for arthralgias and back pain.  Skin:  Negative for color change and rash.  Neurological:  Positive for numbness and headaches. Negative for seizures, syncope and weakness.  All other systems reviewed and are negative.    Physical Exam Triage Vital Signs ED Triage Vitals  Enc Vitals Group     BP 09/12/22 1345 (!) 152/101     Pulse Rate 09/12/22 1345 (!) 118     Resp 09/12/22 1345 14     Temp 09/12/22 1345 98.3 F (36.8 C)     Temp Source 09/12/22 1345 Oral     SpO2 09/12/22 1345 93 %     Weight 09/12/22 1344 (!) 360 lb (163.3 kg)     Height --      Head Circumference --      Peak Flow --      Pain Score 09/12/22 1343 9     Pain Loc --      Pain Edu? --      Excl. in [place]? --    No data found.  Updated Vital Signs BP (!) 152/101 (BP Location: Left Arm)   Pulse (!) 118   Temp 98.3 F (36.8 C) (Oral)   Resp 14   Wt (!) 360 lb (163.3 kg)   SpO2 93%   BMI 50.21 kg/m   Visual Acuity Right Eye Distance:   Left Eye Distance:   Bilateral  Distance:    Right Eye Near:   Left Eye Near:    Bilateral Near:     Physical Exam Vitals and nursing note reviewed.  Constitutional:      General: He is not in  acute distress.    Appearance: Normal appearance. He is well-developed.  HENT:     Head: Normocephalic and atraumatic.     Left Ear: External ear normal.     Mouth/Throat:     Mouth: Mucous membranes are moist.     Pharynx: No posterior oropharyngeal erythema.  Eyes:     General: Lids are normal. No visual field deficit or scleral icterus.       Right eye: No discharge.        Left eye: No discharge.     Extraocular Movements: Extraocular movements intact.     Conjunctiva/sclera: Conjunctivae normal.     Pupils: Pupils are equal, round, and reactive to light.  Cardiovascular:     Rate and Rhythm: Regular rhythm. Tachycardia present.     Pulses: Normal pulses.     Heart sounds: Normal heart sounds, S1 normal and S2 normal. No murmur heard. Pulmonary:     Effort: Pulmonary effort is normal. No respiratory distress.     Breath sounds: Normal breath sounds.     Comments: Lungs vesicular posteriorly. Abdominal:     Palpations: Abdomen is soft.     Tenderness: There is no abdominal tenderness.  Musculoskeletal:        General: No swelling, tenderness, deformity or signs of injury. Normal range of motion.     Cervical back: Normal range of motion and neck supple.  Lymphadenopathy:     Cervical: No cervical adenopathy.  Skin:    General: Skin is warm and dry.     Capillary Refill: Capillary refill takes less than 2 seconds.  Neurological:     Mental Status: He is alert and oriented to person, place, and time.     GCS: GCS eye subscore is 4. GCS verbal subscore is 5. GCS motor subscore is 6.     Cranial Nerves: Cranial nerves 2-12 are intact. No cranial nerve deficit, dysarthria or facial asymmetry.     Sensory: Sensory deficit present.     Comments: Sensation decreased in left forearm.   Psychiatric:        Mood and  Affect: Mood normal.        Behavior: Behavior is cooperative.      UC Treatments / Results  Labs (all labs ordered are listed, but only abnormal results are displayed) Labs Reviewed  CBG MONITORING, ED - Abnormal; Notable for the following components:      Result Value   Glucose-Capillary 338 (*)    All other components within normal limits    EKG   Radiology No results found.  Procedures Procedures (including critical care time)  Medications Ordered in UC Medications  ketorolac (TORADOL) injection 60 mg (60 mg Intramuscular Given 09/12/22 1443)    Initial Impression / Assessment and Plan / UC Course  I have reviewed the triage vital signs and the nursing notes.  Pertinent labs & imaging results that were available during my care of the patient were reviewed by me and considered in my medical decision making (see chart for details).  Vitals and triage reviewed, patient appears hemodynamically stable.  Is hypertensive, has not yet taken his antihypertensive today.  Is tachycardic at 118 with history of same.  CBG in clinic 338, comparable to previous values, although elevated.   EKG obtained due to jaw pain and decreased sensation in left arm. W/o CP or SOB.  EKG my interpretation with a sinus tachycardia 125, nonspecific T wave abnormality present on previous EKGs. No STE or STD.  Do not feel as if decreased sensation to left forearm is of cardiac etiology. Negative Tinnel and Phalen's sign. Suspect relation to hyperglycemia.  Scheduled anti-inflammatories for dental pain and given local dental resources.  Patient verbalized understanding with treatment plan, emergency precautions discussed, no questions at this time.     Final Clinical Impressions(s) / UC Diagnoses   Final diagnoses:  Pain, dental  Decreased sensation of hand and upper extremity  Type 2 diabetes mellitus with hyperglycemia, with long-term current use of insulin (HCC)  Sinus tachycardia     Discharge  Instructions      Please follow-up with the dentist for your dental pain.  I do not see any abscesses or obvious infection.  Please take the scheduled anti-inflammatories twice daily, you can take it with food if they upset your stomach.  Your blood sugar was elevated in the 300s today in clinic, please schedule an appointment with your doctor regarding this elevation.   Your EKG was without signs of a heart attack or heart damage and you do not appear to be having an acute cardiac event.  Please seek immediate care if you develop chest pain, shortness of breath, or worsening of numbness or tingling.  [facility] in [place], [place] Address: [address], [place], [place] [address] Phone: [phone]  [place] [phone] extension [phone] [address].  Dr. Donn Pierini [phone] [place] [phone] [address] [facility].  [facility]cue mission [phone] extension [phone] [address]. [address] [place], [place], [address] First come first serve for the first 10 clients.  May do simple extractions only, no wisdom teeth or surgery.  You may try the second for Thursday of the month starting at [employer] [facility] [employer] You may call the school to see if they are still helping to provide dental care for emergent cases.      ED Prescriptions     Medication Sig Dispense Auth. Provider   naproxen (NAPROSYN) 500 MG tablet Take 1 tablet (500 mg total) by mouth 2 (two) times daily. 30 tablet Kolette Vey, [place] N, [place]      I have reviewed the PDMP during this encounter.   Adriana Quinby, [place] N, [place] 09/12/22 1501

## 2022-09-23 ENCOUNTER — Telehealth: Payer: 59 | Admitting: Family

## 2022-09-23 DIAGNOSIS — J069 Acute upper respiratory infection, unspecified: Secondary | ICD-10-CM | POA: Diagnosis not present

## 2022-09-23 MED ORDER — CETIRIZINE HCL 10 MG PO TABS: 10.0000 mg | ORAL_TABLET | Freq: Every day | ORAL | 1 refills | Status: DC

## 2022-09-23 MED ORDER — FLUTICASONE PROPIONATE 50 MCG/ACT NA SUSP: 2.0000 | Freq: Every day | NASAL | 6 refills | Status: DC

## 2022-09-23 MED ORDER — BENZONATATE 100 MG PO CAPS: 100.0000 mg | ORAL_CAPSULE | Freq: Three times a day (TID) | ORAL | 0 refills | Status: DC | PRN

## 2022-09-23 NOTE — Patient Instructions (Signed)

## 2022-09-23 NOTE — Progress Notes (Signed)
Virtual Visit Consent   [name], you are scheduled for a virtual visit with a [facility] provider today. Just as with appointments in the office, your consent must be obtained to participate. Your consent will be active for this visit and any virtual visit you may have with one of our providers in the next 365 days. If you have a MyChart account, a copy of this consent can be sent to you electronically.  As this is a virtual visit, video technology does not allow for your provider to perform a traditional examination. This may limit your provider's ability to fully assess your condition. If your provider identifies any concerns that need to be evaluated in person or the need to arrange testing (such as labs, EKG, etc.), we will make arrangements to do so. Although advances in technology are sophisticated, we cannot ensure that it will always work on either your end or our end. If the connection with a video visit is poor, the visit may have to be switched to a telephone visit. With either a video or telephone visit, we are not always able to ensure that we have a secure connection.  By engaging in this virtual visit, you consent to the provision of healthcare and authorize for your insurance to be billed (if applicable) for the services provided during this visit. Depending on your insurance coverage, you may receive a charge related to this service.  I need to obtain your verbal consent now. Are you willing to proceed with your visit today? BRIAR STPIERRE has provided verbal consent on 09/23/2022 for a virtual visit (video or telephone). John Dun, FNP  Date: 09/23/2022 2:20 PM  Virtual Visit via Video Note   I, John [name], connected with  John [name]  ([id number], 14-Mar-1966) on 09/23/22 at  2:15 PM EDT by a video-enabled telemedicine application and verified that I am speaking with the correct person using two identifiers.  Location: Patient: Virtual Visit Location Patient:  Home Provider: Virtual Visit Location Provider: Home Office   I discussed the limitations of evaluation and management by telemedicine and the availability of in person appointments. The patient expressed understanding and agreed to proceed.    History of Present Illness: John [name] is a 57 y.o. who identifies as a male who was assigned male at birth, and is being seen today for ear congestion and mucus that started two days ago.  HPI: URI  This is a new problem. The current episode started in the past 7 days. The problem has been unchanged. There has been no fever. Associated symptoms include congestion, coughing, ear pain, headaches (improved), sinus pain, sneezing and a sore throat. Pertinent negatives include no nausea. He has tried acetaminophen and decongestant for the symptoms. The treatment provided mild relief.    Problems:  Patient Active Problem List   Diagnosis Date Noted   Tachycardia 04/01/2022   Healthcare maintenance 12/03/2021   Erectile dysfunction 02/28/2020   Hyperlipidemia associated with type 2 diabetes mellitus ([place]) 10/18/2019   Type 2 diabetes mellitus with unspecified complications ([place]) Q000111Q   Schizophrenia ([place]) 07/25/2019   Anxiety disorder 07/25/2019   OSA (obstructive sleep apnea) 08/17/2013   Morbid obesity ([place]) 08/17/2013   Hypertension 08/17/2013    Allergies: No Active Allergies Medications:  Current Outpatient Medications:    benzonatate (TESSALON PERLES) 100 MG capsule, Take 1 capsule (100 mg total) by mouth 3 (three) times daily as needed., Disp: 60 capsule, Rfl: 0   cetirizine (ZYRTEC ALLERGY)  10 MG tablet, Take 1 tablet (10 mg total) by mouth daily., Disp: 90 tablet, Rfl: 1   fluticasone (FLONASE) 50 MCG/ACT nasal spray, Place 2 sprays into both nostrils daily., Disp: 16 g, Rfl: 6   Accu-Chek Softclix Lancets lancets, Use 1 to 2 times a day, Disp: 100 each, Rfl: 0   amLODipine (NORVASC) 5 MG tablet, Take 1 tablet (5 mg total) by mouth  at bedtime., Disp: 90 tablet, Rfl: 2   BD PEN NEEDLE NANO 2ND GEN 32G X 4 MM MISC, Use needle with insulin pen to inject your insulin., Disp: 100 each, Rfl: 2   Blood Glucose Monitoring Suppl (ACCU-CHEK GUIDE) w/Device KIT, Use to check blood sugars 1-2 times daily., Disp: 1 kit, Rfl: 0   glucose blood test strip, Use to test 1 to 2 times a day, Disp: 100 each, Rfl: 0   ibuprofen (ADVIL) 600 MG tablet, Take 1 tablet (600 mg total) by mouth every 8 (eight) hours as needed., Disp: 30 tablet, Rfl: 0   insulin glargine (LANTUS SOLOSTAR) 100 UNIT/ML Solostar Pen, Inject 25 Units into the skin daily., Disp: 30 mL, Rfl: 1   lisinopril-hydrochlorothiazide (ZESTORETIC) 20-12.5 MG tablet, Take 2 tablets by mouth once daily, Disp: 90 tablet, Rfl: 0   metFORMIN (GLUCOPHAGE-XR) 500 MG 24 hr tablet, Take 1 tablet (500 mg total) by mouth daily with breakfast. For the first week: take 1 tablet once a day. For the second week: take 1 tablet in the AM and 1 tablet at the evening. For the third week: take 2 tablets (total of 1000 mg) in the AM and 1 tablet at the evening. For the forth week: take 2 tablets (total of 1000 mg) in the AM and 2 tablets (total of 1000 mg) at the evening., Disp: 70 tablet, Rfl: 0   naproxen (NAPROSYN) 500 MG tablet, Take 1 tablet (500 mg total) by mouth 2 (two) times daily., Disp: 30 tablet, Rfl: 0   omeprazole (PRILOSEC) 20 MG capsule, Take 1 capsule (20 mg total) by mouth daily., Disp: 30 capsule, Rfl: 0   rosuvastatin (CRESTOR) 20 MG tablet, Take 1 tablet (20 mg total) by mouth daily., Disp: 30 tablet, Rfl: 11   Semaglutide,0.25 or 0.'5MG'$ /DOS, 2 MG/3ML SOPN, Inject 0.5 mg weekly., Disp: 9 mL, Rfl: 3  Observations/Objective: Patient is well-developed, well-nourished in no acute distress.  Resting comfortably  at home.  Head is normocephalic, atraumatic.  No labored breathing.  Speech is clear and coherent with logical content.  Patient is alert and oriented at baseline.  Nasal  congestion    Assessment and Plan: 1. Viral URI - fluticasone (FLONASE) 50 MCG/ACT nasal spray; Place 2 sprays into both nostrils daily.  Dispense: 16 g; Refill: 6 - cetirizine (ZYRTEC ALLERGY) 10 MG tablet; Take 1 tablet (10 mg total) by mouth daily.  Dispense: 90 tablet; Refill: 1 - benzonatate (TESSALON PERLES) 100 MG capsule; Take 1 capsule (100 mg total) by mouth 3 (three) times daily as needed.  Dispense: 60 capsule; Refill: 0  - Take meds as prescribed - Use a cool mist humidifier  -Use saline nose sprays frequently -Force fluids -For any cough or congestion  Use plain Mucinex- regular strength or max strength is fine -For fever or aces or pains- take tylenol or ibuprofen. -Throat lozenges if help -Follow up if symptoms worsen or do not improve   Follow Up Instructions: I discussed the assessment and treatment plan with the patient. The patient was provided an opportunity to ask questions  and all were answered. The patient agreed with the plan and demonstrated an understanding of the instructions.  A copy of instructions were sent to the patient via MyChart unless otherwise noted below.    The patient was advised to call back or seek an in-person evaluation if the symptoms worsen or if the condition fails to improve as anticipated.  Time:  I spent 9 minutes with the patient via telehealth technology discussing the above problems/concerns.    John Dun, FNP

## 2022-09-24 ENCOUNTER — Encounter: Payer: Self-pay | Admitting: Internal Medicine

## 2022-09-25 NOTE — Progress Notes (Signed)
CC: Urgent Care Follow-up  HPI:   [name] is a 57 y.o. male with a past medical history of obesity, hypertension, hyperlipidemia, diabetes, anxiety, and schizophrenia who presents for UC follow-up. He visited [facility] UC on 3-2 for jaw pain, cardiac workup was negative, and discharged with course of naproxen for symptomatic relief. Last seen at [facility] 03-2022.   Past Medical History:  Diagnosis Date   Anxiety disorder    Erectile dysfunction    Hyperlipidemia    Hypertension    Obesity, morbid, BMI 50 or higher ([place])    Poorly controlled type 2 diabetes mellitus (HCC)    Schizophrenia (HCC)    Sleep apnea      Review of Systems:    Reports hand numbness Denies abdominal pain, bloating, nausea, diarrhea, constipation   Physical Exam:  Vitals:   09/29/22 1503 09/29/22 1525  BP: (!) 150/85 (!) 140/91  Pulse: (!) 115 (!) 115  Temp: 97.7 F (36.5 C)   TempSrc: Oral   SpO2: 96%   Weight: (!) 372 lb 4.8 oz (168.9 kg)   Height: 6' (1.829 m)     General:   awake and alert, sitting comfortably in chair, cooperative, not in acute distress Skin:   warm and dry, intact without any obvious lesions or scars, no rashes Ears:   tympanic membranes nonbulging and nonerythematous, copious wax in canal Nose:   symmetrical and without mucosal inflammation, no external lesions or discharge Lungs:   normal respiratory effort, breathing unlabored, symmetrical chest rise, no crackles or wheezing Cardiac:   tachycardic rate and regular rhythm, normal S1 and S2 Musculoskeletal:   strength intact and symmetrical across muscles innervated by median-ulner-radial nerves Neurologic:   oriented to person-place-time, moving all extremities, decreased sensation along C8 distribution through entire LUE including shoulder Psychiatric:   euthymic mood with congruent affect, intelligible speech    Assessment & Plan:   Viral upper respiratory infection Patient presented to UC on 3-2 for tooth and jaw  pain that he describes as pressure. Cardiac workup was negative, he was discharged with naproxen. Next was evaluated by family medicine and diagnosed with viral upper respiratory infection. Prescribed fluticasone, cetirizine, and benzonatate. Although his symptoms persist, they have improved. Denies fever, chills, breath shortness, myalgias, fatigue, cough, and hearing loss. Lungs were clear to auscultation bilaterally and tympanic membranes nonbulging without erythema. Agree with previous assessment of uncomplicated upper respiratory viral infection. Further recommended using hot water from shower to humidify room for symptom relief.  - Continue symptom management with fluticasone, cetirizine, and benzonatate    Cervical radiculopathy at C8 Patient reports decreased sensation, specifically to temperature, in his LUE. Denies pain, weakness, discoloration, and trauma. Exam revealed decreased sensation along C8 distribution through entire LUE including shoulder. Strength intact and symmetrical across muscles innervated by median-ulner-radial nerves. Findings and history are consistent with cervical radiculopathy. Low concern for peripheral nerve entrapment or compression. In the past, patient had been on gabapentin and this improved his upper extremity symptoms.   - Start gabapentin 100mg  q8, can increase as needed    Type 2 diabetes mellitus with unspecified complications [facility]) Patient has history of diabetes managed with glargine 25U daily and semaglutide, which was prescribed at prior visit in 03-2022. Also prescribed metformin, but has self-discontinued due to gastrointestinal side effects. Most recent A1C collected 03-2022 was 9.7. Discussed importance of achieving better glycemic control. Tolerating semaglutide well without any abdominal pain, bloating, nausea, diarrhea, or constipation. Depending on A1C results, may need  to start SGlT-2 inhibitor at next visit. Of note, patient declines uAlb:Cr  until next visit due describing an inability to produce sample in timely manner.  - Discontinue metformin - Continue insulin glargine 25U q24 - Increase semaglutide from 0.5mg  to 1mg  q168 - Check A1C - Check uAlb:Cr at next visit - Consider SGlT-2 inhibitor at next [facility] visit    Hypertension Patient has history of hypertension, currently prescribed lisinopril-hydrochlorothiazide and amlodipine. Self-discontinued lisinopril-hydrochlorothiazide due to feeling lightheaded for 1-2hr each time that he takes it. His blood pressure readings are never low per his account. Orthostatic vital signs performed in clinic were negative. Today in clinic, BP was 140/91. Tolerates amlodipine well without any side effects. Denies experiencing blurry vision, chest pain, and palpitations. Discussed importance of taking blood pressure medications regularly, will replace lisinopril-hydrochlorothiazide with losartan for now. Instructed to keep BP log and bring to next visit, may need to increase losartan dose and try reintroducing hydrochlorothiazide in the future.  - Discontinue lisinopril-hydrochlorothiazide  - Continue amlodipine 10mg  q24 - Maintain BP log and bring to next [facility] visit - Consider increasing losartan dose and adding hydrochlorothiazide if BP elevated    Hyperlipidemia associated with type 2 diabetes mellitus ([place]) Patient has history of hyperlipidemia, most recent lipid panel collected 03-2022 demonstrated LDL 102 and TChol 157. Prescribed rosuvastatin, but has been forgetting to take it. Son invested in a pill box to help with adherence. Counseling provided on importance of taking rosuvastatin daily to lower cholesterol.   - Rosuvastatin 20mg  q24 - Check lipid panel around 01-2023     Anxiety disorder Patient has history of anxiety, which may explain his tachycardia and high BP readings in clinical setting. Home BP readings have been consistently lower. Reports feeling anxious specifically in  social situations. Further investigation into his anxiety at next [facility] visit is warranted, especially given potential physiological consequences.  - Complete GAD-7 at next [facility] visit - Consider starting SNRI      See Encounters Tab for problem based charting.  Patient discussed with Dr.  Cain Sieve

## 2022-09-29 ENCOUNTER — Ambulatory Visit (INDEPENDENT_AMBULATORY_CARE_PROVIDER_SITE_OTHER): Payer: 59 | Admitting: Student

## 2022-09-29 ENCOUNTER — Encounter: Payer: Self-pay | Admitting: Student

## 2022-09-29 ENCOUNTER — Ambulatory Visit (INDEPENDENT_AMBULATORY_CARE_PROVIDER_SITE_OTHER): Payer: 59

## 2022-09-29 VITALS — BP 140/91 | HR 115 | Temp 97.7°F | Ht 72.0 in | Wt 372.3 lb

## 2022-09-29 DIAGNOSIS — F419 Anxiety disorder, unspecified: Secondary | ICD-10-CM

## 2022-09-29 DIAGNOSIS — E785 Hyperlipidemia, unspecified: Secondary | ICD-10-CM | POA: Diagnosis not present

## 2022-09-29 DIAGNOSIS — Z7984 Long term (current) use of oral hypoglycemic drugs: Secondary | ICD-10-CM

## 2022-09-29 DIAGNOSIS — M5412 Radiculopathy, cervical region: Secondary | ICD-10-CM | POA: Diagnosis not present

## 2022-09-29 DIAGNOSIS — J069 Acute upper respiratory infection, unspecified: Secondary | ICD-10-CM | POA: Diagnosis not present

## 2022-09-29 DIAGNOSIS — E1169 Type 2 diabetes mellitus with other specified complication: Secondary | ICD-10-CM

## 2022-09-29 DIAGNOSIS — I10 Essential (primary) hypertension: Secondary | ICD-10-CM

## 2022-09-29 DIAGNOSIS — E118 Type 2 diabetes mellitus with unspecified complications: Secondary | ICD-10-CM

## 2022-09-29 DIAGNOSIS — Z794 Long term (current) use of insulin: Secondary | ICD-10-CM | POA: Diagnosis not present

## 2022-09-29 DIAGNOSIS — Z0000 Encounter for general adult medical examination without abnormal findings: Secondary | ICD-10-CM | POA: Diagnosis not present

## 2022-09-29 LAB — GLUCOSE, CAPILLARY: Glucose-Capillary: 302 mg/dL — ABNORMAL HIGH (ref 70–99)

## 2022-09-29 LAB — POCT GLYCOSYLATED HEMOGLOBIN (HGB A1C): HbA1c POC (<> result, manual entry): >14 % — AB (ref 4.0–5.6)

## 2022-09-29 MED ORDER — SEMAGLUTIDE (1 MG/DOSE) 4 MG/3ML ~~LOC~~ SOPN: 1.0000 mg | PEN_INJECTOR | SUBCUTANEOUS | 0 refills | Status: DC

## 2022-09-29 MED ORDER — GABAPENTIN 100 MG PO CAPS: 100.0000 mg | ORAL_CAPSULE | Freq: Three times a day (TID) | ORAL | 2 refills | Status: DC

## 2022-09-29 MED ORDER — LOSARTAN POTASSIUM 25 MG PO TABS: 25.0000 mg | ORAL_TABLET | Freq: Every day | ORAL | 2 refills | Status: DC

## 2022-09-29 NOTE — Assessment & Plan Note (Signed)
Patient has history of hypertension, currently prescribed lisinopril-hydrochlorothiazide and amlodipine. Self-discontinued lisinopril-hydrochlorothiazide due to feeling lightheaded for 1-2hr each time that he takes it. His blood pressure readings are never low per his account. Orthostatic vital signs performed in clinic were negative. Today in clinic, BP was 140/91. Tolerates amlodipine well without any side effects. Denies experiencing blurry vision, chest pain, and palpitations. Discussed importance of taking blood pressure medications regularly, will replace lisinopril-hydrochlorothiazide with losartan for now. Instructed to keep BP log and bring to next visit, may need to increase losartan dose and try reintroducing hydrochlorothiazide in the future.  - Discontinue lisinopril-hydrochlorothiazide  - Continue amlodipine 10mg  q24 - Maintain BP log and bring to next [facility] visit - Consider increasing losartan dose and adding hydrochlorothiazide if BP elevated

## 2022-09-29 NOTE — Patient Instructions (Signed)
Health Maintenance, Male Adopting a healthy lifestyle and getting preventive care are important in promoting health and wellness. Ask your health care provider about: The right schedule for you to have regular tests and exams. Things you can do on your own to prevent diseases and keep yourself healthy. What should I know about diet, weight, and exercise? Eat a healthy diet  Eat a diet that includes plenty of vegetables, fruits, low-fat dairy products, and lean protein. Do not eat a lot of foods that are high in solid fats, added sugars, or sodium. Maintain a healthy weight Body mass index (BMI) is a measurement that can be used to identify possible weight problems. It estimates body fat based on height and weight. Your health care provider can help determine your BMI and help you achieve or maintain a healthy weight. Get regular exercise Get regular exercise. This is one of the most important things you can do for your health. Most adults should: Exercise for at least 150 minutes each week. The exercise should increase your heart rate and make you sweat (moderate-intensity exercise). Do strengthening exercises at least twice a week. This is in addition to the moderate-intensity exercise. Spend less time sitting. Even light physical activity can be beneficial. Watch cholesterol and blood lipids Have your blood tested for lipids and cholesterol at 56 years of age, then have this test every 5 years. You may need to have your cholesterol levels checked more often if: Your lipid or cholesterol levels are high. You are older than 56 years of age. You are at high risk for heart disease. What should I know about cancer screening? Many types of cancers can be detected early and may often be prevented. Depending on your health history and family history, you may need to have cancer screening at various ages. This may include screening for: Colorectal cancer. Prostate cancer. Skin cancer. Lung  cancer. What should I know about heart disease, diabetes, and high blood pressure? Blood pressure and heart disease High blood pressure causes heart disease and increases the risk of stroke. This is more likely to develop in people who have high blood pressure readings or are overweight. Talk with your health care provider about your target blood pressure readings. Have your blood pressure checked: Every 3-5 years if you are 18-39 years of age. Every year if you are 40 years old or older. If you are between the ages of 65 and 75 and are a current or former smoker, ask your health care provider if you should have a one-time screening for abdominal aortic aneurysm (AAA). Diabetes Have regular diabetes screenings. This checks your fasting blood sugar level. Have the screening done: Once every three years after age 45 if you are at a normal weight and have a low risk for diabetes. More often and at a younger age if you are overweight or have a high risk for diabetes. What should I know about preventing infection? Hepatitis B If you have a higher risk for hepatitis B, you should be screened for this virus. Talk with your health care provider to find out if you are at risk for hepatitis B infection. Hepatitis C Blood testing is recommended for: Everyone born from 1945 through 1965. Anyone with known risk factors for hepatitis C. Sexually transmitted infections (STIs) You should be screened each year for STIs, including gonorrhea and chlamydia, if: You are sexually active and are younger than 56 years of age. You are older than 56 years of age and your   health care provider tells you that you are at risk for this type of infection. Your sexual activity has changed since you were last screened, and you are at increased risk for chlamydia or gonorrhea. Ask your health care provider if you are at risk. Ask your health care provider about whether you are at high risk for HIV. Your health care provider  may recommend a prescription medicine to help prevent HIV infection. If you choose to take medicine to prevent HIV, you should first get tested for HIV. You should then be tested every 3 months for as long as you are taking the medicine. Follow these instructions at home: Alcohol use Do not drink alcohol if your health care provider tells you not to drink. If you drink alcohol: Limit how much you have to 0-2 drinks a day. Know how much alcohol is in your drink. In the [place] one drink equals one 12 oz bottle of beer (355 mL), one 5 oz glass of wine (148 mL), or one 1 oz glass of hard liquor (44 mL). Lifestyle Do not use any products that contain nicotine or tobacco. These products include cigarettes, chewing tobacco, and vaping devices, such as e-cigarettes. If you need help quitting, ask your health care provider. Do not use street drugs. Do not share needles. Ask your health care provider for help if you need support or information about quitting drugs. General instructions Schedule regular health, dental, and eye exams. Stay current with your vaccines. Tell your health care provider if: You often feel depressed. You have ever been abused or do not feel safe at home. Summary Adopting a healthy lifestyle and getting preventive care are important in promoting health and wellness. Follow your health care provider's instructions about healthy diet, exercising, and getting tested or screened for diseases. Follow your health care provider's instructions on monitoring your cholesterol and blood pressure. This information is not intended to replace advice given to you by your health care provider. Make sure you discuss any questions you have with your health care provider. Document Revised: 11/18/2020 Document Reviewed: 11/18/2020 Elsevier Patient Education  2023 [employer] Inc.  

## 2022-09-29 NOTE — Patient Instructions (Signed)
  Thank you, [name], for allowing [place] to provide your care today. Today we discussed . . .  > Hypertension       - your blood pressure is high so you need to take a medication       - lisinopril has been causing you to feel lightheaded so we are switching you to losartan       - please continue to take amlodipine        - start keeping a blood pressure log of your measurements at home, bring this to your next visit with [place]  > Diabetes       - we are checking your A1C and will call you with the results       - we are stopping your metformin because you are having side effects       - start to take semaglutide at the higher 1.0mg  dose       - we are starting you on gabapentin, which should also help your hand numbness  > Hyperlipidemia       - start to take your rosuvastatin every day, we will check your cholesterol levels at a future visit   I have ordered the following labs for you:   Lab Orders         Microalbumin / Creatinine Urine Ratio         POC Hbg A1C       Tests ordered today:  none   Referrals ordered today:   Referral Orders  No referral(s) requested today      I have ordered the following medication/changed the following medications:   Stop the following medications: Medications Discontinued During This Encounter  Medication Reason   lisinopril-hydrochlorothiazide (ZESTORETIC) 20-12.5 MG tablet    metFORMIN (GLUCOPHAGE-XR) 500 MG 24 hr tablet    Semaglutide,0.25 or 0.5MG /DOS, 2 MG/3ML SOPN      Start the following medications: Meds ordered this encounter  Medications   Semaglutide, 1 MG/DOSE, 4 MG/3ML SOPN    Sig: Inject 1 mg into the skin once a week.    Dispense:  9 mL    Refill:  0   losartan (COZAAR) 25 MG tablet    Sig: Take 1 tablet (25 mg total) by mouth daily.    Dispense:  30 tablet    Refill:  2      Follow up:  1 month     Remember:  Please start taking losartan instead of the lisinopril-hydrochlorothiazide and keep a  log of your blood pressure readings at home, bring this log to your next visit with [place] in about one month. We have increased the dose of your semaglutide and sent a prescription for gabapentin. Return to clinic in about one month!   Should you have any questions or concerns please call the internal medicine clinic at [phone].     Roswell Nickel, MD [place]

## 2022-09-29 NOTE — Assessment & Plan Note (Signed)
Patient presented to UC on 3-2 for tooth and jaw pain that he describes as pressure. Cardiac workup was negative, he was discharged with naproxen. Next was evaluated by family medicine and diagnosed with viral upper respiratory infection. Prescribed fluticasone, cetirizine, and benzonatate. Although his symptoms persist, they have improved. Denies fever, chills, breath shortness, myalgias, fatigue, cough, and hearing loss. Lungs were clear to auscultation bilaterally and tympanic membranes nonbulging without erythema. Agree with previous assessment of uncomplicated upper respiratory viral infection. Further recommended using hot water from shower to humidify room for symptom relief.  - Continue symptom management with fluticasone, cetirizine, and benzonatate

## 2022-09-29 NOTE — Assessment & Plan Note (Signed)
Patient has history of hyperlipidemia, most recent lipid panel collected 03-2022 demonstrated LDL 102 and TChol 157. Prescribed rosuvastatin, but has been forgetting to take it. Son invested in a pill box to help with adherence. Counseling provided on importance of taking rosuvastatin daily to lower cholesterol.   - Rosuvastatin 20mg  q24 - Check lipid panel around 01-2023

## 2022-09-29 NOTE — Assessment & Plan Note (Addendum)
Patient has history of anxiety, which may explain his tachycardia and high BP readings in clinical setting. Home BP readings have been consistently lower. Reports feeling anxious specifically in social situations. Further investigation into his anxiety at next [facility] visit is warranted, especially given potential physiological consequences.  - Complete GAD-7 at next [facility] visit - Consider starting SNRI

## 2022-09-29 NOTE — Progress Notes (Signed)
Subjective:   John [name] is a 57 y.o. male who presents for Medicare Annual/Subsequent preventive examination.  Review of Systems    Defer to PCP.        Objective:    Today's Vitals   09/29/22 1532 09/29/22 1533  BP: (!) 150/85 (!) 140/91  Pulse: (!) 115 (!) 115  Temp: 97.7 F (36.5 C)   TempSrc: Oral   SpO2: 96%   Weight: (!) 372 lb 4.8 oz (168.9 kg)   Height: 6' (1.829 m)   PainSc:  8    Body mass index is 50.49 kg/m.     09/29/2022    3:39 PM 09/29/2022    3:06 PM 04/01/2022    1:47 PM 12/03/2021    1:39 PM 07/29/2021   12:27 PM 07/23/2020   11:41 AM 08/14/2014    7:27 AM  Advanced Directives  Does Patient Have a Medical Advance Directive? No No No No No No No  Would patient like information on creating a medical advance directive? No - Patient declined No - Patient declined No - Patient declined No - Patient declined  No - Patient declined No - patient declined information    Current Medications (verified) Outpatient Encounter Medications as of 09/29/2022  Medication Sig   Accu-Chek Softclix Lancets lancets Use 1 to 2 times a day   amLODipine (NORVASC) 5 MG tablet Take 1 tablet (5 mg total) by mouth at bedtime.   BD PEN NEEDLE NANO 2ND GEN 32G X 4 MM MISC Use needle with insulin pen to inject your insulin.   benzonatate (TESSALON PERLES) 100 MG capsule Take 1 capsule (100 mg total) by mouth 3 (three) times daily as needed.   Blood Glucose Monitoring Suppl (ACCU-CHEK GUIDE) w/Device KIT Use to check blood sugars 1-2 times daily.   cetirizine (ZYRTEC ALLERGY) 10 MG tablet Take 1 tablet (10 mg total) by mouth daily.   fluticasone (FLONASE) 50 MCG/ACT nasal spray Place 2 sprays into both nostrils daily.   glucose blood test strip Use to test 1 to 2 times a day   ibuprofen (ADVIL) 600 MG tablet Take 1 tablet (600 mg total) by mouth every 8 (eight) hours as needed.   insulin glargine (LANTUS SOLOSTAR) 100 UNIT/ML Solostar Pen Inject 25 Units into the skin daily.    lisinopril-hydrochlorothiazide (ZESTORETIC) 20-12.5 MG tablet Take 2 tablets by mouth once daily   metFORMIN (GLUCOPHAGE-XR) 500 MG 24 hr tablet Take 1 tablet (500 mg total) by mouth daily with breakfast. For the first week: take 1 tablet once a day. For the second week: take 1 tablet in the AM and 1 tablet at the evening. For the third week: take 2 tablets (total of 1000 mg) in the AM and 1 tablet at the evening. For the forth week: take 2 tablets (total of 1000 mg) in the AM and 2 tablets (total of 1000 mg) at the evening.   naproxen (NAPROSYN) 500 MG tablet Take 1 tablet (500 mg total) by mouth 2 (two) times daily.   omeprazole (PRILOSEC) 20 MG capsule Take 1 capsule (20 mg total) by mouth daily.   rosuvastatin (CRESTOR) 20 MG tablet Take 1 tablet (20 mg total) by mouth daily.   Semaglutide,0.25 or 0.5MG /DOS, 2 MG/3ML SOPN Inject 0.5 mg weekly.   No facility-administered encounter medications on file as of 09/29/2022.    Allergies (verified) Patient has no active allergies.   History: Past Medical History:  Diagnosis Date   Anxiety disorder  Erectile dysfunction    Hyperlipidemia    Hypertension    Obesity, morbid, BMI 50 or higher ([place])    Poorly controlled type 2 diabetes mellitus (HCC)    Schizophrenia (HCC)    Sleep apnea    Past Surgical History:  Procedure Laterality Date   ANKLE SURGERY     APPENDECTOMY     Family History  Problem Relation Age of Onset   Breast cancer Mother    Alcohol abuse Father    Hypertension Sister    Breast cancer Sister    Hypertension Brother    Diabetes type II Brother    Social History   Socioeconomic History   Marital status: Widowed    Spouse name: Not on file   Number of children: Not on file   Years of education: Not on file   Highest education level: Not on file  Occupational History   Not on file  Tobacco Use   Smoking status: Never   Smokeless tobacco: Never  Vaping Use   Vaping Use: Never used  Substance and Sexual  Activity   Alcohol use: Yes    Comment: rare in the last two years, 1x in 2-3 months   Drug use: Yes    Types: Marijuana    Comment: Occasional   Sexual activity: Yes  Other Topics Concern   Not on file  Social History Narrative   Not on file   Social Determinants of Health   Financial Resource Strain: Low Risk  (09/29/2022)   Overall Financial Resource Strain (CARDIA)    Difficulty of Paying Living Expenses: Not very hard  Food Insecurity: Food Insecurity Present (09/29/2022)   Hunger Vital Sign    Worried About Running Out of Food in the Last Year: Sometimes true    Ran Out of Food in the Last Year: Sometimes true  Transportation Needs: No Transportation Needs (09/29/2022)   PRAPARE - Hydrologist (Medical): No    Lack of Transportation (Non-Medical): No  Physical Activity: Insufficiently Active (09/29/2022)   Exercise Vital Sign    Days of Exercise per Week: 2 days    Minutes of Exercise per Session: 10 min  Stress: Stress Concern Present (09/29/2022)   [place]    Feeling of Stress : Very much  Social Connections: Socially Isolated (09/29/2022)   Social Connection and Isolation Panel [NHANES]    Frequency of Communication with Friends and Family: More than three times a week    Frequency of Social Gatherings with Friends and Family: Never    Attends Religious Services: Never    Marine scientist or Organizations: No    Attends Archivist Meetings: Never    Marital Status: Widowed    Tobacco Counseling Counseling given: Not Answered   Clinical Intake:  Pre-visit preparation completed: Yes  Pain : 0-10 Pain Score: 8  Pain Type: Acute pain Pain Location: Jaw Pain Orientation: Left Pain Descriptors / Indicators: Aching, Pressure Pain Onset: 1 to 4 weeks ago Pain Frequency: Constant     BMI - recorded: 50.49 Nutritional Status: BMI > 30   Obese Nutritional Risks: None Diabetes: Yes CBG done?: No Did pt. bring in CBG monitor from home?: No  How often do you need to have someone help you when you read instructions, pamphlets, or other written materials from your doctor or pharmacy?: 1 - Never What is the last grade level you completed in school?: 10th  grade  Diabetic?Yes  Interpreter Needed?: No  Information entered by :: Nikash Mortensen, [place] 09/29/2022 3:34PM   Activities of Daily Living    09/29/2022    3:40 PM 09/29/2022    3:05 PM  In your present state of health, do you have any difficulty performing the following activities:  Hearing? 0 0  Vision? 0 0  Difficulty concentrating or making decisions? 0 0  Walking or climbing stairs? 1 1  Dressing or bathing? 0 0  Doing errands, shopping? 0 0  Preparing Food and eating ? N   Using the Toilet? N   In the past six months, have you accidently leaked urine? N   Do you have problems with loss of bowel control? N   Managing your Medications? N   Managing your Finances? N   Housekeeping or managing your Housekeeping? N     Patient Care Team: Idamae Schuller, MD as PCP - General (Internal Medicine)  Indicate any recent Medical Services you may have received from other than [facility] providers in the past year (date may be approximate).     Assessment:   This is a routine wellness examination for John [name].  Hearing/Vision screen No results found.  Dietary issues and exercise activities discussed:     Goals Addressed   None   Depression Screen    09/29/2022    3:37 PM 09/29/2022    3:26 PM 04/01/2022    3:17 PM 12/03/2021    1:38 PM 07/29/2021   12:27 PM 07/23/2020   11:40 AM 07/26/2019   10:03 AM  PHQ 2/9 Scores  PHQ - 2 Score 4 4 1 2 1  0 0  PHQ- 9 Score 13 13 11 7        Fall Risk    09/29/2022    3:40 PM 09/29/2022    3:06 PM 04/01/2022    1:46 PM 12/03/2021    1:37 PM 07/29/2021   12:27 PM  [place] in the past year? 0 0 0 0 0  Number falls in past yr:  0 0 0 0   Injury with Fall? 0 0 0 0   Risk for fall due to : No Fall Risks No Fall Risks  No Fall Risks Medication side effect  Follow up Falls evaluation completed Falls evaluation completed Falls evaluation completed;Falls prevention discussed Falls prevention discussed;Falls evaluation completed Falls evaluation completed;Education provided;Falls prevention discussed    FALL RISK PREVENTION PERTAINING TO THE HOME:  Any stairs in or around the home? No  If so, are there any without handrails? No  Home free of loose throw rugs in walkways, pet beds, electrical cords, etc? Yes  Adequate lighting in your home to reduce risk of falls? Yes   ASSISTIVE DEVICES UTILIZED TO PREVENT FALLS:  Life alert? No  Use of a cane, walker or w/c? No  Grab bars in the bathroom? Yes  Shower chair or bench in shower? No  Elevated toilet seat or a handicapped toilet? No   TIMED UP AND GO:  Was the test performed? No .  Length of time to ambulate 10 feet: n/a sec.   Gait steady and fast without use of assistive device  Cognitive Function:        09/29/2022    3:40 PM 07/29/2021   12:28 PM 07/23/2020    1:23 PM  6CIT Screen  What Year? 0 points 0 points 0 points  What month? 0 points 0 points 0 points  What time? 0  points 0 points   Count back from 20 0 points 0 points 0 points  Months in reverse 0 points 4 points 0 points  Repeat phrase 0 points 2 points 0 points  Total Score 0 points 6 points     Immunizations Immunization History  Administered Date(s) Administered   Influenza,inj,Quad PF,6+ Mos 07/25/2019, 04/01/2022   PFIZER(Purple Top)SARS-COV-2 Vaccination 10/06/2019, 10/31/2019   PNEUMOCOCCAL CONJUGATE-20 04/01/2022   Pneumococcal Polysaccharide-23 02/28/2020    TDAP status: Due, Education has been provided regarding the importance of this vaccine. Advised may receive this vaccine at local pharmacy or Health Dept. Aware to provide a copy of the vaccination record if obtained from  local pharmacy or Health Dept. Verbalized acceptance and understanding.  Flu Vaccine status: Up to date   Covid-19 vaccine status: Information provided on how to obtain vaccines.   Qualifies for Shingles Vaccine? No   Zostavax completed No     Screening Tests Health Maintenance  Topic Date Due   Diabetic kidney evaluation - Urine ACR  Never done   DTaP/Tdap/Td (1 - Tdap) Never done   Zoster Vaccines- Shingrix (1 of 2) Never done   COLONOSCOPY (Pts 45-75yrs Insurance coverage will need to be confirmed)  Never done   COVID-19 Vaccine (3 - Pfizer risk series) 11/28/2019   OPHTHALMOLOGY EXAM  03/21/2021   FOOT EXAM  02/04/2022   HIV Screening  12/04/2022 (Originally 06/09/1981)   HEMOGLOBIN A1C  09/30/2022   Diabetic kidney evaluation - eGFR measurement  12/04/2022   Medicare Annual Wellness (AWV)  09/29/2023   INFLUENZA VACCINE  Completed   Hepatitis C Screening  Completed   HPV VACCINES  Aged Out    Health Maintenance  Health Maintenance Due  Topic Date Due   Diabetic kidney evaluation - Urine ACR  Never done   DTaP/Tdap/Td (1 - Tdap) Never done   Zoster Vaccines- Shingrix (1 of 2) Never done   COLONOSCOPY (Pts 45-14yrs Insurance coverage will need to be confirmed)  Never done   COVID-19 Vaccine (3 - Pfizer risk series) 11/28/2019   OPHTHALMOLOGY EXAM  03/21/2021   FOOT EXAM  02/04/2022   Colorectal cancer screening: Defer to PCP.   Lung Cancer Screening: (Low Dose CT Chest recommended if Age 65-80 years, 30 pack-year currently smoking OR have quit w/in 15years.) does not qualify.   Lung Cancer Screening Referral: Defer to PCP.   Additional Screening:  Hepatitis C Screening: does qualify; Completed 02/04/2021  Vision Screening: Recommended annual ophthalmology exams for early detection of glaucoma and other disorders of the eye. Is the patient up to date with their annual eye exam?  No  Who is the provider or what is the name of the office in which the patient  attends annual eye exams? Defer to PCP.  If pt is not established with a provider, would they like to be referred to a provider to establish care? Yes .   Dental Screening: Recommended annual dental exams for proper oral hygiene  Community Resource Referral / Chronic Care Management: CRR required this visit?  No   CCM required this visit?  No      Plan:     I have personally reviewed and noted the following in the patient's chart:   Medical and social history Use of alcohol, tobacco or illicit drugs  Current medications and supplements including opioid prescriptions. Patient is not currently taking opioid prescriptions. Functional ability and status Nutritional status Physical activity Advanced directives List of other physicians Hospitalizations, surgeries, and  ER visits in previous 12 months Vitals Screenings to include cognitive, depression, and falls Referrals and appointments  In addition, I have reviewed and discussed with patient certain preventive protocols, quality metrics, and best practice recommendations. A written personalized care plan for preventive services as well as general preventive health recommendations were provided to patient.     Coyle Stordahl, CMA   09/29/2022   Nurse Notes: Face to Face.  John [name] , Thank you for taking time to come for your Medicare Wellness Visit. I appreciate your ongoing commitment to your health goals. Please review the following plan we discussed and let me know if I can assist you in the future.   These are the goals we discussed:  Goals      Patient Stated     Continue to lose weight      Patient Stated     07/29/2021, wants to lose 50-60 pounds        This is a list of the screening recommended for you and due dates:  Health Maintenance  Topic Date Due   Yearly kidney health urinalysis for diabetes  Never done   DTaP/Tdap/Td vaccine (1 - Tdap) Never done   Zoster (Shingles) Vaccine (1 of 2) Never done   Colon  Cancer Screening  Never done   COVID-19 Vaccine (3 - Pfizer risk series) 11/28/2019   Eye exam for diabetics  03/21/2021   Complete foot exam   02/04/2022   HIV Screening  12/04/2022*   Hemoglobin A1C  09/30/2022   Yearly kidney function blood test for diabetes  12/04/2022   Medicare Annual Wellness Visit  09/29/2023   Flu Shot  Completed   Hepatitis C Screening: USPSTF Recommendation to screen - Ages 18-79 yo.  Completed   HPV Vaccine  Aged Out  *Topic was postponed. The date shown is not the original due date.

## 2022-09-29 NOTE — Assessment & Plan Note (Signed)
Patient has history of diabetes managed with glargine 25U daily and semaglutide, which was prescribed at prior visit in 03-2022. Also prescribed metformin, but has self-discontinued due to gastrointestinal side effects. Most recent A1C collected 03-2022 was 9.7. Discussed importance of achieving better glycemic control. Tolerating semaglutide well without any abdominal pain, bloating, nausea, diarrhea, or constipation. Depending on A1C results, may need to start SGlT-2 inhibitor at next visit. Of note, patient declines uAlb:Cr until next visit due describing an inability to produce sample in timely manner.  - Discontinue metformin - Continue insulin glargine 25U q24 - Increase semaglutide from 0.5mg  to 1mg  q168 - Check A1C - Check uAlb:Cr at next visit - Consider SGlT-2 inhibitor at next [facility] visit

## 2022-09-29 NOTE — Assessment & Plan Note (Signed)
Patient reports decreased sensation, specifically to temperature, in his LUE. Denies pain, weakness, discoloration, and trauma. Exam revealed decreased sensation along C8 distribution through entire LUE including shoulder. Strength intact and symmetrical across muscles innervated by median-ulner-radial nerves. Findings and history are consistent with cervical radiculopathy. Low concern for peripheral nerve entrapment or compression. In the past, patient had been on gabapentin and this improved his upper extremity symptoms.   - Start gabapentin 100mg  q8, can increase as needed

## 2022-10-06 ENCOUNTER — Other Ambulatory Visit: Payer: Self-pay | Admitting: Student

## 2022-10-06 ENCOUNTER — Other Ambulatory Visit: Payer: Self-pay | Admitting: Internal Medicine

## 2022-10-06 DIAGNOSIS — E118 Type 2 diabetes mellitus with unspecified complications: Secondary | ICD-10-CM

## 2022-10-06 MED ORDER — EMPAGLIFLOZIN 10 MG PO TABS: 10.0000 mg | ORAL_TABLET | Freq: Every day | ORAL | 2 refills | Status: DC

## 2022-10-07 NOTE — Progress Notes (Signed)
Internal Medicine Clinic Attending  Case discussed with Dr. Jodi Mourning  At the time of the visit.  We reviewed the resident's history and exam and pertinent patient test results.  I agree with the assessment, diagnosis, and plan of care documented in the resident's note.   Unfortunately, A1C is >14 today. Dr. Jodi Mourning has called the patient to discuss the plan. He does not tolerate Metformin. Add SGLT2 inhibitor now, increase semaglutide to 1mg  weekly, and continue glargine 25 units daily. Dr. Jodi Mourning will ensure patient has close outpatient follow up with [facility] clinic

## 2022-10-13 NOTE — Progress Notes (Signed)
I reviewed the AWV findings with the provider who conducted the visit. I was present in the office suite and immediately available to provide assistance and direction throughout the time the service was provided.  

## 2022-10-14 ENCOUNTER — Other Ambulatory Visit: Payer: Self-pay | Admitting: Pharmacist

## 2022-10-14 NOTE — Progress Notes (Signed)
10/14/2022 Name: John [name] MRN: [id number] DOB: 1966-04-26  Chief Complaint  Patient presents with   Medication Management   Diabetes   Hypertension   Hyperlipidemia    John [name] is a 57 y.o. year old male who presented for a telephone visit.   They were referred to the pharmacist by a quality report for assistance in managing diabetes, hypertension, and complex medication management.   Patient failed Control in Diabetes, Adherence in Cholesterol, and Adherence in Hypertension.   Subjective:  Care Team: Primary Care Provider: Idamae Schuller, MD ; Next Scheduled Visit: 11/02/22  Medication Access/Adherence  Current Pharmacy:  [facility] [place] - [address] [place]) [place] [address] Phone: [phone] Fax: [place], [place] [facility] [place] [address] Phone: [phone] Fax: [phone]   Patient reports affordability concerns with their medications: No  Patient reports access/transportation concerns to their pharmacy: No  Patient reports adherence concerns with their medications:  No    Reports he has been using his pill box since his last visit with Dr. Jodi Mourning. Notes he has not missed any doses.   Diabetes:  Current medications: Jardiance 10 mg daily, Ozempic 1 mg weekly, Lantus 25 units daily   Denies GI upset since increasing Ozempic dose. Does note decrease in appetite.   Current glucose readings: reports readings now <300, occasionally readings <130  Patient denies hypoglycemic s/sx including dizziness, shakiness, sweating. Patient denies hyperglycemic symptoms including polyuria, polydipsia, polyphagia, nocturia, neuropathy, blurred vision.  Current meal patterns:  - Breakfast: grits, eggs; water;  - Lunch: sometimes skips;  - Supper: tries to focus  - Snacks: has cut back  since last A1c - Drinks: mostly water   Hypertension:  Current medications: losartan 25 mg daily, amlodipine 5 mg daily   Patient does not have a validated, automated, upper arm home BP cuff Current blood pressure readings readings: not checking  Patient denies hypotensive s/sx including dizziness, lightheadedness.  Patient denies hypertensive symptoms including headache, chest pain, shortness of breath  Hyperlipidemia/ASCVD Risk Reduction  Current lipid lowering medications: rosuvastatin 20 mg daily  Objective:  Lab Results  Component Value Date   HGBA1C >14.0 (A) 09/29/2022    Lab Results  Component Value Date   CREATININE 0.90 12/03/2021   BUN 13 12/03/2021   NA 136 12/03/2021   K 4.3 12/03/2021   CL 97 12/03/2021   CO2 23 12/03/2021    Lab Results  Component Value Date   CHOL 157 04/01/2022   HDL 42 04/01/2022   LDLCALC 102 (H) 04/01/2022   TRIG 67 04/01/2022   CHOLHDL 3.7 04/01/2022    Medications Reviewed Today     Reviewed by Osker Mason, RPH-CPP (Pharmacist) on 10/14/22 at 1620  Med List Status: <None>   Medication Order Taking? Sig Documenting Provider Last Dose Status Informant  Accu-Chek Softclix Lancets lancets [id number] Yes Use 1 to 2 times a day Idamae Schuller, MD Taking Active   amLODipine (NORVASC) 5 MG tablet [id number] Yes Take 1 tablet (5 mg total) by mouth at bedtime. Wayland Denis, MD Taking Active   BD PEN NEEDLE NANO 2ND GEN 32G X 4 MM MISC [id number] Yes Use needle with insulin pen to inject your insulin. Idamae Schuller, MD Taking Active   Blood Glucose Monitoring Suppl (ACCU-CHEK GUIDE) w/Device KIT [id number] Yes Use to check  blood sugars 1-2 times daily. Idamae Schuller, MD Taking Active   cetirizine [facility] ALLERGY) 10 MG tablet [id number] Yes Take 1 tablet (10 mg total) by mouth daily. Sharion Balloon, FNP Taking Active   empagliflozin (JARDIANCE) 10 MG TABS tablet [id number] Yes Take 1 tablet (10 mg total) by mouth daily before  breakfast. Serita Butcher, MD Taking Active   fluticasone [facility]) 50 MCG/ACT nasal spray [id number] Yes Place 2 sprays into both nostrils daily. Sharion Balloon, FNP Taking Active   gabapentin (NEURONTIN) 100 MG capsule [id number] Yes Take 1 capsule (100 mg total) by mouth 3 (three) times daily. Serita Butcher, MD Taking Active   glucose blood test strip [id number] Yes Use to test 1 to 2 times a day Idamae Schuller, MD Taking Active   LANTUS SOLOSTAR 100 UNIT/ML Solostar Pen [id number] Yes INJECT 25 UNITS SUBCUTANEOUSLY ONCE DAILY Idamae Schuller, MD Taking Active   losartan (COZAAR) 25 MG tablet [id number] Yes Take 1 tablet (25 mg total) by mouth daily. Serita Butcher, MD Taking Active   naproxen (NAPROSYN) 500 MG tablet [id number] No Take 1 tablet (500 mg total) by mouth 2 (two) times daily.  Patient not taking: Reported on 10/14/2022   [place], [place] N, FNP Not Taking Active   omeprazole (PRILOSEC) 20 MG capsule [id number] No Take 1 capsule (20 mg total) by mouth daily.  Patient not taking: Reported on 10/14/2022   Mar Daring, PA-C Not Taking Active   rosuvastatin (CRESTOR) 20 MG tablet [id number] Yes Take 1 tablet (20 mg total) by mouth daily. Idamae Schuller, MD Taking Active   Semaglutide, 1 MG/DOSE, 4 MG/3ML Bonney Aid [id number] Yes Inject 1 mg into the skin once a week. Serita Butcher, MD Taking Active               Assessment/Plan:   Diabetes: - Currently uncontrolled but improved - Reviewed long term cardiovascular and renal outcomes of uncontrolled blood sugar - Reviewed goal A1c, goal fasting, and goal 2 hour post prandial glucose - Reviewed dietary modifications including: focus on lean proteins, vegetables, whole grains, reduced sugars. DM educator visit upcoming. - Recommend to continue current regimen. Follow up with Dr. Humphrey Rolls as scheduled.   Hypertension: - Currently uncontrolled but anticipate improvement with adherence - Reviewed long term cardiovascular and renal outcomes of  uncontrolled blood pressure - Reviewed appropriate blood pressure monitoring technique and reviewed goal blood pressure. Recommended to check home blood pressure and heart rate periodically. Discussed using OTC benefits or ordering an Omron upper arm BP cuff with XL cuff.  - Recommend to continue current regimen at this time  Hyperlipidemia/ASCVD Risk Reduction: - Currently uncontrolled but anticipate improvement with adherence. Will collaborate with PCP to send rosuvastatin for 90 day supply. - Recommend to continue current regimen at this time   Follow Up Plan: phone call in 6 weeks  Catie TJodi Mourning, PharmD, [place], [facility] [phone]

## 2022-10-14 NOTE — Patient Instructions (Signed)
Brenley,   It was great talking to you today!  Check your blood sugars twice daily:  1) Fasting, first thing in the morning before breakfast and  2) 2 hours after your largest meal.   For a goal A1c of less than 7%, goal fasting readings are less than 130 and goal 2 hour after meal readings are less than 180.    Check your blood pressure once weekly, and any time you have concerning symptoms like headache, chest pain, dizziness, shortness of breath, or vision changes.   Our goal is less than 130/80.  To appropriately check your blood pressure, make sure you do the following:  1) Avoid caffeine, exercise, or tobacco products for 30 minutes before checking. Empty your bladder. 2) Sit with your back supported in a flat-backed chair. Rest your arm on something flat (arm of the chair, table, etc). 3) Sit still with your feet flat on the floor, resting, for at least 5 minutes.  4) Check your blood pressure. Take 1-2 readings.  5) Write down these readings and bring with you to any provider appointments.  Bring your home blood pressure machine with you to a provider's office for accuracy comparison at least once a year.   Make sure you take your blood pressure medications before you come to any office visit, even if you were asked to fast for labs.    Take care!  Catie Hedwig Morton, PharmD, [place], [facility] [phone]

## 2022-10-19 ENCOUNTER — Other Ambulatory Visit: Payer: Self-pay | Admitting: Internal Medicine

## 2022-10-19 DIAGNOSIS — E1169 Type 2 diabetes mellitus with other specified complication: Secondary | ICD-10-CM

## 2022-10-19 MED ORDER — ROSUVASTATIN CALCIUM 20 MG PO TABS: 20.0000 mg | ORAL_TABLET | Freq: Every day | ORAL | 3 refills | Status: DC

## 2022-10-27 ENCOUNTER — Other Ambulatory Visit: Payer: Self-pay

## 2022-10-27 MED ORDER — LOSARTAN POTASSIUM 25 MG PO TABS: 25.0000 mg | ORAL_TABLET | Freq: Every day | ORAL | 3 refills | Status: DC

## 2022-10-27 NOTE — Telephone Encounter (Signed)
Patient is requesting a 90 day supply for better adherence

## 2022-11-02 ENCOUNTER — Encounter: Payer: 59 | Admitting: Internal Medicine

## 2022-11-02 ENCOUNTER — Encounter: Payer: 59 | Admitting: Dietician

## 2022-11-02 DIAGNOSIS — G4733 Obstructive sleep apnea (adult) (pediatric): Secondary | ICD-10-CM | POA: Diagnosis not present

## 2022-11-02 NOTE — Progress Notes (Deleted)
   CC: DMII follow up  HPI:  John [name] is a 57 y.o. with medical history of HTN, HLD, DMII, Class III obesity, and schizophrenia presenting to [facility] for DMII follow up.  Please see problem-based list for further details, assessments, and plans.  Past Medical History:  Diagnosis Date   Anxiety disorder    Erectile dysfunction    Hyperlipidemia    Hypertension    Obesity, morbid, BMI 50 or higher (HCC)    Poorly controlled type 2 diabetes mellitus (HCC)    Schizophrenia (HCC)    Sleep apnea     Current Outpatient Medications (Endocrine & Metabolic):    empagliflozin (JARDIANCE) 10 MG TABS tablet, Take 1 tablet (10 mg total) by mouth daily before breakfast.   LANTUS SOLOSTAR 100 UNIT/ML Solostar Pen, INJECT 25 UNITS SUBCUTANEOUSLY ONCE DAILY   Semaglutide, 1 MG/DOSE, 4 MG/3ML SOPN, Inject 1 mg into the skin once a week.  Current Outpatient Medications (Cardiovascular):    amLODipine (NORVASC) 5 MG tablet, Take 1 tablet (5 mg total) by mouth at bedtime.   losartan (COZAAR) 25 MG tablet, Take 1 tablet (25 mg total) by mouth daily.   rosuvastatin (CRESTOR) 20 MG tablet, Take 1 tablet (20 mg total) by mouth daily.  Current Outpatient Medications (Respiratory):    cetirizine (ZYRTEC ALLERGY) 10 MG tablet, Take 1 tablet (10 mg total) by mouth daily.   fluticasone (FLONASE) 50 MCG/ACT nasal spray, Place 2 sprays into both nostrils daily.  Current Outpatient Medications (Analgesics):    naproxen (NAPROSYN) 500 MG tablet, Take 1 tablet (500 mg total) by mouth 2 (two) times daily. (Patient not taking: Reported on 10/14/2022)   Current Outpatient Medications (Other):    Accu-Chek Softclix Lancets lancets, Use 1 to 2 times a day   BD PEN NEEDLE NANO 2ND GEN 32G X 4 MM MISC, Use needle with insulin pen to inject your insulin.   Blood Glucose Monitoring Suppl (ACCU-CHEK GUIDE) w/Device KIT, Use to check blood sugars 1-2 times daily.   gabapentin (NEURONTIN) 100 MG capsule, Take 1 capsule  (100 mg total) by mouth 3 (three) times daily.   glucose blood test strip, Use to test 1 to 2 times a day   omeprazole (PRILOSEC) 20 MG capsule, Take 1 capsule (20 mg total) by mouth daily. (Patient not taking: Reported on 10/14/2022)  Review of Systems:  Review of system negative unless stated in the problem list or HPI.    Physical Exam:  There were no vitals filed for this visit. Physical Exam General: NAD HENT: NCAT Lungs: CTAB, no wheeze, rhonchi or rales.  Cardiovascular: Normal heart sounds, no r/m/g, 2+ pulses in all extremities. No LE edema Abdomen: No TTP, normal bowel sounds MSK: No asymmetry or muscle atrophy.  Skin: no lesions noted on exposed skin Neuro: Alert and oriented x4. CN grossly intact Psych: Normal mood and normal affect   Assessment & Plan:   No problem-specific Assessment & Plan notes found for this encounter.   See Encounters Tab for problem based charting.  Patient Discussed with Dr. {NAMES:[id number]::"Guilloud","Hoffman","Mullen","Narendra","Vincent","Machen","Lau","Hatcher","Williams"} John Abbot, MD John [name]. [facility] Internal Medicine Residency, PGY-2   DMII Last A1c was >14.0. Pt started on Jardiance 10 mg qd, Lantus 25 units qd, Ozempic 1 mg qd.  HTN On amlodipine 10 mg qd, Losartan 25 mg qd. Renal normal fxn in 11/2021. Plan to repeat this visit.  Care Gaps ACR this visit. Foot exam?

## 2022-11-05 ENCOUNTER — Telehealth: Payer: Self-pay

## 2022-11-05 NOTE — Progress Notes (Signed)
  Care Coordination Note  11/05/2022 Name: John [name] MRN: [id number] DOB: 1966/01/09  KEY CEN is a 57 y.o. year old male who is a primary care patient of [name], MD and is actively engaged with the Chronic Care Management team. I reached out to [name] by phone today to assist with re-scheduling a follow up visit with the Pharmacist  Follow up plan: Unsuccessful telephone outreach attempt made. A HIPAA compliant phone message was left for the patient providing contact information and requesting a return call.  The care management team will reach out to the patient again over the next 7 days.  If patient returns call to provider office, please advise to call CCM Care Guide Penne Lash  at [phone]  Penne Lash, RMA Care Guide [facility]  [place], [place] [address] Direct Dial: [phone] [email or web address]

## 2022-11-06 DIAGNOSIS — Z131 Encounter for screening for diabetes mellitus: Secondary | ICD-10-CM | POA: Diagnosis not present

## 2022-11-06 DIAGNOSIS — Z1159 Encounter for screening for other viral diseases: Secondary | ICD-10-CM | POA: Diagnosis not present

## 2022-11-06 DIAGNOSIS — M545 Low back pain, unspecified: Secondary | ICD-10-CM | POA: Diagnosis not present

## 2022-11-06 DIAGNOSIS — M129 Arthropathy, unspecified: Secondary | ICD-10-CM | POA: Diagnosis not present

## 2022-11-06 DIAGNOSIS — M25572 Pain in left ankle and joints of left foot: Secondary | ICD-10-CM | POA: Diagnosis not present

## 2022-11-06 DIAGNOSIS — G8929 Other chronic pain: Secondary | ICD-10-CM | POA: Diagnosis not present

## 2022-11-06 DIAGNOSIS — Z79899 Other long term (current) drug therapy: Secondary | ICD-10-CM | POA: Diagnosis not present

## 2022-11-06 DIAGNOSIS — R5383 Other fatigue: Secondary | ICD-10-CM | POA: Diagnosis not present

## 2022-11-06 DIAGNOSIS — E559 Vitamin D deficiency, unspecified: Secondary | ICD-10-CM | POA: Diagnosis not present

## 2022-11-06 DIAGNOSIS — Z0000 Encounter for general adult medical examination without abnormal findings: Secondary | ICD-10-CM | POA: Diagnosis not present

## 2022-11-09 ENCOUNTER — Telehealth: Payer: Self-pay | Admitting: *Deleted

## 2022-11-09 NOTE — Telephone Encounter (Signed)
Noted thank you

## 2022-11-09 NOTE — Telephone Encounter (Signed)
Patient was called for appt reminder and stated his mom accidentally threw out all his meds. He is advised to call insurance co so they can provide an override to pharmacy. States he will call now. Appt confirmed for tomorrow at 0915.

## 2022-11-10 ENCOUNTER — Encounter: Payer: 59 | Admitting: Internal Medicine

## 2022-11-10 ENCOUNTER — Encounter: Payer: 59 | Admitting: Dietician

## 2022-11-11 ENCOUNTER — Other Ambulatory Visit: Payer: Self-pay

## 2022-11-11 ENCOUNTER — Ambulatory Visit (INDEPENDENT_AMBULATORY_CARE_PROVIDER_SITE_OTHER): Payer: 59 | Admitting: Student

## 2022-11-11 ENCOUNTER — Ambulatory Visit (INDEPENDENT_AMBULATORY_CARE_PROVIDER_SITE_OTHER): Payer: 59 | Admitting: Dietician

## 2022-11-11 DIAGNOSIS — E118 Type 2 diabetes mellitus with unspecified complications: Secondary | ICD-10-CM

## 2022-11-11 DIAGNOSIS — F439 Reaction to severe stress, unspecified: Secondary | ICD-10-CM

## 2022-11-11 MED ORDER — DEXCOM G7 SENSOR MISC: 3 refills | Status: DC

## 2022-11-11 MED ORDER — OZEMPIC (2 MG/DOSE) 8 MG/3ML ~~LOC~~ SOPN: 2.0000 mg | PEN_INJECTOR | SUBCUTANEOUS | 3 refills | Status: DC

## 2022-11-11 MED ORDER — SEMAGLUTIDE (1 MG/DOSE) 4 MG/3ML ~~LOC~~ SOPN: 2.0000 mg | PEN_INJECTOR | SUBCUTANEOUS | 3 refills | Status: DC

## 2022-11-11 MED ORDER — DEXCOM G7 RECEIVER DEVI: 3 refills | Status: DC

## 2022-11-11 MED ORDER — LANTUS SOLOSTAR 100 UNIT/ML ~~LOC~~ SOPN
30.0000 [IU] | PEN_INJECTOR | Freq: Every day | SUBCUTANEOUS | 0 refills | Status: DC
Start: 2022-11-11 — End: 2022-12-04

## 2022-11-11 MED ORDER — LOSARTAN POTASSIUM 25 MG PO TABS: 25.0000 mg | ORAL_TABLET | Freq: Every day | ORAL | 3 refills | Status: DC

## 2022-11-11 NOTE — Progress Notes (Signed)
   I connected with  [name] on 11/11/22 by telephone and verified that I am speaking with the correct person using two identifiers.   I discussed the limitations of evaluation and management by telemedicine. The patient expressed understanding and agreed to proceed.  CC: diabetes follow up  This is a telephone encounter between John [name] and [name] on 11/11/2022 for diabetes follow up. The visit was conducted with the patient located at home and [name] at [facility]. The patient's identity was confirmed using their DOB and current address. The patient has consented to being evaluated through a telephone encounter and understands the associated risks (an examination cannot be done and the patient may need to come in for an appointment) / benefits (allows the patient to remain at home, decreasing exposure to coronavirus). I personally spent 18 minutes on medical discussion.   HPI:  [name] is a 57 y.o. with PMH as below.   Please see A&P for assessment of the patient's acute and chronic medical conditions.   Past Medical History:  Diagnosis Date   Anxiety disorder    Erectile dysfunction    Hyperlipidemia    Hypertension    Obesity, morbid, BMI 50 or higher (HCC)    Poorly controlled type 2 diabetes mellitus (HCC)    Schizophrenia (HCC)    Sleep apnea    Assessment & Plan:   Type 2 diabetes mellitus with unspecified complications (HCC) Poorly controlled type 2 diabetes with hemoglobin A1c of greater than 14% at last visit.  Reports good adherence with empagliflozin, insulin glargine, and semaglutide.  Acknowledges that he does not check his blood sugar in the mornings before meals as instructed.  Going to try to send Dexcom CGM supplies today.  Will increase GLP-1 receptor agonist.  Increase insulin, on a relatively low dose for weight. - Follow-up CGM results - Increase semaglutide to 2 mg weekly - Increase insulin glargine to 30 units nightly     Patient discussed with Dr. Elige Ko MD [facility] Internal Medicine  PGY-1 Pager: [phone] Date 11/11/2022  Time 5:55 PM

## 2022-11-11 NOTE — Assessment & Plan Note (Signed)
Poorly controlled type 2 diabetes with hemoglobin A1c of greater than 14% at last visit.  Reports good adherence with empagliflozin, insulin glargine, and semaglutide.  Acknowledges that he does not check his blood sugar in the mornings before meals as instructed.  Going to try to send Dexcom CGM supplies today.  Will increase GLP-1 receptor agonist.  Increase insulin, on a relatively low dose for weight. - Follow-up CGM results - Increase semaglutide to 2 mg weekly - Increase insulin glargine to 30 units nightly

## 2022-11-11 NOTE — Addendum Note (Signed)
Addended by: [name] on: 11/11/2022 06:21 PM   Modules accepted: Orders

## 2022-11-11 NOTE — Patient Instructions (Addendum)
Dear John [name],   It sounds like you are doing a good job with the food. What you described was a low carbohydrate meal plan.  It is important to eat at least 3 balanced meals (see handout- 1/4 plate protein, 1/4 plate starch, 1/2 plate non starchy vegetables)  to get the nutrients your body needs. Eating 2-3 cups of vegetables, 1-2 servings whole grains like whole wheat bread crackers and cereal, brown rice and small portions of fruits each day can help you get the nutrients you needs and lower your blood sugar and blood pressure.   Healthy meats- fish, [place] and chicken, lowfat beef and pork okay a few times a week  Healthy snacks- combine 1 carbohydrate portion with 1 protein- for example- half a peanut butter sandwich, a few sardines and whole grain crackers, a small apple and peanut butter, 1 cup strawberries and 1/2 c greek low sugar yogurt  Portion sizes-see handout  Lupita Leash [phone]

## 2022-11-11 NOTE — Progress Notes (Signed)
Diabetes Self-Management Education  Visit Type: First/Initial  Appt. Start Time: 135 PM Appt. End Time: 215 PM  11/11/2022  [name], identified by name and date of birth, is a 57 y.o. male with a diagnosis of Diabetes: Type 2.   ASSESSMENT  Burlin D Ferrington's goals are :Decrease weight, Get off medication "all of it"  and Play with his grandchildren   This is a telephone encounter between John [name]  and John [name] on 11/11/2022 for Diabetes Self Management Education & Support. The visit was conducted with the patient located at home and [name] at [facility]. The patient's identity was confirmed using their DOB and current address. The patient has consented to being evaluated through a telephone encounter and understands the associated risks / benefits (allows the patient to remain at home, decreasing exposure to coronavirus). I personally spent 40 minutes on medical nutrition therapy discussion.   The following statements were read to the patient and/or legal guardian that are established with the Health Provider.   "The purpose of this phone visit is to provide nutritional health care while limiting exposure to the coronavirus (COVID19).  There is a possibility of technology failure and discussed alternative modes of communication if that failure occurs."   "By engaging in this telephone visit, you consent to the provision of healthcare.  Additionally, you authorize for your insurance to be billed for the services provided during this telephone visit."    Patient and/or legal guardian consented to telephone visit: yes  His main concern today is : Foods I can eat and not eat-   Not as hungry since starting Ozempic. He is cooking smaller amounts so that he will eat less Likes to cook, has been cooking since he was 15. Does not use salt or sugar, just switched to olive oil, only baked foods. He watched a webinar on healthy eating for diabetes online and reads labels no rice,  potatoes, pasta, no fried, baked, beef, decreased soda- 2/month- zero, used to 2-3 6 packs/month reg pepsi, small portions  Estimated body mass index is 50.49 kg/m as calculated from the following:   Height as of 09/29/22: 6' (1.829 m).   Weight as of 09/29/22: 372 lb 4.8 oz (168.9 kg). Wt Readings from Last 10 Encounters:  09/29/22 (!) 372 lb 4.8 oz (168.9 kg)  09/29/22 (!) 372 lb 4.8 oz (168.9 kg)  09/12/22 (!) 360 lb (163.3 kg)  04/01/22 (!) 379 lb 8 oz (172.1 kg)  12/03/21 (!) 369 lb 4.8 oz (167.5 kg)  07/29/21 (!) 350 lb (158.8 kg)  02/04/21 (!) 382 lb 4 oz (173.4 kg)  07/23/20 (!) 340 lb (154.2 kg)  02/28/20 (!) 374 lb (169.6 kg)  07/25/19 (!) 383 lb (173.7 kg)   Lab Results  Component Value Date   HGBA1C >14.0 (A) 09/29/2022   HGBA1C 9.7 (A) 04/01/2022   HGBA1C 10.3 (A) 12/03/2021   HGBA1C 8.5 (A) 02/04/2021   HGBA1C 9.6 (A) 02/28/2020    BP Readings from Last 3 Encounters:  09/29/22 (!) 140/91  09/29/22 (!) 140/91  09/12/22 (!) 152/101      Diabetes Self-Management Education - 11/11/22 1300       Visit Information   Visit Type First/Initial      Initial Visit   Diabetes Type Type 2    Date Diagnosed 2022    Are you currently following a meal plan? No    Are you taking your medications as prescribed? Yes  Health Coping   How would you rate your overall health? Fair   high blood pressure, sleep apnea     Psychosocial Assessment   Patient Belief/Attitude about Diabetes Afraid   motivated   What is the hardest part about your diabetes right now, causing you the most concern, or is the most worrisome to you about your diabetes?   Making healty food and beverage choices   stress makes him eat, referral to Christen Butter.   Self-care barriers Lack of transportation;Lack of material resources   ringing in his ear that is getting better   Self-management support Family;Doctor's office;CDE visits   son and mom   Patient Concerns Nutrition/Meal planning;Glycemic  Control    Special Needs None    Preferred Learning Style Auditory;No preference indicated    Learning Readiness Change in progress    How often do you need to have someone help you when you read instructions, pamphlets, or other written materials from your doctor or pharmacy? 1 - Never    What is the last grade level you completed in school? 10   liked math     Pre-Education Assessment   Patient understands the diabetes disease and treatment process. Needs Instruction    Patient understands incorporating nutritional management into lifestyle. Needs Instruction    Patient undertands incorporating physical activity into lifestyle. Needs Instruction   wants PREP referral   Patient understands using medications safely. Comprehends key points   able to name all 3 diabetes medicines and basically what they do   Patient understands monitoring blood glucose, interpreting and using results Needs Review    Patient understands prevention, detection, and treatment of acute complications. Needs Instruction    Patient understands prevention, detection, and treatment of chronic complications. Needs Instruction    Patient understands how to develop strategies to address psychosocial issues. Comprehends key points    Patient understands how to develop strategies to promote health/change behavior. Needs Instruction      Complications   Last HgB A1C per patient/outside source 14 %    How often do you check your blood sugar? 3-4 times/day    Fasting Blood glucose range (mg/dL) --   he does not know   Postprandial Blood glucose range (mg/dL) >161   096E   Number of hypoglycemic episodes per month 1    Can you tell when your blood sugar is low? Yes    What do you do if your blood sugar is low? eats    Number of hyperglycemic episodes ( >200mg /dL): Daily    Can you tell when your blood sugar is high? No    Have you had a dilated eye exam in the past 12 months? --   need to assess at future visit   Have you  had a dental exam in the past 12 months? --   need to assess at future visit   Are you checking your feet? --   need to assess at future visit     Dietary Intake   Breakfast eggs, 2 [place] sausage links, grits-1-2 /month, water    Snack (morning) small portions of apples, grapes, cantaloupe    Lunch skips 3x/ or baked salmon salad, tuna, sardines w/ crackers    Dinner baked croakers x2, salad-tom, cucumbers, carrots, red cabbage. Low fat ranch    Snack (evening) popped  popcorn on the stove    Beverage(s) water, diet soda x2 per month      Activity / Exercise   Activity /  Exercise Type ADL's;Light (walking / raking leaves)    How many days per week do you exercise? 3   when at girlfriends in [place] on weekends   How many minutes per day do you exercise? 30    Total minutes per week of exercise 90      Patient Education   Previous Diabetes Education No    Disease Pathophysiology Explored patient's options for treatment of their diabetes    Healthy Eating Role of diet in the treatment of diabetes and the relationship between the three main macronutrients and blood glucose level;Food label reading, portion sizes and measuring food.;Plate Method    Being Active Role of exercise on diabetes management, blood pressure control and cardiac health.    Monitoring Purpose and frequency of SMBG.    Diabetes Stress and Support Helped patient identify a support system for diabetes management    Lifestyle and Health Coping Lifestyle issues that need to be addressed for better diabetes care      Individualized Goals (developed by patient)   Nutrition --   not discussed today     Outcomes   Expected Outcomes Demonstrated interest in learning. Expect positive outcomes    Future DMSE Other (comment)   3 weeks   Program Status Not Completed             Individualized Plan for Diabetes Self-Management Training:   Learning Objective:  Patient will have a greater understanding of diabetes  self-management. Patient education plan is to attend individual and/or group sessions per assessed needs and concerns.   Plan:   Patient Instructions   Dear Mr. Detienne,   It sounds like you are doing a good job with the food. What you described was a low carbohydrate meal plan.  It is important to eat at least 3 balanced meals (see handout- 1/4 plate protein, 1/4 plate starch, 1/2 plate non starchy vegetables)  to get the nutrients your body needs. Eating 2-3 cups of vegetables, 1-2 servings whole grains like whole wheat bread crackers and cereal, brown rice and small portions of fruits each day can help you get the nutrients you needs and lwoer your blood sugar and blood pressure.   Healthy meats- fish, [place] and chicken, lowfat beef and pork okay a few times a week  Healthy snacks- combine 1 carbohydrate portion with 1 protein- for example- half a peanut butter sandwich, a few sardines and whole grain crackers, a small apple and peanut butter, 1 cup strawberries and 1/2 c greek low sugar yogurt  Portion sizes-see handout      Expected Outcomes:  Demonstrated interest in learning. Expect positive outcomes  Education material provided: My Plate, Snack sheet, and Support group flyer  If problems or questions, patient to contact team via:  Phone and Email  Future DSME appointment: Other (comment) (3 weeks)  [name], RD 11/11/2022 2:54 PM.

## 2022-11-13 ENCOUNTER — Telehealth: Payer: Self-pay

## 2022-11-13 NOTE — Telephone Encounter (Signed)
Call to pt reference PREP referral Explained program. Would prefer a 1030a class on M/W. Next start would be July. Agreeable to starting then.  Confirmed he has my number. Will call him closer to start to schedule intake.

## 2022-11-20 NOTE — Progress Notes (Signed)
Internal Medicine Clinic Attending  Case discussed with Dr. McLendon  At the time of the visit.  We reviewed the resident's history and exam and pertinent patient test results.  I agree with the assessment, diagnosis, and plan of care documented in the resident's note.  

## 2022-11-24 NOTE — Progress Notes (Signed)
   Care Guide Note  11/24/2022 Name: [name] [id number] DOB: August 04, 1965  Referred by: [name], MD Reason for referral : Care Coordination (Outreach to reschedule f/u with pharm d due to being out of office )   John [name] is a 57 y.o. year old male who is a primary care patient of [name], MD. HASSEL TETTERTON was referred to the pharmacist for assistance related to HTN and DM.    Successful contact was made with the patient to discuss pharmacy services including being ready for the pharmacist to call at least 5 minutes before the scheduled appointment time, to have medication bottles and any blood sugar or blood pressure readings ready for review. The patient agreed to meet with the pharmacist via with the pharmacist via telephone visit on (date/time).   12/04/2022 Penne Lash, RMA Care Guide [facility]  [place], [place] [address] Direct Dial: [phone] [email or web address]

## 2022-11-25 ENCOUNTER — Encounter: Payer: Self-pay | Admitting: *Deleted

## 2022-11-25 NOTE — Progress Notes (Signed)
[facility] Quality Team Note  Name: John [name] Date of Birth: 1966/06/22 MRN: [id number] Date: 11/25/2022  [facility] Quality Team has reviewed this patient's chart, please see recommendations below:  [facility] Quality Other; Pt has open gaps for colon screening and diabetic retinopathy eye screening.  Would you be able to address at pt's upcoming appt on 12/04/2022?

## 2022-11-26 ENCOUNTER — Other Ambulatory Visit: Payer: 59 | Admitting: Pharmacist

## 2022-11-27 ENCOUNTER — Telehealth: Payer: Self-pay | Admitting: *Deleted

## 2022-11-27 DIAGNOSIS — I10 Essential (primary) hypertension: Secondary | ICD-10-CM

## 2022-12-02 ENCOUNTER — Ambulatory Visit (INDEPENDENT_AMBULATORY_CARE_PROVIDER_SITE_OTHER): Payer: 59 | Admitting: Dietician

## 2022-12-02 DIAGNOSIS — E118 Type 2 diabetes mellitus with unspecified complications: Secondary | ICD-10-CM | POA: Diagnosis not present

## 2022-12-02 NOTE — Patient Instructions (Addendum)
[name],   Thank you for your visit today! It sounds like you are doing a great job caring for your diabetes and your health!  Our pharmacy team says you can call your insurance ( number on back of card) to let them know what happened to your LOSARTAN medicine, and they should be able to ok the pharmacy to do an early fill    Here are some things you said that you want to work on:   1- buy a scale so you can weigh yourself. Be sure it goes above 300 pounds.   2- Eat something with PROTEIN and vegetables midday instead of skipping lunch   Here are some tips for doing well with Ozempic:  Keep in the refrigerator most of the time. (Can stay at room temperature for only 21 days)  Rotate injection sites.  Consider using a smaller plate or bowl. Eat more slowly. Stop eating at the first sign of fullness.: Eat regularly, don't skip meals. Do not rely on hunger as a signal to eat. : Choose and eat healthy foods Eat protein with each meal and snack. Consider eating protein first. Limit high fat,greasy foods..  Constipation: Eat enough fiber. (25-39 grams/day) Eat a variety of vegetables, fruits, whole grains and beans Increase physical activity   I have included handouts about low blood sugar and high blood sugar.    We were unable to schedule you to see a doctor today.  Please call [phone] to schedule an appointment in about 4 weeks- late June after the 19th to get your A1C done.   Call or send me a Mychart message anytime!  Lupita Leash [phone]

## 2022-12-02 NOTE — Progress Notes (Signed)
Diabetes Self-Management Education  Visit Type: Follow-up (#1)  Appt. Start Time: 1515 Appt. End Time: 1615  12/02/2022  [name], identified by name and date of birth, is a 57 y.o. male with a diagnosis of Diabetes:  .   ASSESSMENT  Reports his blood sugar  < 150 fasting ( 125 this am) 175-185/200 sometimes after eating. Has not started using  Dexcom G7, but intends to have his brother help him start using it.   Scared to eat hamburger because it made his blood sugar go high. Ordered a recumbent stationary bike. Still does not have losartan asks if he can take lisinopril. Per pharmacy tech can call his insurance company to give pharmacy the okay to refill.   Thinks he has had at least 3 low blood sugars but did not check blood sugar, he stated that eating helped him to feel better. On retrospect, he states they were days he was more active and since his ozmepic dose was increase   John [name]'s goals are :Decrease weight, Get off medication "all of it"  and Play with his grandchildren   This is a telephone encounter between John [name]  and John [name] on 11/11/2022 for Diabetes Self Management Education & Support. The visit was conducted with the patient located at home and John [name] at [facility]. The patient's identity was confirmed using their DOB and current address. The patient has consented to being evaluated through a telephone encounter and understands the associated risks / benefits (allows the patient to remain at home, decreasing exposure to coronavirus). I personally spent 40 minutes on medical nutrition therapy discussion.   The following statements were read to the patient and/or legal guardian that are established with the Health Provider.   "The purpose of this phone visit is to provide nutritional health care while limiting exposure to the coronavirus (COVID19).  There is a possibility of technology failure and discussed alternative modes of communication if that  failure occurs."   "By engaging in this telephone visit, you consent to the provision of healthcare.  Additionally, you authorize for your insurance to be billed for the services provided during this telephone visit."    Patient and/or legal guardian consented to telephone visit: yes   Diabetes Self-Management Education - 12/02/22 1600       Visit Information   Visit Type Follow-up   #1     Complications   Have you had a dilated eye exam in the past 12 months? No    Have you had a dental exam in the past 12 months? No    Are you checking your feet? No      Patient Education   Previous Diabetes Education Yes (please comment)   here   Disease Pathophysiology Definition of diabetes, type 1 and 2, and the diagnosis of diabetes    Healthy Eating Meal timing in regards to the patients' current diabetes medication.;Role of diet in the treatment of diabetes and the relationship between the three main macronutrients and blood glucose level    Being Active Role of exercise on diabetes management, blood pressure control and cardiac health.    Medications Reviewed patients medication for diabetes, action, purpose, timing of dose and side effects.    Acute complications Taught prevention, symptoms, and  treatment of hypoglycemia - the 15 rule.    Chronic complications Retinopathy and reason for yearly dilated eye exams;Dental care;Assessed and discussed foot care and prevention of foot problems  Individualized Goals (developed by patient)   Nutrition Follow meal plan discussed      Patient Self-Evaluation of Goals - Patient rates self as meeting previously set goals (% of time)   Nutrition 50 - 75 % (half of the time)   he rated himself a "7/10", wants to eat less fruit and eat something midday with protein     Post-Education Assessment   Patient understands the diabetes disease and treatment process. Comprehends key points    Patient understands incorporating nutritional management into  lifestyle. Comprehends key points    Patient undertands incorporating physical activity into lifestyle. Comprehends key points    Patient understands monitoring blood glucose, interpreting and using results Comprehends key points    Patient understands prevention, detection, and treatment of acute complications. Comprehends key points    Patient understands prevention, detection, and treatment of chronic complications. Comprehends key points    Patient understands how to develop strategies to address psychosocial issues. Comprehends key points    Patient understands how to develop strategies to promote health/change behavior. Comprehends key points      Outcomes   Expected Outcomes Demonstrated interest in learning. Expect positive outcomes    Future DMSE 4-6 wks    Program Status Not Completed   need to go over high blood sugar and sick days     Subsequent Visit   Since your last visit have you continued or begun to take your medications as prescribed? Yes   except losartan   Since your last visit have you had your blood pressure checked? No    Since your last visit have you experienced any weight changes? --   he has not weighed since March 19, he feels like he has lost weight   Since your last visit, are you checking your blood glucose at least once a day? Yes             Individualized Plan for Diabetes Self-Management Training:   Learning Objective:  Patient will have a greater understanding of diabetes self-management. Patient education plan is to attend individual and/or group sessions per assessed needs and concerns.   Plan:   Patient Instructions  John [name],   Thank you for your visit today! It sounds like you are doing a great job caring for your diabetes and your health!  Our pharmacy team says you can call your insurance to let them know what happened to your LOSARTAN medicine, and they should be able to ok the pharmacy to do an early fill    You said that you were  going to buy a scale so you can weigh yourself. Be sure it goes above 300 pounds.   Yo said you want to eat something with PROTEIN midday instead of skipping lunch  Here are some tips for doing well with Ozempic:  Keep in the refrigerator most of the time. (Can stay at room temperature for only 21 days)  Rotate injection sites.  Food Portions: Eat smaller portions than usual. Consider using a smaller plate or bowl. Eat more slowly. Stop eating at the first sign of fullness.   Meal Timing: Eat regularly, don't skip meals. Do not rely on hunger as a signal to eat.  Nutrient Quality: Choose and eat healthy foods Eat protein with each meal and snack. Consider eating protein first. Limit high fat,greasy foods. Limit spicy foods.  Decrease Nausea, Vomiting: On injection day, have largest meal before taking the medication. Eat small, frequent low fat meals Drink enough  fluids, at leas 64 ounces/day. Best if not carbonated and do not contain caffeine  Constipation: Assure adequate fiber. (25-39 grams/day) Eat a variety of vegetables, fruits, whole grains and beans Increase physical activity   Expected Outcomes:  Demonstrated interest in learning. Expect positive outcomes  Education material provided: Diabetes Resources  If problems or questions, patient to contact team via:  Phone  Future DSME appointment: 4-6 wks John [name], RD 11/11/2022 2:54 PM.

## 2022-12-03 ENCOUNTER — Telehealth: Payer: Self-pay | Admitting: Dietician

## 2022-12-03 NOTE — Telephone Encounter (Signed)
Appears patient was having side effects of lightheadedness with lisinopril-HCTZ combination. Would reccommended he pick up losartan and restart this. Does he need a new prescription?

## 2022-12-03 NOTE — Telephone Encounter (Signed)
Informed patient per pharmacy technician recommendation to call his insurance company and tell them his mother threw his losartan away (90 days worth) and ask them if they can authorize a refill so his pharmacy can fill it. He verbalized understanding asking if he can take the lisinopril instead. I told him I would route that question to his doctor's team.

## 2022-12-04 ENCOUNTER — Other Ambulatory Visit: Payer: 59 | Admitting: Pharmacist

## 2022-12-04 ENCOUNTER — Other Ambulatory Visit: Payer: Self-pay | Admitting: Student

## 2022-12-04 ENCOUNTER — Other Ambulatory Visit: Payer: Self-pay

## 2022-12-04 DIAGNOSIS — E118 Type 2 diabetes mellitus with unspecified complications: Secondary | ICD-10-CM

## 2022-12-04 DIAGNOSIS — I10 Essential (primary) hypertension: Secondary | ICD-10-CM

## 2022-12-04 MED ORDER — BD PEN NEEDLE NANO 2ND GEN 32G X 4 MM MISC: 2 refills | Status: DC

## 2022-12-04 MED ORDER — AMLODIPINE BESYLATE 5 MG PO TABS
5.0000 mg | ORAL_TABLET | Freq: Every day | ORAL | 2 refills | Status: DC
Start: 2022-12-04 — End: 2023-08-26

## 2022-12-04 MED ORDER — LANTUS SOLOSTAR 100 UNIT/ML ~~LOC~~ SOPN
30.0000 [IU] | PEN_INJECTOR | Freq: Every day | SUBCUTANEOUS | 0 refills | Status: DC
Start: 2022-12-04 — End: 2022-12-30

## 2022-12-04 NOTE — Progress Notes (Signed)
12/04/2022 Name: John [name] MRN: [id number] DOB: 10-25-1965  Chief Complaint  Patient presents with   Medication Management   Diabetes   Hypertension   Hyperlipidemia    John [name] is a 57 y.o. year old male who presented for a telephone visit.   They were referred to the pharmacist by a quality report for assistance in managing diabetes, hypertension, hyperlipidemia, and complex medication management.    Subjective:  Care Team: Primary Care Provider: [name], MD ; Next Scheduled Visit: Dr. Sloan Leiter 6/10  Medication Access/Adherence  Current Pharmacy:  [id number] - [place] - [address] [place] [address] Phone: [phone] Fax: [phone]  [id number] [place], [place] - [address] AT [facility] [address] [facility] [address] [place] [place] [address] Phone: [phone] Fax: [phone]   Patient reports affordability concerns with their medications: No  Patient reports access/transportation concerns to their pharmacy: Yes  Patient reports adherence concerns with their medications:  No    Notes his mother accidentally threw out a few of his medications a few weeks ago. Has not been able to refill losartan on insurance yet.   Diabetes:  Current medications: Ozempic 2 mg weekly, Lantus 30 units daily, Jardiance 10 mg daily Prior therapy: metformin XR 500 mg daily - GI side effects  Current glucose readings: reports one reading of 135, improved from previously  Has DexCom, is going to have his brother help him apply.    Patient denies hypoglycemic s/sx including dizziness, shakiness, sweating.  Does request refill on Lantus, pen needles.   Hypertension:  Current medications: losartan 25 mg daily, amlodipine 5 mg daily  Has been without losartan several weeks, will not go through on insurance until next month. He notes he has been taking amlodipine,  but fill history indicates it was last filled in December  Hyperlipidemia/ASCVD Risk Reduction  Current lipid lowering medications: rosuvastatin 20 mg daily    Objective:  Lab Results  Component Value Date   HGBA1C >14.0 (A) 09/29/2022    Lab Results  Component Value Date   CREATININE 0.90 12/03/2021   BUN 13 12/03/2021   NA 136 12/03/2021   K 4.3 12/03/2021   CL 97 12/03/2021   CO2 23 12/03/2021    Lab Results  Component Value Date   CHOL 157 04/01/2022   HDL 42 04/01/2022   LDLCALC 102 (H) 04/01/2022   TRIG 67 04/01/2022   CHOLHDL 3.7 04/01/2022    Medications Reviewed Today     Reviewed by Alden Hipp, RPH-CPP (Pharmacist) on 12/04/22 at 0908  Med List Status: <None>   Medication Order Taking? Sig Documenting Provider Last Dose Status Informant  Accu-Chek Softclix Lancets lancets [id number]  Use 1 to 2 times a day [name], MD  Active   amLODipine (NORVASC) 5 MG tablet [id number] Yes Take 1 tablet (5 mg total) by mouth at bedtime. Ellison Carwin, MD Taking Active   BD PEN NEEDLE NANO 2ND GEN 32G X 4 MM MISC [id number]  Use needle with insulin pen to inject your insulin. [name], MD  Active   Blood Glucose Monitoring Suppl (ACCU-CHEK GUIDE) w/Device KIT [id number]  Use to check blood sugars 1-2 times daily. [name], MD  Active   cetirizine [facility] ALLERGY) 10 MG tablet [id number]  Take 1 tablet (10 mg total) by mouth daily. Junie Spencer, FNP  Active   Continuous Glucose Receiver (  DEXCOM G7 RECEIVER) DEVI [id number] Yes Use for continuous glucose monitoring. [name], MD Taking Active   Continuous Glucose Sensor [facility] G7 SENSOR) [place] [id number] Yes Use for continuous glucose monitoring. [name], MD Taking Active   empagliflozin (JARDIANCE) 10 MG TABS tablet [id number] Yes Take 1 tablet (10 mg total) by mouth daily before breakfast. Crissie Sickles, MD Taking Active   fluticasone [facility]) 50 MCG/ACT nasal spray [id number]  Place 2  sprays into both nostrils daily. Jannifer Rodney A, FNP  Active   gabapentin (NEURONTIN) 100 MG capsule [id number]  Take 1 capsule (100 mg total) by mouth 3 (three) times daily. Crissie Sickles, MD  Active   glucose blood test strip [id number]  Use to test 1 to 2 times a day [name], MD  Active   LANTUS SOLOSTAR 100 UNIT/ML Solostar Pen [id number] Yes Inject 30 Units into the skin at bedtime. [name], MD Taking Active   losartan (COZAAR) 25 MG tablet [id number] No Take 1 tablet (25 mg total) by mouth daily.  Patient not taking: Reported on 12/04/2022   [name], MD Not Taking Active   rosuvastatin (CRESTOR) 20 MG tablet [id number] Yes Take 1 tablet (20 mg total) by mouth daily. [name], MD Taking Active   Semaglutide, 2 MG/DOSE, (OZEMPIC, 2 MG/DOSE,) 8 MG/3ML SOPN [id number] Yes Inject 2 mg into the skin once a week. [name], MD Taking Active               Assessment/Plan:   Diabetes: - Currently uncontrolled but improved. Denies intolerability issues with Ozempic. - Recommend to continue current regimen. Follow up with [facility] team as scheduled. Consider dose increase of Jardiance to allow for reduction in Lantus.  - Will collaborate with [facility] team to refill Lantus + pen needles - Recommend to check glucose twice daily, fasting and 2 hour post prandial  Hypertension: - Currently uncontrolled due to lack of access - Discussed with [facility]. Cash price is $9; Advertising account planner at [employer] [employer] is $5, but patient does not have transportation. He is amenable to filling 30 day cash price at [facility] until his insurance will allow fill next month. Coordinated this with [facility], including requesting refill of amlodipine on patient's behalf.   Hyperlipidemia/ASCVD Risk Reduction: - Currently uncontrolled but anticipate improvement given reported adherence - Recommend to continue current regimen   Follow Up Plan: PCP in ~2 weeks, pharmacy in ~ 6 weeks  Catie Eppie Gibson,  PharmD, BCACP, CPP [facility] [phone]

## 2022-12-04 NOTE — Patient Instructions (Addendum)
Juancarlos,  It was great talking to you today!  Continue Ozempic 2 mg weekly, Lantus 30 units daily, and Jardiance 10 mg daily for blood sugars.   Continue amlodipine 5 mg daily and losartan 25 mg daily for blood pressure.   Continue rosuvastatin 20 mg daily for cholesterol.   You can put all of these pills together in your pill box once daily.   Check your blood sugars twice daily:  1) Fasting, first thing in the morning before breakfast and  2) 2 hours after your largest meal.   For a goal A1c of less than 7%, goal fasting readings are less than 130 and goal 2 hour after meal readings are less than 180.    Take care!  Catie Eppie Gibson, PharmD, BCACP, CPP [facility] [phone]

## 2022-12-04 NOTE — Progress Notes (Signed)
Received message from pharmacist that patient was in need of amlodipine, Lantus, and pen needles prior to seeing Dr. Sloan Leiter on 6/10.

## 2022-12-12 DIAGNOSIS — G4733 Obstructive sleep apnea (adult) (pediatric): Secondary | ICD-10-CM | POA: Diagnosis not present

## 2022-12-13 ENCOUNTER — Telehealth: Payer: 59 | Admitting: Physician Assistant

## 2022-12-13 ENCOUNTER — Other Ambulatory Visit: Payer: Self-pay | Admitting: Internal Medicine

## 2022-12-13 DIAGNOSIS — J069 Acute upper respiratory infection, unspecified: Secondary | ICD-10-CM | POA: Diagnosis not present

## 2022-12-13 MED ORDER — BENZONATATE 100 MG PO CAPS
100.0000 mg | ORAL_CAPSULE | Freq: Three times a day (TID) | ORAL | 0 refills | Status: DC | PRN
Start: 2022-12-13 — End: 2023-02-01

## 2022-12-13 MED ORDER — FLUTICASONE PROPIONATE 50 MCG/ACT NA SUSP
2.0000 | Freq: Every day | NASAL | 6 refills | Status: DC
Start: 2022-12-13 — End: 2023-08-26

## 2022-12-13 NOTE — Patient Instructions (Addendum)
[name], thank you for joining Tylene Fantasia Ward, PA-C for today's virtual visit.  While this provider is not your primary care provider (PCP), if your PCP is located in our provider database this encounter information will be shared with them immediately following your visit.   A [facility] MyChart account gives you access to today's visit and all your visits, tests, and labs performed at [facility] " click here if you don't have a [facility] MyChart account or go to [email or web address]  Consent: (Patient) John [name] provided verbal consent for this virtual visit at the beginning of the encounter.  Current Medications:  Current Outpatient Medications:    benzonatate (TESSALON) 100 MG capsule, Take 1 capsule (100 mg total) by mouth 3 (three) times daily as needed., Disp: 20 capsule, Rfl: 0   fluticasone (FLONASE) 50 MCG/ACT nasal spray, Place 2 sprays into both nostrils daily., Disp: 16 g, Rfl: 6   Accu-Chek Softclix Lancets lancets, Use 1 to 2 times a day, Disp: 100 each, Rfl: 0   amLODipine (NORVASC) 5 MG tablet, Take 1 tablet (5 mg total) by mouth at bedtime., Disp: 90 tablet, Rfl: 2   BD PEN NEEDLE NANO 2ND GEN 32G X 4 MM MISC, Use needle with insulin pen to inject your insulin., Disp: 100 each, Rfl: 2   Blood Glucose Monitoring Suppl (ACCU-CHEK GUIDE) w/Device KIT, Use to check blood sugars 1-2 times daily., Disp: 1 kit, Rfl: 0   cetirizine (ZYRTEC ALLERGY) 10 MG tablet, Take 1 tablet (10 mg total) by mouth daily., Disp: 90 tablet, Rfl: 1   Continuous Glucose Receiver (DEXCOM G7 RECEIVER) DEVI, Use for continuous glucose monitoring., Disp: 3 each, Rfl: 3   Continuous Glucose Sensor (DEXCOM G7 SENSOR) MISC, Use for continuous glucose monitoring., Disp: 3 each, Rfl: 3   empagliflozin (JARDIANCE) 10 MG TABS tablet, Take 1 tablet (10 mg total) by mouth daily before breakfast., Disp: 30 tablet, Rfl: 2   gabapentin (NEURONTIN) 100 MG capsule, Take 1 capsule (100 mg  total) by mouth 3 (three) times daily., Disp: 90 capsule, Rfl: 2   glucose blood test strip, Use to test 1 to 2 times a day, Disp: 100 each, Rfl: 0   LANTUS SOLOSTAR 100 UNIT/ML Solostar Pen, Inject 30 Units into the skin at bedtime., Disp: 30 mL, Rfl: 0   losartan (COZAAR) 25 MG tablet, Take 1 tablet (25 mg total) by mouth daily. (Patient not taking: Reported on 12/04/2022), Disp: 90 tablet, Rfl: 3   rosuvastatin (CRESTOR) 20 MG tablet, Take 1 tablet (20 mg total) by mouth daily., Disp: 90 tablet, Rfl: 3   Semaglutide, 2 MG/DOSE, (OZEMPIC, 2 MG/DOSE,) 8 MG/3ML SOPN, Inject 2 mg into the skin once a week., Disp: 3 mL, Rfl: 3   Medications ordered in this encounter:  Meds ordered this encounter  Medications   fluticasone (FLONASE) 50 MCG/ACT nasal spray    Sig: Place 2 sprays into both nostrils daily.    Dispense:  16 g    Refill:  6    Order Specific Question:   Supervising Provider    Answer:   Merrilee Jansky [[id number]]   benzonatate (TESSALON) 100 MG capsule    Sig: Take 1 capsule (100 mg total) by mouth 3 (three) times daily as needed.    Dispense:  20 capsule    Refill:  0    Order Specific Question:   Supervising Provider    Answer:   Merrilee Jansky [id number]     *  If you need refills on other medications prior to your next appointment, please contact your pharmacy*  Follow-Up: Call back or seek an in-person evaluation if the symptoms worsen or if the condition fails to improve as anticipated.  [facility] [phone]  Other Instructions Recommend Flonase and Mucinex.  Take tessalon as needed for cough. Drink plenty of fluids, rest.  Recommend Ibuprofen or Tylenol as needed for headache or fever.  If no improvement follow up for in person evaluation with your PCP or Urgent Care.    If you have been instructed to have an in-person evaluation today at a local Urgent Care facility, please use the link below. It will take you to a list of all of our available  [facility], including address, phone number and hours of operation. Please do not delay care.  [facility]  If you or a family member do not have a primary care provider, use the link below to schedule a visit and establish care. When you choose a [facility] primary care physician or advanced practice provider, you gain a long-term partner in health. Find a Primary Care Provider  Learn more about [facility] in-office and virtual care options: [facility] - Get Care Now

## 2022-12-13 NOTE — Progress Notes (Signed)
Virtual Visit Consent   John [name], you are scheduled for a virtual visit with a [facility] provider today. Just as with appointments in the office, your consent must be obtained to participate. Your consent will be active for this visit and any virtual visit you may have with one of our providers in the next 365 days. If you have a MyChart account, a copy of this consent can be sent to you electronically.  As this is a virtual visit, video technology does not allow for your provider to perform a traditional examination. This may limit your provider's ability to fully assess your condition. If your provider identifies any concerns that need to be evaluated in person or the need to arrange testing (such as labs, EKG, etc.), we will make arrangements to do so. Although advances in technology are sophisticated, we cannot ensure that it will always work on either your end or our end. If the connection with a video visit is poor, the visit may have to be switched to a telephone visit. With either a video or telephone visit, we are not always able to ensure that we have a secure connection.  By engaging in this virtual visit, you consent to the provision of healthcare and authorize for your insurance to be billed (if applicable) for the services provided during this visit. Depending on your insurance coverage, you may receive a charge related to this service.  I need to obtain your verbal consent now. Are you willing to proceed with your visit today? John [name] has provided verbal consent on 12/13/2022 for a virtual visit (video or telephone). Tylene Fantasia Ward, PA-C  Date: 12/13/2022 1:58 PM  Virtual Visit via Video Note   I, Tylene Fantasia Ward, connected with  John [name]  ([id number], October 18, 1967) on 12/13/22 at  1:15 PM EDT by a video-enabled telemedicine application and verified that I am speaking with the correct person using two identifiers.  Location: Patient: Virtual Visit Location Patient:  Home Provider: Virtual Visit Location Provider: Home   I discussed the limitations of evaluation and management by telemedicine and the availability of in person appointments. The patient expressed understanding and agreed to proceed.    History of Present Illness: John [name] is a 57 y.o. who identifies as a male who was assigned male at birth, and is being seen today for congestion, sinus pressure and pain,postnasal drip that started about 2 days ago.  He is currently taking nothing for the sx.  He reports mild coughing.  Denies fever, chills, n/v/d, wheezing, shortness of breath.   HPI: HPI  Problems:  Patient Active Problem List   Diagnosis Date Noted   Viral upper respiratory infection 09/29/2022   Cervical radiculopathy at C8 09/29/2022   Tachycardia 04/01/2022   Healthcare maintenance 12/03/2021   Erectile dysfunction 02/28/2020   Hyperlipidemia associated with type 2 diabetes mellitus (HCC) 10/18/2019   Type 2 diabetes mellitus with unspecified complications (HCC) 07/26/2019   Schizophrenia (HCC) 07/25/2019   Anxiety disorder 07/25/2019   OSA (obstructive sleep apnea) 08/17/2013   Morbid obesity (HCC) 08/17/2013   Hypertension 08/17/2013    Allergies: No Active Allergies Medications:  Current Outpatient Medications:    benzonatate (TESSALON) 100 MG capsule, Take 1 capsule (100 mg total) by mouth 3 (three) times daily as needed., Disp: 20 capsule, Rfl: 0   fluticasone (FLONASE) 50 MCG/ACT nasal spray, Place 2 sprays into both nostrils daily., Disp: 16 g, Rfl: 6   Accu-Chek Softclix Lancets lancets,  Use 1 to 2 times a day, Disp: 100 each, Rfl: 0   amLODipine (NORVASC) 5 MG tablet, Take 1 tablet (5 mg total) by mouth at bedtime., Disp: 90 tablet, Rfl: 2   BD PEN NEEDLE NANO 2ND GEN 32G X 4 MM MISC, Use needle with insulin pen to inject your insulin., Disp: 100 each, Rfl: 2   Blood Glucose Monitoring Suppl (ACCU-CHEK GUIDE) w/Device KIT, Use to check blood sugars 1-2 times  daily., Disp: 1 kit, Rfl: 0   cetirizine (ZYRTEC ALLERGY) 10 MG tablet, Take 1 tablet (10 mg total) by mouth daily., Disp: 90 tablet, Rfl: 1   Continuous Glucose Receiver (DEXCOM G7 RECEIVER) DEVI, Use for continuous glucose monitoring., Disp: 3 each, Rfl: 3   Continuous Glucose Sensor (DEXCOM G7 SENSOR) MISC, Use for continuous glucose monitoring., Disp: 3 each, Rfl: 3   empagliflozin (JARDIANCE) 10 MG TABS tablet, Take 1 tablet (10 mg total) by mouth daily before breakfast., Disp: 30 tablet, Rfl: 2   gabapentin (NEURONTIN) 100 MG capsule, Take 1 capsule (100 mg total) by mouth 3 (three) times daily., Disp: 90 capsule, Rfl: 2   glucose blood test strip, Use to test 1 to 2 times a day, Disp: 100 each, Rfl: 0   LANTUS SOLOSTAR 100 UNIT/ML Solostar Pen, Inject 30 Units into the skin at bedtime., Disp: 30 mL, Rfl: 0   losartan (COZAAR) 25 MG tablet, Take 1 tablet (25 mg total) by mouth daily. (Patient not taking: Reported on 12/04/2022), Disp: 90 tablet, Rfl: 3   rosuvastatin (CRESTOR) 20 MG tablet, Take 1 tablet (20 mg total) by mouth daily., Disp: 90 tablet, Rfl: 3   Semaglutide, 2 MG/DOSE, (OZEMPIC, 2 MG/DOSE,) 8 MG/3ML SOPN, Inject 2 mg into the skin once a week., Disp: 3 mL, Rfl: 3  Observations/Objective: Patient is well-developed, well-nourished in no acute distress.  Resting comfortably at home.  Head is normocephalic, atraumatic.  No labored breathing.  Speech is clear and coherent with logical content.  Patient is alert and oriented at baseline.    Assessment and Plan: 1. Viral upper respiratory tract infection - fluticasone (FLONASE) 50 MCG/ACT nasal spray; Place 2 sprays into both nostrils daily.  Dispense: 16 g; Refill: 6 - benzonatate (TESSALON) 100 MG capsule; Take 1 capsule (100 mg total) by mouth 3 (three) times daily as needed.  Dispense: 20 capsule; Refill: 0  Supportive care discussed.   Follow Up Instructions: I discussed the assessment and treatment plan with the  patient. The patient was provided an opportunity to ask questions and all were answered. The patient agreed with the plan and demonstrated an understanding of the instructions.  A copy of instructions were sent to the patient via MyChart unless otherwise noted below.     The patient was advised to call back or seek an in-person evaluation if the symptoms worsen or if the condition fails to improve as anticipated.  Time:  I spent 10 minutes with the patient via telehealth technology discussing the above problems/concerns.    Tylene Fantasia Ward, PA-C

## 2022-12-14 MED ORDER — ACCU-CHEK SOFTCLIX LANCETS MISC
0 refills | Status: DC
  Filled 2022-12-14: qty 100, 50d supply, fill #0

## 2022-12-14 MED ORDER — ACCU-CHEK GUIDE VI STRP
ORAL_STRIP | 0 refills | Status: DC
  Filled 2022-12-14: qty 100, 50d supply, fill #0

## 2022-12-15 ENCOUNTER — Other Ambulatory Visit: Payer: Self-pay

## 2022-12-15 ENCOUNTER — Other Ambulatory Visit (HOSPITAL_COMMUNITY): Payer: Self-pay

## 2022-12-21 ENCOUNTER — Encounter: Payer: 59 | Admitting: Internal Medicine

## 2022-12-21 ENCOUNTER — Encounter: Payer: 59 | Admitting: Dietician

## 2022-12-21 NOTE — Progress Notes (Deleted)
DM: Saw [name] 12/03/22 Dexcom Empagliflozin 10 mg, semaglutide 2 mg, lantus 30 units Rosuvastatin 20 mg  HTN Amlodipine 5 mg, losartan 25 mg  HLD Lab Results  Component Value Date   CHOL 157 04/01/2022   HDL 42 04/01/2022   LDLCALC 102 (H) 04/01/2022   TRIG 67 04/01/2022   CHOLHDL 3.7 04/01/2022

## 2022-12-22 ENCOUNTER — Telehealth: Payer: 59 | Admitting: Physician Assistant

## 2022-12-22 ENCOUNTER — Ambulatory Visit (INDEPENDENT_AMBULATORY_CARE_PROVIDER_SITE_OTHER): Payer: 59 | Admitting: Licensed Clinical Social Worker

## 2022-12-22 DIAGNOSIS — L739 Follicular disorder, unspecified: Secondary | ICD-10-CM

## 2022-12-22 DIAGNOSIS — F419 Anxiety disorder, unspecified: Secondary | ICD-10-CM

## 2022-12-22 MED ORDER — CLINDAMYCIN HCL 300 MG PO CAPS
300.0000 mg | ORAL_CAPSULE | Freq: Three times a day (TID) | ORAL | 0 refills | Status: DC
Start: 2022-12-22 — End: 2023-02-01

## 2022-12-22 NOTE — BH Specialist Note (Signed)
Integrated Behavioral Health via Telemedicine Visit  12/22/2022 [name] [id number]  Number of Integrated Behavioral Health Clinician visits: 1- Initial Visit  Session Start time: 1500   Session End time: 1530  Total time in minutes: 30   Referring Provider: Carlynn Purl, DO Patient/Family location: Home [facility] Provider location: Office All persons participating in visit: Patient and [facility] Types of Service: Introduction only  I connected with [name]  via  Telephone or Video Enabled Telemedicine Application  (Video is Caregility application) and verified that I am speaking with the correct person using two identifiers. Discussed confidentiality: Yes   I discussed the limitations of telemedicine and the availability of in person appointments.  Discussed there is a possibility of technology failure and discussed alternative modes of communication if that failure occurs.  I discussed that engaging in this telemedicine visit, they consent to the provision of behavioral healthcare and the services will be billed under their insurance.  Patient and/or legal guardian expressed understanding and consented to Telemedicine visit: Yes   Presenting Concerns: Patient and/or family reports the following symptoms/concerns: Depression/ Anxiety Duration of problem: More than 3 years; Severity of problem: moderate  Patient and/or Family's Strengths/Protective Factors: Sense of purpose  Goals Addressed: Patient will:  Reduce symptoms of: anxiety and depression    Progress towards Goals: Ongoing  Interventions: Interventions utilized:  Supportive Counseling Standardized Assessments completed: PHQ-SADS     09/29/2022    3:37 PM 09/29/2022    3:28 PM 09/29/2022    3:26 PM  PHQ-SADS Last 3 Score only  Total GAD-7 Score  16   PHQ Adolescent Score 12  12       Assessment: BHC introduced self to patient on today. Patient understands [facility] role and benefits of therapy and social  work resources.   Patient discussed his Bull Dog names John [name]. Patient stated John [name] is patients emotional support animal.  Patient prefers telehealth appointments.  Patient had questions regarding [facility]. Patient will contact agency to inquire.  Patient may benefit from ongoing therapy.  Plan: Follow up with behavioral health clinician on : Patient will schedule with [facility] if needed.   I discussed the assessment and treatment plan with the patient and/or parent/guardian. They were provided an opportunity to ask questions and all were answered. They agreed with the plan and demonstrated an understanding of the instructions.   They were advised to call back or seek an in-person evaluation if the symptoms worsen or if the condition fails to improve as anticipated.  Christen Butter, MSW, LCSW-A She/Her Behavioral Health Clinician [facility]  Internal Medicine Center Direct Dial:[phone]  Fax [phone] Main Office Phone: [phone] [address] [place], [place] [address] Website: [facility] Internal Medicine [facility]  [place], [place]  [place]

## 2022-12-22 NOTE — Patient Instructions (Signed)
John [name], thank you for joining [name], PA-C for today's virtual visit.  While this provider is not your primary care provider (PCP), if your PCP is located in our provider database this encounter information will be shared with them immediately following your visit.   A [facility] MyChart account gives you access to today's visit and all your visits, tests, and labs performed at [facility] " click here if you don't have a [facility] MyChart account or go to [email or web address]  Consent: (Patient) John [name] provided verbal consent for this virtual visit at the beginning of the encounter.  Current Medications:  Current Outpatient Medications:    clindamycin (CLEOCIN) 300 MG capsule, Take 1 capsule (300 mg total) by mouth 3 (three) times daily., Disp: 30 capsule, Rfl: 0   Accu-Chek Softclix Lancets lancets, Use 1 to 2 times a day, Disp: 100 each, Rfl: 0   amLODipine (NORVASC) 5 MG tablet, Take 1 tablet (5 mg total) by mouth at bedtime., Disp: 90 tablet, Rfl: 2   BD PEN NEEDLE NANO 2ND GEN 32G X 4 MM MISC, Use needle with insulin pen to inject your insulin., Disp: 100 each, Rfl: 2   benzonatate (TESSALON) 100 MG capsule, Take 1 capsule (100 mg total) by mouth 3 (three) times daily as needed., Disp: 20 capsule, Rfl: 0   Blood Glucose Monitoring Suppl (ACCU-CHEK GUIDE) w/Device KIT, Use to check blood sugars 1-2 times daily., Disp: 1 kit, Rfl: 0   cetirizine (ZYRTEC ALLERGY) 10 MG tablet, Take 1 tablet (10 mg total) by mouth daily., Disp: 90 tablet, Rfl: 1   Continuous Glucose Receiver (DEXCOM G7 RECEIVER) DEVI, Use for continuous glucose monitoring., Disp: 3 each, Rfl: 3   Continuous Glucose Sensor (DEXCOM G7 SENSOR) MISC, Use for continuous glucose monitoring., Disp: 3 each, Rfl: 3   empagliflozin (JARDIANCE) 10 MG TABS tablet, Take 1 tablet (10 mg total) by mouth daily before breakfast., Disp: 30 tablet, Rfl: 2   fluticasone (FLONASE) 50 MCG/ACT nasal  spray, Place 2 sprays into both nostrils daily., Disp: 16 g, Rfl: 6   gabapentin (NEURONTIN) 100 MG capsule, Take 1 capsule (100 mg total) by mouth 3 (three) times daily., Disp: 90 capsule, Rfl: 2   glucose blood (ACCU-CHEK GUIDE) test strip, Use to test 1 to 2 times a day, Disp: 100 each, Rfl: 0   LANTUS SOLOSTAR 100 UNIT/ML Solostar Pen, Inject 30 Units into the skin at bedtime., Disp: 30 mL, Rfl: 0   losartan (COZAAR) 25 MG tablet, Take 1 tablet (25 mg total) by mouth daily. (Patient not taking: Reported on 12/04/2022), Disp: 90 tablet, Rfl: 3   rosuvastatin (CRESTOR) 20 MG tablet, Take 1 tablet (20 mg total) by mouth daily., Disp: 90 tablet, Rfl: 3   Semaglutide, 2 MG/DOSE, (OZEMPIC, 2 MG/DOSE,) 8 MG/3ML SOPN, Inject 2 mg into the skin once a week., Disp: 3 mL, Rfl: 3   Medications ordered in this encounter:  Meds ordered this encounter  Medications   clindamycin (CLEOCIN) 300 MG capsule    Sig: Take 1 capsule (300 mg total) by mouth 3 (three) times daily.    Dispense:  30 capsule    Refill:  0    Order Specific Question:   Supervising Provider    Answer:   Merrilee Jansky [id number]     *If you need refills on other medications prior to your next appointment, please contact your pharmacy*  Follow-Up: Call back or seek an in-person evaluation if the  symptoms worsen or if the condition fails to improve as anticipated.  [facility] [phone]  Other Instructions Folliculitis  Folliculitis occurs when hair follicles become inflamed. A hair follicle is a tiny opening in your skin where your hair grows from. This condition often occurs on the scalp, thighs, legs, back, and buttocks but can happen anywhere on the body. What are the causes? A common cause of this condition is an infection from bacteria. The type of folliculitis caused by bacteria can last a long time or go away and come back. The bacteria can live anywhere on your skin. They are often found in the  nostrils. Other causes may include: An infection from a fungus. An infection from a virus. Your skin touching some chemicals, such as oils and tars. Shaving or waxing. Greasy ointments or creams put on the skin. What increases the risk? You are more likely to develop this condition if: Your body has a weak disease-fighting system (immune system). You have diabetes. You are obese. What are the signs or symptoms? Symptoms of this condition include: Redness. Soreness. Swelling. Itching. Small white or yellow, itchy spots filled with pus (pustules) that appear over a red area. If the infection goes deep into the follicle, these may turn into a boil (furuncle). A group of boils (carbuncle). These tend to form in hairy, sweaty areas of the body. How is this diagnosed? This condition is diagnosed with a skin exam. Your health care provider may take a sample of one of the pustules or boils to test in a lab. How is this treated? This condition may be treated by: Putting a warm, wet cloth (warm compress) on the affected areas. Taking antibiotics or applying them to the skin. Applying or bathing with a solution that kills germs (antiseptic). Taking an over-the-counter medicine. This can help with itching. Having a procedure to drain pustules or boils. This may be done if a pustule or boil contains a lot of pus or fluid. Having laser hair removal. This may be done when the condition lasts for a long time. Follow these instructions at home: Managing pain and swelling  If directed, apply heat to the affected area as often as told by your health care provider. Use the heat source that your health care provider recommends, such as a moist heat pack or a heating pad. Place a towel between your skin and the heat source. Leave the heat on for 20-30 minutes. If your skin turns bright red, remove the heat right away to prevent burns. The risk of burns is higher if you cannot feel pain, heat, or  cold. General instructions Take over-the-counter and prescription medicines only as told by your health care provider. If you were prescribed antibiotics, take or apply them as told by your health care provider. Do not stop using the antibiotic [name] if you start to feel better. Check your irritated area every day for signs of infection. Check for: More redness, swelling, or pain. Fluid or blood. Warmth. Pus or a bad smell. Do not shave irritated skin. Keep all follow-up visits. Your health care provider will check if the treatments are helping. Contact a health care provider if: You have a fever. You have any signs of infection. Red streaks are spreading from the affected area. This information is not intended to replace advice given to you by your health care provider. Make sure you discuss any questions you have with your health care provider. Document Revised: 12/02/2021 Document Reviewed: 12/02/2021  Elsevier Patient Education  2024 Elsevier [employer]    If you have been instructed to have an in-person evaluation today at a local Urgent Care facility, please use the link below. It will take you to a list of all of our available [facility], including address, phone number and hours of operation. Please do not delay care.  [facility]  If you or a family member do not have a primary care provider, use the link below to schedule a visit and establish care. When you choose a [facility] primary care physician or advanced practice provider, you gain a long-term partner in health. Find a Primary Care Provider  Learn more about [facility]'s in-office and virtual care options: [facility] - Get Care Now

## 2022-12-22 NOTE — Progress Notes (Signed)
Virtual Visit Consent   John [name], you are scheduled for a virtual visit with a [facility] provider today. Just as with appointments in the office, your consent must be obtained to participate. Your consent will be active for this visit and any virtual visit you may have with one of our providers in the next 365 days. If you have a MyChart account, a copy of this consent can be sent to you electronically.  As this is a virtual visit, video technology does not allow for your provider to perform a traditional examination. This may limit your provider's ability to fully assess your condition. If your provider identifies any concerns that need to be evaluated in person or the need to arrange testing (such as labs, EKG, etc.), we will make arrangements to do so. Although advances in technology are sophisticated, we cannot ensure that it will always work on either your end or our end. If the connection with a video visit is poor, the visit may have to be switched to a telephone visit. With either a video or telephone visit, we are not always able to ensure that we have a secure connection.  By engaging in this virtual visit, you consent to the provision of healthcare and authorize for your insurance to be billed (if applicable) for the services provided during this visit. Depending on your insurance coverage, you may receive a charge related to this service.  I need to obtain your verbal consent now. Are you willing to proceed with your visit today? JAZIEL BENNETT has provided verbal consent on 12/22/2022 for a virtual visit (video or telephone). [name], PA-C  Date: 12/22/2022 1:12 PM  Virtual Visit via Video Note   I, [name], connected with  John [name]  ([id number], 1966/05/11) on 12/22/22 at  1:00 PM EDT by a video-enabled telemedicine application and verified that I am speaking with the correct person using two identifiers.  Location: Patient: Virtual Visit Location  Patient: Home Provider: Virtual Visit Location Provider: Home Office   I discussed the limitations of evaluation and management by telemedicine and the availability of in person appointments. The patient expressed understanding and agreed to proceed.    History of Present Illness: John [name] is a 57 y.o. who identifies as a male who was assigned male at birth, and is being seen today for Folliculitis of scalp.  HPI: Rash This is a new problem. The current episode started in the past 7 days (3 days). The problem is unchanged. The affected locations include the scalp. The rash is characterized by redness, swelling, itchiness and burning (tender to touch). He was exposed to nothing. Pertinent negatives include no congestion, cough, fatigue, fever, rhinorrhea or sore throat. Treatments tried: tylenol. The treatment provided no relief.    Reports he has had folliculitis of his scalp in the past and this feels the same. Reports he has used Clindamycin in the past and it resolved the issue.    Problems:  Patient Active Problem List   Diagnosis Date Noted   Viral upper respiratory infection 09/29/2022   Cervical radiculopathy at C8 09/29/2022   Tachycardia 04/01/2022   Healthcare maintenance 12/03/2021   Erectile dysfunction 02/28/2020   Hyperlipidemia associated with type 2 diabetes mellitus (HCC) 10/18/2019   Type 2 diabetes mellitus with unspecified complications (HCC) 07/26/2019   Schizophrenia (HCC) 07/25/2019   Anxiety disorder 07/25/2019   OSA (obstructive sleep apnea) 08/17/2013   Morbid obesity (HCC) 08/17/2013   Hypertension  08/17/2013    Allergies: No Known Allergies Medications:  Current Outpatient Medications:    clindamycin (CLEOCIN) 300 MG capsule, Take 1 capsule (300 mg total) by mouth 3 (three) times daily., Disp: 30 capsule, Rfl: 0   Accu-Chek Softclix Lancets lancets, Use 1 to 2 times a day, Disp: 100 each, Rfl: 0   amLODipine (NORVASC) 5 MG tablet, Take 1 tablet (5  mg total) by mouth at bedtime., Disp: 90 tablet, Rfl: 2   BD PEN NEEDLE NANO 2ND GEN 32G X 4 MM MISC, Use needle with insulin pen to inject your insulin., Disp: 100 each, Rfl: 2   benzonatate (TESSALON) 100 MG capsule, Take 1 capsule (100 mg total) by mouth 3 (three) times daily as needed., Disp: 20 capsule, Rfl: 0   Blood Glucose Monitoring Suppl (ACCU-CHEK GUIDE) w/Device KIT, Use to check blood sugars 1-2 times daily., Disp: 1 kit, Rfl: 0   cetirizine (ZYRTEC ALLERGY) 10 MG tablet, Take 1 tablet (10 mg total) by mouth daily., Disp: 90 tablet, Rfl: 1   Continuous Glucose Receiver (DEXCOM G7 RECEIVER) DEVI, Use for continuous glucose monitoring., Disp: 3 each, Rfl: 3   Continuous Glucose Sensor (DEXCOM G7 SENSOR) MISC, Use for continuous glucose monitoring., Disp: 3 each, Rfl: 3   empagliflozin (JARDIANCE) 10 MG TABS tablet, Take 1 tablet (10 mg total) by mouth daily before breakfast., Disp: 30 tablet, Rfl: 2   fluticasone (FLONASE) 50 MCG/ACT nasal spray, Place 2 sprays into both nostrils daily., Disp: 16 g, Rfl: 6   gabapentin (NEURONTIN) 100 MG capsule, Take 1 capsule (100 mg total) by mouth 3 (three) times daily., Disp: 90 capsule, Rfl: 2   glucose blood (ACCU-CHEK GUIDE) test strip, Use to test 1 to 2 times a day, Disp: 100 each, Rfl: 0   LANTUS SOLOSTAR 100 UNIT/ML Solostar Pen, Inject 30 Units into the skin at bedtime., Disp: 30 mL, Rfl: 0   losartan (COZAAR) 25 MG tablet, Take 1 tablet (25 mg total) by mouth daily. (Patient not taking: Reported on 12/04/2022), Disp: 90 tablet, Rfl: 3   rosuvastatin (CRESTOR) 20 MG tablet, Take 1 tablet (20 mg total) by mouth daily., Disp: 90 tablet, Rfl: 3   Semaglutide, 2 MG/DOSE, (OZEMPIC, 2 MG/DOSE,) 8 MG/3ML SOPN, Inject 2 mg into the skin once a week., Disp: 3 mL, Rfl: 3  Observations/Objective: Patient is well-developed, well-nourished in no acute distress.  Resting comfortably at home.  Head is normocephalic, atraumatic.  No labored breathing.   Speech is clear and coherent with logical content.  Patient is alert and oriented at baseline.    Assessment and Plan: 1. Folliculitis - clindamycin (CLEOCIN) 300 MG capsule; Take 1 capsule (300 mg total) by mouth 3 (three) times daily.  Dispense: 30 capsule; Refill: 0  - Clindamycin prescribed at patient request since previously effective - Keep scalp clean and dry - Discussed since I could not visualize any lesions through video that I could not confirm the diagnosis and there are other scalp lesions that can occur and cause similar issues so if not improving or if worsening he should seek in person evaluation  Follow Up Instructions: I discussed the assessment and treatment plan with the patient. The patient was provided an opportunity to ask questions and all were answered. The patient agreed with the plan and demonstrated an understanding of the instructions.  A copy of instructions were sent to the patient via MyChart unless otherwise noted below.    The patient was advised to call back or  seek an in-person evaluation if the symptoms worsen or if the condition fails to improve as anticipated.  Time:  I spent 10 minutes with the patient via telehealth technology discussing the above problems/concerns.    [name], PA-C

## 2022-12-24 ENCOUNTER — Encounter: Payer: Self-pay | Admitting: Student

## 2022-12-24 ENCOUNTER — Telehealth: Payer: Self-pay | Admitting: *Deleted

## 2022-12-24 ENCOUNTER — Encounter: Payer: 59 | Admitting: Dietician

## 2022-12-24 ENCOUNTER — Ambulatory Visit (INDEPENDENT_AMBULATORY_CARE_PROVIDER_SITE_OTHER): Payer: 59 | Admitting: Student

## 2022-12-24 DIAGNOSIS — E118 Type 2 diabetes mellitus with unspecified complications: Secondary | ICD-10-CM | POA: Diagnosis not present

## 2022-12-24 DIAGNOSIS — J069 Acute upper respiratory infection, unspecified: Secondary | ICD-10-CM

## 2022-12-24 NOTE — Assessment & Plan Note (Signed)
Patient was supposed to be seen in person today but requested telemedicine appointment as he felt dizzy waking up this AM. Patient was called and he reports a 4-5 days history of upper respiratory symptoms that include rhinorrhea, coryza, sinus pressure with intermittent headaches, and, for the past two days, ear fullness without pain, ringing, or drainage. He not been eating or drinking much for the past two days. Has not been doing medications for his symptoms.  No difficulty swallowing, double vision, abnormal eye movements, hearing loss, sense of mition around the room, nausea or vomiting. Denies balance or gait abnormalities  or abnormal movements. No cough or sputum production. No shortness of breath or chest pain.  Patient has been taking all medicaitons, including BP meds and insulin.  Patient was able to speak in full sentences with appropriate response. He was instructed to lay down, sit, and stand without recurrence of symptoms. Suspect upper respiratory infection/ viral rhinosinusitis with blockage of the eustachian tube vs viral OM. Discussed Flonase and DayQuill/NightQuill BP use. If worsening of symptoms or no relief after in the next 5 days, recommended in person evaluation in clinic. - Conservative management with OTC Dayquill/NightQuill and Flonase - Hydration and appropriate food intake -RTC in person in worsening of symptoms

## 2022-12-24 NOTE — Telephone Encounter (Signed)
Call from patient states has Vertigo when he gets up.  When he lies down it goes away. Patient has an appointment this morning with Dr. Sloan Leiter. Will change appointment to TeleHealth as patient has no one to bring him in for an appointment.

## 2022-12-24 NOTE — Progress Notes (Signed)
  [facility] Internal Medicine Residency Telephone Encounter Continuity Care Appointment  HPI:  This telephone encounter was created for [name] on 12/24/2022 for the following purpose/cc viral upper respiratory infection.   Past Medical History:  Past Medical History:  Diagnosis Date   Anxiety disorder    Erectile dysfunction    Hyperlipidemia    Hypertension    Obesity, morbid, BMI 50 or higher (HCC)    Poorly controlled type 2 diabetes mellitus (HCC)    Schizophrenia (HCC)    Sleep apnea      ROS:     Assessment / Plan / Recommendations:  Viral upper respiratory infection  Patient was supposed to be seen in person today but requested telemedicine appointment as he felt dizzy waking up this AM. Patient was called and he reports a 4-5 days history of upper respiratory symptoms that include rhinorrhea, coryza, sinus pressure with intermittent headaches, and, for the past two days, ear fullness without pain, ringing, or drainage. He not been eating or drinking much for the past two days. Has not been doing medications for his symptoms.  No difficulty swallowing, double vision, abnormal eye movements, hearing loss, sense of mition around the room, nausea or vomiting. Denies balance or gait abnormalities  or abnormal movements. No cough or sputum production. No shortness of breath or chest pain.  Patient has been taking all medicaitons, including BP meds and insulin.  Patient was able to speak in full sentences with appropriate response. He was instructed to lay down, sit, and stand without recurrence of symptoms. Suspect upper respiratory infection/ viral rhinosinusitis with blockage of the eustachian tube vs viral OM. Discussed Flonase and DayQuill/NightQuill BP use. If worsening of symptoms or no relief after in the next 5 days, recommended in person evaluation in clinic. - Conservative management with OTC Dayquill/NightQuill and Flonase - Hydration and appropriate food  intake -RTC in person in worsening of symptoms   As always, pt is advised that if symptoms worsen or new symptoms arise, they should go to an urgent care facility or to to ER for further evaluation.   Consent and Medical Decision Making:  Patient discussed with Dr.  Lafonda Mosses This is a telephone encounter between [name] and Morene Crocker ,MD on 12/24/2022 for viral upper respiratory infection. The visit was conducted with the patient located at home and Morene Crocker ,MD at Home. The patient's identity was confirmed using their DOB and current address. The patient has consented to being evaluated through a telephone encounter and understands the associated risks (an examination cannot be done and the patient may need to come in for an appointment) / benefits (allows the patient to remain at home, decreasing exposure to coronavirus). I personally spent 30 minutes on medical discussion.

## 2022-12-25 ENCOUNTER — Other Ambulatory Visit: Payer: Self-pay | Admitting: Internal Medicine

## 2022-12-25 DIAGNOSIS — E118 Type 2 diabetes mellitus with unspecified complications: Secondary | ICD-10-CM

## 2022-12-28 NOTE — Progress Notes (Signed)
Internal Medicine Clinic Attending  Case discussed with Dr. Gomez-Caraballo  At the time of the visit.  We reviewed the resident's history and exam and pertinent patient test results.  I agree with the assessment, diagnosis, and plan of care documented in the resident's note.  

## 2023-01-05 ENCOUNTER — Other Ambulatory Visit: Payer: Self-pay | Admitting: Internal Medicine

## 2023-01-06 ENCOUNTER — Other Ambulatory Visit: Payer: Self-pay

## 2023-01-06 MED ORDER — ACCU-CHEK GUIDE W/DEVICE KIT
PACK | 0 refills | Status: DC
  Filled 2023-01-06: qty 1, 30d supply, fill #0

## 2023-01-07 ENCOUNTER — Telehealth: Payer: Self-pay | Admitting: Dietician

## 2023-01-07 ENCOUNTER — Other Ambulatory Visit: Payer: Self-pay | Admitting: Student

## 2023-01-07 ENCOUNTER — Other Ambulatory Visit: Payer: Self-pay

## 2023-01-07 ENCOUNTER — Other Ambulatory Visit: Payer: Self-pay | Admitting: Internal Medicine

## 2023-01-07 MED ORDER — ACCU-CHEK GUIDE VI STRP
ORAL_STRIP | 0 refills | Status: DC
  Filled 2023-01-07: qty 100, fill #0
  Filled 2023-03-09: qty 100, 50d supply, fill #0

## 2023-01-07 NOTE — Telephone Encounter (Signed)
Calling patient to reschedule missed appointment. No answer l;eft voicemail that I'd reschedule to Monday after he sees the doctor.

## 2023-01-08 ENCOUNTER — Other Ambulatory Visit (HOSPITAL_BASED_OUTPATIENT_CLINIC_OR_DEPARTMENT_OTHER): Payer: Self-pay

## 2023-01-08 ENCOUNTER — Other Ambulatory Visit (HOSPITAL_COMMUNITY): Payer: Self-pay

## 2023-01-09 ENCOUNTER — Other Ambulatory Visit (HOSPITAL_COMMUNITY): Payer: Self-pay

## 2023-01-09 MED ORDER — GABAPENTIN 100 MG PO CAPS
100.0000 mg | ORAL_CAPSULE | Freq: Three times a day (TID) | ORAL | 2 refills | Status: DC
  Filled 2023-01-09: qty 90, 30d supply, fill #0
  Filled 2023-03-09: qty 90, 30d supply, fill #1
  Filled 2023-03-30 – 2023-04-16 (×2): qty 90, 30d supply, fill #2

## 2023-01-11 ENCOUNTER — Encounter: Payer: 59 | Admitting: Dietician

## 2023-01-11 ENCOUNTER — Ambulatory Visit (INDEPENDENT_AMBULATORY_CARE_PROVIDER_SITE_OTHER): Payer: 59 | Admitting: Student

## 2023-01-11 VITALS — BP 163/102 | HR 101 | Ht 72.0 in | Wt 365.3 lb

## 2023-01-11 DIAGNOSIS — E1169 Type 2 diabetes mellitus with other specified complication: Secondary | ICD-10-CM | POA: Diagnosis not present

## 2023-01-11 DIAGNOSIS — I10 Essential (primary) hypertension: Secondary | ICD-10-CM

## 2023-01-11 DIAGNOSIS — Z0000 Encounter for general adult medical examination without abnormal findings: Secondary | ICD-10-CM

## 2023-01-11 DIAGNOSIS — M25512 Pain in left shoulder: Secondary | ICD-10-CM | POA: Diagnosis not present

## 2023-01-11 DIAGNOSIS — M25511 Pain in right shoulder: Secondary | ICD-10-CM

## 2023-01-11 DIAGNOSIS — E785 Hyperlipidemia, unspecified: Secondary | ICD-10-CM | POA: Diagnosis not present

## 2023-01-11 DIAGNOSIS — E118 Type 2 diabetes mellitus with unspecified complications: Secondary | ICD-10-CM

## 2023-01-11 DIAGNOSIS — Z7985 Long-term (current) use of injectable non-insulin antidiabetic drugs: Secondary | ICD-10-CM

## 2023-01-11 DIAGNOSIS — H8110 Benign paroxysmal vertigo, unspecified ear: Secondary | ICD-10-CM | POA: Diagnosis not present

## 2023-01-11 DIAGNOSIS — Z6841 Body Mass Index (BMI) 40.0 and over, adult: Secondary | ICD-10-CM

## 2023-01-11 DIAGNOSIS — Z794 Long term (current) use of insulin: Secondary | ICD-10-CM | POA: Diagnosis not present

## 2023-01-11 LAB — POCT GLYCOSYLATED HEMOGLOBIN (HGB A1C): Hemoglobin A1C: 8.1 % — AB (ref 4.0–5.6)

## 2023-01-11 LAB — GLUCOSE, CAPILLARY: Glucose-Capillary: 154 mg/dL — ABNORMAL HIGH (ref 70–99)

## 2023-01-11 MED ORDER — ACCU-CHEK SOFTCLIX LANCETS MISC: 0 refills | Status: DC

## 2023-01-11 NOTE — Assessment & Plan Note (Addendum)
Patient has a history of hyperlipidemia.  -Lipid panel collected today, results pending -Consider increasing rosuvastatin if LDL above target level.

## 2023-01-11 NOTE — Assessment & Plan Note (Addendum)
Patient has a history of poorly controlled type 2 diabetes.  He recently began using a continuous glucose monitor which helped get their blood sugars under control.  HbA1c today 8.1%, down from 14.0% last visit.  CGM reviewed today, with great improvement in his glycemic control.  Denies numbness of the hands or feet, good pedal pulses today. - Continue semaglutide and insulin Lantus. - Begin Jardiance, ordered last visit picked up but never taken. - He will meet with the diabetes counselor at next visit. - Patient says he was unable to provide urine sample today. We will collect a albumin to creatinine ratio at next visit. -Ophthalmology referral for diabetic eye exam -Diabetic foot exam next visit.

## 2023-01-11 NOTE — Patient Instructions (Addendum)
Thank you, Mr.Benecio D Millhouse for allowing [place] to provide your care today.   Diabetes:   I have ordered the following labs for you:  Lab Orders         BMP8+Anion Gap         Lipid Profile         POC Hbg A1C       Tests ordered today:  Referrals ordered today:   Referral Orders         Ambulatory referral to Orthopedic Surgery         Ambulatory referral to Ophthalmology       I have ordered the following medication/changed the following medications:   Stop the following medications: Medications Discontinued During This Encounter  Medication Reason   Accu-Chek Softclix Lancets lancets Reorder     Start the following medications: Meds ordered this encounter  Medications   Accu-Chek Softclix Lancets lancets    Sig: Use 1 to 2 times a day    Dispense:  100 each    Refill:  0     Follow up:  3 weeks    Remember:   We look forward to seeing you next time. Please call our clinic at [phone] if you have any questions or concerns. The best time to call is Monday-Friday from 9am-4pm, but there is someone available 24/7. If after hours or the weekend, call the main hospital number and ask for the Internal Medicine Resident On-Call. If you need medication refills, please notify your pharmacy one week in advance and they will send [place] a request.   Thank you for trusting me with your care. Wishing you the best!  Lovie Macadamia MD [facility]

## 2023-01-11 NOTE — Assessment & Plan Note (Signed)
Patient BMI 49.54 today, slight weight loss since last visit.  Patient states he has been cleaning up his diet with the help of his continuous glucose monitor.  He says he is cut out sodas, sweets and fried foods.  Plan: -Continue lifestyle modifications -Continue Ozempic -Referral to Lupita Leash for nutritional counseling and diabetic management information at next visit

## 2023-01-11 NOTE — Assessment & Plan Note (Addendum)
Patient describes dizziness and sense of ear fullness.  He says his dizziness is episodic, and last for few minutes at a time.  He first noticed the symptoms beginning about a month ago. He says he had these symptoms in the past, which resolved with therapy exercises for BPPV.  -Patient given instructions for performing BPPV exercises in after visit summary -Patient says he has done exercises from YouTube in the past, counseled patient on trying those exercises again.

## 2023-01-11 NOTE — Assessment & Plan Note (Addendum)
Patient has a history of right sided shoulder and neck pain, he complains today of left-sided neck and shoulder pain.  Says the pain seems like an electric sensation that goes down his neck into his shoulder and upper arm, though he has difficulty characterizing the pain.  He has somewhat recently had a shoulder MRI with his orthopedist. Plan: -Referral to orthopedics for workup of left-sided neck and shoulder pain.

## 2023-01-11 NOTE — Progress Notes (Signed)
Subjective:  CC: Diabetes management follow-up, dizziness, shoulder pain.  HPI:  John [name] is a 57 y.o. male with a past medical history stated below and presents today for follow-up on his diabetes management, as well as shoulder pain and dizziness. Please see problem based assessment and plan for additional details.  Past Medical History:  Diagnosis Date   Anxiety disorder    Erectile dysfunction    Hyperlipidemia    Hypertension    Obesity, morbid, BMI 50 or higher (HCC)    Poorly controlled type 2 diabetes mellitus (HCC)    Schizophrenia (HCC)    Sleep apnea     Current Outpatient Medications on File Prior to Visit  Medication Sig Dispense Refill   amLODipine (NORVASC) 5 MG tablet Take 1 tablet (5 mg total) by mouth at bedtime. 90 tablet 2   BD PEN NEEDLE NANO 2ND GEN 32G X 4 MM MISC Use needle with insulin pen to inject your insulin. 100 each 2   benzonatate (TESSALON) 100 MG capsule Take 1 capsule (100 mg total) by mouth 3 (three) times daily as needed. 20 capsule 0   Blood Glucose Monitoring Suppl (ACCU-CHEK GUIDE) w/Device KIT Use to check blood sugars 1-2 times daily. 1 kit 0   cetirizine (ZYRTEC ALLERGY) 10 MG tablet Take 1 tablet (10 mg total) by mouth daily. 90 tablet 1   clindamycin (CLEOCIN) 300 MG capsule Take 1 capsule (300 mg total) by mouth 3 (three) times daily. 30 capsule 0   Continuous Glucose Receiver (DEXCOM G7 RECEIVER) DEVI Use for continuous glucose monitoring. 3 each 3   Continuous Glucose Sensor (DEXCOM G7 SENSOR) MISC Use for continuous glucose monitoring. 3 each 3   empagliflozin (JARDIANCE) 10 MG TABS tablet Take 1 tablet (10 mg total) by mouth daily before breakfast. 30 tablet 2   fluticasone (FLONASE) 50 MCG/ACT nasal spray Place 2 sprays into both nostrils daily. 16 g 6   gabapentin (NEURONTIN) 100 MG capsule Take 1 capsule (100 mg total) by mouth 3 (three) times daily. 90 capsule 2   glucose blood (ACCU-CHEK GUIDE) test strip Use to test  1 to 2 times a day 100 each 0   LANTUS SOLOSTAR 100 UNIT/ML Solostar Pen INJECT 25 UNITS SUBCUTANEOUSLY ONCE DAILY 30 mL 0   losartan (COZAAR) 25 MG tablet Take 1 tablet (25 mg total) by mouth daily. (Patient not taking: Reported on 12/04/2022) 90 tablet 3   rosuvastatin (CRESTOR) 20 MG tablet Take 1 tablet (20 mg total) by mouth daily. 90 tablet 3   Semaglutide, 2 MG/DOSE, (OZEMPIC, 2 MG/DOSE,) 8 MG/3ML SOPN Inject 2 mg into the skin once a week. 3 mL 3   No current facility-administered medications on file prior to visit.    Family History  Problem Relation Age of Onset   Breast cancer Mother    Alcohol abuse Father    Hypertension Sister    Breast cancer Sister    Hypertension Brother    Diabetes type II Brother     Social History   Socioeconomic History   Marital status: Widowed    Spouse name: Not on file   Number of children: Not on file   Years of education: Not on file   Highest education level: Not on file  Occupational History   Not on file  Tobacco Use   Smoking status: Never   Smokeless tobacco: Never  Vaping Use   Vaping Use: Never used  Substance and Sexual Activity   Alcohol  use: Yes    Comment: rare in the last two years, 1x in 2-3 months   Drug use: Yes    Types: Marijuana    Comment: Occasional   Sexual activity: Yes  Other Topics Concern   Not on file  Social History Narrative   Not on file   Social Determinants of Health   Financial Resource Strain: Low Risk  (09/29/2022)   Overall Financial Resource Strain (CARDIA)    Difficulty of Paying Living Expenses: Not very hard  Food Insecurity: Food Insecurity Present (09/29/2022)   Hunger Vital Sign    Worried About Running Out of Food in the Last Year: Sometimes true    Ran Out of Food in the Last Year: Sometimes true  Transportation Needs: No Transportation Needs (09/29/2022)   PRAPARE - Administrator, Civil Service (Medical): No    Lack of Transportation (Non-Medical): No  Physical  Activity: Insufficiently Active (09/29/2022)   Exercise Vital Sign    Days of Exercise per Week: 2 days    Minutes of Exercise per Session: 10 min  Stress: Stress Concern Present (09/29/2022)   [employer] of Occupational Health - Occupational Stress Questionnaire    Feeling of Stress : Very much  Social Connections: Socially Isolated (09/29/2022)   Social Connection and Isolation Panel [NHANES]    Frequency of Communication with Friends and Family: More than three times a week    Frequency of Social Gatherings with Friends and Family: Never    Attends Religious Services: Never    Database administrator or Organizations: No    Attends Banker Meetings: Never    Marital Status: Widowed  Intimate Partner Violence: Not At Risk (09/29/2022)   Humiliation, Afraid, Rape, and Kick questionnaire    Fear of Current or Ex-Partner: No    Emotionally Abused: No    Physically Abused: No    Sexually Abused: No    Review of Systems: ROS negative except for what is noted on the assessment and plan.  Objective:   Vitals:   01/11/23 1346 01/11/23 1457  BP: (!) 177/112 (!) 163/102  Pulse: (!) 109 (!) 101  SpO2: 95%   Weight: (!) 365 lb 4.8 oz (165.7 kg)   Height: 6' (1.829 m)     Physical Exam: Constitutional: well-appearing in no acute distress HENT: normocephalic atraumatic, mucous membranes moist Eyes: conjunctiva non-erythematous Neck: supple Cardiovascular: tachycardic rate and rhythm, no m/r/g. No peripheral edema.  Bilateral pedal pulses palpated. Pulmonary/Chest: normal work of breathing on room air, lungs clear to auscultation bilaterally Abdominal: soft, non-tender, non-distended MSK: normal bulk and tone Neurological: alert & oriented x 3, 5/5 strength in bilateral upper extremities. Skin: warm and dry   Assessment & Plan:  Hypertension Patient has a history of hypertension currently on amlodipine.  He is also prescribed losartan but was unable to take it  last month due to an error at home which lead to his medication getting thrown out. Patient was also prescribed Jardiance, but had not taken because he was afraid to make his dizziness worse, and want to check with [place] before he began taking it.  Blood pressure elevated at 177/112 today, recheck blood pressure 163/102.  Plan: -Discussed taking his amlodipine, losartan, and Jardiance -Return visit in 3 weeks to assess blood pressure   Type 2 diabetes mellitus with unspecified complications (HCC) Patient has a history of poorly controlled type 2 diabetes.  He recently began using a continuous glucose monitor which helped  get their blood sugars under control.  HbA1c today 8.1%, down from 14.0% last visit.  CGM reviewed today, with great improvement in his glycemic control.  Denies numbness of the hands or feet, good pedal pulses today. - Continue semaglutide and insulin Lantus. - Begin Jardiance, ordered last visit picked up but never taken. - He will meet with the diabetes counselor at next visit. - Patient says he was unable to provide urine sample today. We will collect a albumin to creatinine ratio at next visit. -Ophthalmology referral for diabetic eye exam -Diabetic foot exam next visit.    Hyperlipidemia associated with type 2 diabetes mellitus (HCC) Patient has a history of hyperlipidemia.  -Lipid panel collected today, results pending -Consider increasing rosuvastatin if LDL above target level.  BPPV (benign paroxysmal positional vertigo) Patient describes dizziness and sense of ear fullness.  He says his dizziness is episodic, and last for few minutes at a time.  He first noticed the symptoms beginning about a month ago. He says he had these symptoms in the past, which resolved with therapy exercises for BPPV.  -Patient given instructions for performing BPPV exercises in after visit summary -Patient says he has done exercises from YouTube in the past, counseled patient on trying those  exercises again.  Morbid obesity (HCC) Patient BMI 49.54 today, slight weight loss since last visit.  Patient states he has been cleaning up his diet with the help of his continuous glucose monitor.  He says he is cut out sodas, sweets and fried foods.  Plan: -Continue lifestyle modifications -Continue Ozempic -Referral to Lupita Leash for nutritional counseling and diabetic management information at next visit  Healthcare maintenance Patient is behind on health maintenance such as colorectal cancer screening, diabetic eye exam, diabetic foot exam. Plan: -Placed referral to ophthalmology for diabetic eye exam -Short-term follow-up to conduct diabetic foot exam next visit, discuss colorectal cancer screening next visit.  Shoulder pain, bilateral Patient has a history of right sided shoulder and neck pain, he complains today of left-sided neck and shoulder pain.  Says the pain seems like an electric sensation that goes down his neck into his shoulder and upper arm, though he has difficulty characterizing the pain.  He has somewhat recently had a shoulder MRI with his orthopedist. Plan: -Referral to orthopedics for workup of left-sided neck and shoulder pain.    Patient seen with Dr. Alver Sorrow MD [facility]  PGY-1 Pager: [phone]  Phone: [phone] Date 01/11/2023  Time 4:35 PM

## 2023-01-11 NOTE — Assessment & Plan Note (Addendum)
Patient has a history of hypertension currently on amlodipine.  He is also prescribed losartan but was unable to take it last month due to an error at home which lead to his medication getting thrown out. Patient was also prescribed Jardiance, but had not taken because he was afraid to make his dizziness worse, and want to check with [place] before he began taking it.  Blood pressure elevated at 177/112 today, recheck blood pressure 163/102.  Plan: -Discussed taking his amlodipine, losartan, and Jardiance -Return visit in 3 weeks to assess blood pressure

## 2023-01-11 NOTE — Assessment & Plan Note (Signed)
Patient is behind on health maintenance such as colorectal cancer screening, diabetic eye exam, diabetic foot exam. Plan: -Placed referral to ophthalmology for diabetic eye exam -Short-term follow-up to conduct diabetic foot exam next visit, discuss colorectal cancer screening next visit.

## 2023-01-12 LAB — LIPID PANEL
Chol/HDL Ratio: 4 ratio (ref 0.0–5.0)
Cholesterol, Total: 190 mg/dL (ref 100–199)
HDL: 48 mg/dL (ref >39)
LDL Chol Calc (NIH): 129 mg/dL — ABNORMAL HIGH (ref 0–99)
Triglycerides: 73 mg/dL (ref 0–149)
VLDL Cholesterol Cal: 13 mg/dL (ref 5–40)

## 2023-01-12 LAB — BMP8+ANION GAP
Anion Gap: 15 mmol/L (ref 10.0–18.0)
BUN/Creatinine Ratio: 18 (ref 9–20)
BUN: 14 mg/dL (ref 6–24)
CO2: 21 mmol/L (ref 20–29)
Calcium: 9.8 mg/dL (ref 8.7–10.2)
Chloride: 99 mmol/L (ref 96–106)
Creatinine, Ser: 0.79 mg/dL (ref 0.76–1.27)
Glucose: 158 mg/dL — ABNORMAL HIGH (ref 70–99)
Potassium: 4.2 mmol/L (ref 3.5–5.2)
Sodium: 135 mmol/L (ref 134–144)
eGFR: 105 mL/min/{1.73_m2} (ref >59)

## 2023-01-12 NOTE — Progress Notes (Signed)
Internal Medicine Clinic Attending  I was physically present during the key portions of the resident provided service and participated in the medical decision making of patient's management care. I reviewed pertinent patient test results.  The assessment, diagnosis, and plan were formulated together and I agree with the documentation in the resident's note.  Patient here with persistent left upper extremity pain and radiculopathy. Symptoms somewhat improved with gabapentin. Exam today is not entirely consistent with cervical radiculopathy. He has limited ROM in his shoulder and tenderness over his supraspinatus tendon. He has some radiculopathy that seems to start over his lateral neck just superior to his shoulder. He has no muscle weakness or tenderness over his cervical spine. He was previously following with ortho for impingement syndrome on the right, feels that these symptoms are similar. We have placed a new referral to have him re establish with ortho.   His chronic conditions are uncontrolled; he has had some barriers to compliance but we are working on this. LDL is above goal for primary prevention but would ensure he is taking his Crestor regularly before increasing.   John Poll, MD

## 2023-01-29 ENCOUNTER — Telehealth: Payer: Self-pay

## 2023-01-29 NOTE — Telephone Encounter (Signed)
Call to pt reference PREP starting on 02/08/23 MW [id number] at [facility]. Confirmed he would like to participate.  Will do intake on 02/04/23 at 1130a. Will meet pt in lobby.

## 2023-02-01 ENCOUNTER — Telehealth: Payer: Self-pay | Admitting: Pharmacist

## 2023-02-01 ENCOUNTER — Other Ambulatory Visit: Payer: 59 | Admitting: Pharmacist

## 2023-02-01 DIAGNOSIS — G4733 Obstructive sleep apnea (adult) (pediatric): Secondary | ICD-10-CM | POA: Diagnosis not present

## 2023-02-01 NOTE — Progress Notes (Signed)
02/01/2023 Name: John [name] MRN: [id number] DOB: [birth year]  Chief Complaint  Patient presents with   Medication Management   Hypertension    John [name] is a 57 y.o. year old male who presented for a telephone visit.   They were referred to the pharmacist by their PCP for assistance in managing hypertension.    Subjective:  Care Team: Primary Care Provider: [name], MD ; Next Scheduled Visit: not scheduled  Medication Access/Adherence  Current Pharmacy:  [id number] - [place] ([place]), [place] - [address]  ([place]) [place] [address] Phone: [phone] Fax: [phone]  [id number] [place], [place] - [address] AT [facility] [address] [place] [address] Phone: [phone] Fax: [phone]   Patient reports affordability concerns with their medications: No  Patient reports access/transportation concerns to their pharmacy: No  Patient reports adherence concerns with their medications:  No    Asks about OTC testosterone for erectile dysfunction  Diabetes:  Current medications: Ozempic 2 mg weekly, Lantus 30 units daily (though notes he isn't consistently taking due to hypoglycemia); Jardiance 10 mg daily  Reports some hypoglycemic episodes when he takes Lantus every day.   Does ask if dizziness is related to Jardiance  Hypertension:  Current medications: losartan 25 mg daily, amlodipine 5 mg daily  Patient had a validated, automated, upper arm home BP cuff, but is not sure where it is.   Reports frustration that his blood pressure continues to be uncontrolled at the office  Hyperlipidemia/ASCVD Risk Reduction  Current lipid lowering medications: rosuvastatin 20 mg daily  Objective:  Lab Results  Component Value Date   HGBA1C 8.1 (A) 01/11/2023    Lab Results  Component Value Date   CREATININE 0.79 01/11/2023   BUN 14 01/11/2023   NA 135  01/11/2023   K 4.2 01/11/2023   CL 99 01/11/2023   CO2 21 01/11/2023    Lab Results  Component Value Date   CHOL 190 01/11/2023   HDL 48 01/11/2023   LDLCALC 129 (H) 01/11/2023   TRIG 73 01/11/2023   CHOLHDL 4.0 01/11/2023    Medications Reviewed Today     Reviewed by Alden Hipp, RPH-CPP (Pharmacist) on 02/01/23 at 1243  Med List Status: <None>   Medication Order Taking? Sig Documenting Provider Last Dose Status Informant  Accu-Chek Softclix Lancets lancets [id number]  Use 1 to 2 times a day Lovie Macadamia, MD  Active   amLODipine (NORVASC) 5 MG tablet [id number] Yes Take 1 tablet (5 mg total) by mouth at bedtime. Morene Crocker, MD Taking Active   BD PEN NEEDLE NANO 2ND GEN 32G X 4 MM MISC [id number]  Use needle with insulin pen to inject your insulin. Morene Crocker, MD  Active   Blood Glucose Monitoring Suppl (ACCU-CHEK GUIDE) w/Device KIT [id number]  Use to check blood sugars 1-2 times daily. [name], MD  Active   cetirizine [facility] ALLERGY) 10 MG tablet [id number] Yes Take 1 tablet (10 mg total) by mouth daily. Junie Spencer, FNP Taking Active   Continuous Glucose Receiver (DEXCOM G7 RECEIVER) [place] [id number] Yes Use for continuous glucose monitoring. [name], MD Taking Active   Continuous Glucose Sensor [employer] G7 SENSOR) [place] [id number] Yes Use for continuous glucose monitoring. [name], MD Taking Active   empagliflozin (JARDIANCE) 10 MG TABS tablet [id number] Yes Take 1 tablet (10 mg total) by mouth daily before breakfast. Crissie Sickles,  MD Taking Active   fluticasone (FLONASE) 50 MCG/ACT nasal spray [id number] Yes Place 2 sprays into both nostrils daily. Ward, Tylene Fantasia, PA-C Taking Active   gabapentin (NEURONTIN) 100 MG capsule [id number] Yes Take 1 capsule (100 mg total) by mouth 3 (three) times daily. [name], MD Taking Active   glucose blood (ACCU-CHEK GUIDE) test strip [id number]  Use to test 1 to 2 times a day [name], MD  Active   LANTUS SOLOSTAR 100 UNIT/ML Solostar Pen [id number] Yes INJECT 25 UNITS SUBCUTANEOUSLY ONCE DAILY [name], MD Taking Active            Med Note Raeanne Gathers Feb 01, 2023 12:42 PM) 30 units daily  losartan (COZAAR) 25 MG tablet [id number] Yes Take 1 tablet (25 mg total) by mouth daily. [name], MD Taking Active   rosuvastatin (CRESTOR) 20 MG tablet [id number] Yes Take 1 tablet (20 mg total) by mouth daily. [name], MD Taking Active   Semaglutide, 2 MG/DOSE, (OZEMPIC, 2 MG/DOSE,) 8 MG/3ML SOPN [id number] Yes Inject 2 mg into the skin once a week. [name], MD Taking Active               Assessment/Plan:   Diabetes: - Currently uncontrolled but improved. Praised patient for progress - Reviewed long term cardiovascular and renal outcomes of uncontrolled blood sugar - Reviewed goal A1c, goal fasting, and goal 2 hour post prandial glucose - Recommend to reduce Lantus to 20 units daily, continue Ozempic 2 mg weekly and Jardiance 10 mg daily. Will discuss with Care Team. - Recommend to check glucose continuously using CGM  Hypertension: - Currently uncontrolled - Reviewed long term cardiovascular and renal outcomes of uncontrolled blood pressure - Reviewed appropriate blood pressure monitoring technique and reviewed goal blood pressure. Recommended to check home blood pressure and heart rate daily. Discussed how to use OTC benefits to purchase [employer] Series 3 Upper Arm BP cuff.  - Recommend to increase losartan to 50 mg daily, continue amlodipine 5 mg daily. Will discuss with Care Team.    Hyperlipidemia/ASCVD Risk Reduction: - Currently uncontrolled, but adherence was unclear at last appointment - Recommend to continue current regimen at this time   Follow Up Plan: patient to schedule follow up with PCP,   Catie Eppie Gibson, PharmD, BCACP, CPP Clinical Pharmacist [facility] [phone]

## 2023-02-01 NOTE — Progress Notes (Unsigned)
Attempted to contact patient for scheduled appointment for medication management. Left HIPAA compliant message for patient to return my call at their convenience.   Will send MyChart.  Catie T. Harper, PharmD, BCACP, CPP Clinical Pharmacist [facility] [phone]  

## 2023-02-04 ENCOUNTER — Telehealth: Payer: Self-pay

## 2023-02-04 NOTE — Telephone Encounter (Signed)
VMT requesting call back. Had intake scheduled for today.

## 2023-02-05 NOTE — Patient Instructions (Addendum)
Hi John [name],   It was great talking to you today!  Reduce Lantus to 20 units daily.  Please call the office to schedule follow up with your primary care provider to discuss your blood pressure. Make sure you take both your losartan and amlodipine before that appointment.   I'll call in September to follow up on how you are doing.   Take care!  Catie Eppie Gibson, PharmD, BCACP, CPP Clinical Pharmacist [facility] [phone]

## 2023-02-24 ENCOUNTER — Other Ambulatory Visit: Payer: Self-pay | Admitting: Student

## 2023-03-09 ENCOUNTER — Other Ambulatory Visit: Payer: Self-pay | Admitting: Student

## 2023-03-09 ENCOUNTER — Other Ambulatory Visit: Payer: Self-pay

## 2023-03-10 ENCOUNTER — Other Ambulatory Visit (HOSPITAL_COMMUNITY): Payer: Self-pay

## 2023-03-11 ENCOUNTER — Other Ambulatory Visit: Payer: Self-pay

## 2023-03-11 ENCOUNTER — Other Ambulatory Visit (HOSPITAL_COMMUNITY): Payer: Self-pay

## 2023-03-11 MED ORDER — OZEMPIC (2 MG/DOSE) 8 MG/3ML ~~LOC~~ SOPN
2.0000 mg | PEN_INJECTOR | SUBCUTANEOUS | 0 refills | Status: DC
  Filled 2023-03-11 – 2023-03-25 (×2): qty 3, 28d supply, fill #0

## 2023-03-12 ENCOUNTER — Other Ambulatory Visit (HOSPITAL_COMMUNITY): Payer: Self-pay

## 2023-03-13 ENCOUNTER — Other Ambulatory Visit: Payer: Self-pay | Admitting: Student

## 2023-03-13 DIAGNOSIS — E118 Type 2 diabetes mellitus with unspecified complications: Secondary | ICD-10-CM

## 2023-03-14 DIAGNOSIS — G4733 Obstructive sleep apnea (adult) (pediatric): Secondary | ICD-10-CM | POA: Diagnosis not present

## 2023-03-18 ENCOUNTER — Other Ambulatory Visit: Payer: 59 | Admitting: Pharmacist

## 2023-03-18 NOTE — Progress Notes (Signed)
03/18/2023 Name: MORICE KUCZKOWSKI MRN: [id number] DOB: 03-09-1966  No chief complaint on file.   John [name] is a 57 y.o. year old male who presented for a telephone visit.   They were referred to the pharmacist by their PCP for assistance in managing diabetes.    Subjective:  Care Team: Primary Care Provider: [name], MD   Medication Access/Adherence  Current Pharmacy:  [facility] - [address] ([place]), [place] - [address] [place] ([place]) [place] [address] Phone: [phone] Fax: [phone]  [facility] #[id number] [place], [place] - [address] AT [facility] [address] [place] [address] Phone: [phone] Fax: [phone]   Diabetes:  Current medications: jardiance 10mg  daily, lantus 30 units daily, ozempic 2mg  weekly   Current glucose readings: 75-80 fasting at times & symptomatic at 75-85. When taking Lantus at 20units daily, fastings are more 115-125.   BG 125-130, through the day, 200-225 after food if bread/rice.   Using dexcom meter; testing continuously  Current meal patterns:  Proteins - Fish, lots of [place], boneless chicken thighs.  Veggies - Okra, onions, zuccini squash, carrots, etc  Sweets/snacks -zero sugar options  Water intake great  Current physical activity: exercise bike, ambulatory in hallway, walks dog   Current medication access support: dual covered    Hypertension:  Current medications: amlodipine 5mg  daily, losartan 25mg  daily Medications previously tried:   Patient does not have a validated, automated, upper arm home BP cuff Current blood pressure readings readings: 140-160s, is checked by his son's significant other who is an Charity fundraiser and patient states she checks both a machine and manual reading  Patient reports hypotensive s/sx including minor dizziness occasionally, if quick to stand up. Reviewed adequate hydration, be slow to rise & gain footing  before jumping up. Patient denies hypertensive symptoms including headache, chest pain, shortness of breath    Objective:  Lab Results  Component Value Date   HGBA1C 8.1 (A) 01/11/2023    Lab Results  Component Value Date   CREATININE 0.79 01/11/2023   BUN 14 01/11/2023   NA 135 01/11/2023   K 4.2 01/11/2023   CL 99 01/11/2023   CO2 21 01/11/2023    Lab Results  Component Value Date   CHOL 190 01/11/2023   HDL 48 01/11/2023   LDLCALC 129 (H) 01/11/2023   TRIG 73 01/11/2023   CHOLHDL 4.0 01/11/2023    Medications Reviewed Today     Reviewed by Gabriel Carina, RPH (Pharmacist) on 03/18/23 at 1337  Med List Status: <None>   Medication Order Taking? Sig Documenting Provider Last Dose Status Informant  Accu-Chek Softclix Lancets lancets [id number] Yes USE CHECK GLUCOSE ONCE TO TWICE DAILY [name], MD Taking Active   amLODipine (NORVASC) 5 MG tablet [id number]  Take 1 tablet (5 mg total) by mouth at bedtime. Morene Crocker, MD  Active   BD PEN NEEDLE NANO 2ND GEN 32G X 4 MM MISC [id number] Yes Use needle with insulin pen to inject your insulin. Morene Crocker, MD Taking Active   Blood Glucose Monitoring Suppl (ACCU-CHEK GUIDE) w/Device KIT [id number] Yes Use to check blood sugars 1-2 times daily. [name], MD Taking Active   cetirizine [facility] ALLERGY) 10 MG tablet [id number] Yes Take 1 tablet (10 mg total) by mouth daily. Junie Spencer, FNP Taking Active   Continuous Glucose Receiver (DEXCOM G7 RECEIVER) [place] [id number] Yes Use for continuous glucose monitoring. McLendon,  Casimiro Needle, MD Taking Active   Continuous Glucose Sensor (DEXCOM G7 SENSOR) [place] [id number] Yes Use for continuous glucose monitoring. [name], MD Taking Active   empagliflozin (JARDIANCE) 10 MG TABS tablet [id number] Yes Take 1 tablet (10 mg total) by mouth daily before breakfast. Crissie Sickles, MD Taking Active   fluticasone [facility]) 50 MCG/ACT nasal spray [id number] Yes Place 2  sprays into both nostrils daily. Ward, Tylene Fantasia, PA-C Taking Active   gabapentin (NEURONTIN) 100 MG capsule [id number] Yes Take 1 capsule (100 mg total) by mouth 3 (three) times daily. [name], MD Taking Active   glucose blood (ACCU-CHEK GUIDE) test strip [id number] Yes Use to test 1 to 2 times a day [name], MD Taking Active   LANTUS SOLOSTAR 100 UNIT/ML Solostar Pen [id number] Yes INJECT 25 UNITS SUBCUTANEOUSLY ONCE DAILY [name], MD Taking Active            Med Note Tyler Deis Mar 18, 2023  1:15 PM) 30 units daily as of 03/18/23 review    losartan (COZAAR) 25 MG tablet [id number] Yes Take 1 tablet (25 mg total) by mouth daily. [name], MD Taking Active   rosuvastatin (CRESTOR) 20 MG tablet [id number] Yes Take 1 tablet (20 mg total) by mouth daily. [name], MD Taking Active   Semaglutide, 2 MG/DOSE, (OZEMPIC, 2 MG/DOSE,) 8 MG/3ML SOPN [id number] Yes Inject 2 mg into the skin once a week. [name], MD Taking Active               Assessment/Plan:   Agree with assessment/plan from prior pharmacist engagement on 02/01/23, still pending implementation of medication changes, described below:  Diabetes: - Currently uncontrolled but A1c downtrending. - Reviewed long term cardiovascular and renal outcomes of uncontrolled blood sugar - Reviewed goal A1c, goal fasting, and goal 2 hour post prandial glucose - Reviewed dietary modifications including balanced meals - Recommend to decrease lantus to 20 units to avoid symptomatic BG ~75-80s.  Will collaborate with PCP - Recommend to check glucose using dexcom. Counseled patient on trying dexcom sensor applied to stomach for better convenience to patients daily living.     Hypertension: - Currently uncontrolled, systolic pressures persistently stay 140-160 when checking during asymptomatic periods, thus further BP lowering is still warranted. - Reviewed appropriate blood pressure monitoring technique and  reviewed goal blood pressure. Recommended to check home blood pressure and heart rate when son's significant other is able to do so - Recommend to increase losartan to 50mg  daily, will collaborate with PCP.    Follow Up Plan: 1 week to review medication changes after PCP visit  Lynnda Shields, PharmD, BCPS Clinical Pharmacist [facility]

## 2023-03-18 NOTE — Patient Instructions (Signed)
Basir, Thank you for speaking with me today! I have sent a message to Dr. Welton Flakes about some medicine dose changes in preparation for your office visit next week. Be sure to take your routine medicines, as you normally would, prior to that visit.   I will give you a phone call next week to touch base and support you through any medication changes.   Take care! Elmarie Shiley, PharmD, BCPS Clinical Pharmacist [facility]

## 2023-03-19 ENCOUNTER — Encounter: Payer: Self-pay | Admitting: Pharmacist

## 2023-03-19 ENCOUNTER — Other Ambulatory Visit: Payer: 59 | Admitting: Pharmacist

## 2023-03-22 ENCOUNTER — Other Ambulatory Visit: Payer: Self-pay | Admitting: Student

## 2023-03-24 ENCOUNTER — Encounter: Payer: 59 | Admitting: Internal Medicine

## 2023-03-25 ENCOUNTER — Other Ambulatory Visit: Payer: Self-pay | Admitting: Internal Medicine

## 2023-03-25 ENCOUNTER — Other Ambulatory Visit: Payer: Self-pay

## 2023-03-25 ENCOUNTER — Other Ambulatory Visit: Payer: 59 | Admitting: Pharmacist

## 2023-03-25 NOTE — Patient Instructions (Signed)
Check your blood pressure  three times weekly , and any time you have concerning symptoms like headache, chest pain, dizziness, shortness of breath, or vision changes.   Our goal is less than 130/80.  To appropriately check your blood pressure, make sure you do the following:  1) Avoid caffeine, exercise, or tobacco products for 30 minutes before checking. Empty your bladder. 2) Sit with your back supported in a flat-backed chair. Rest your arm on something flat (arm of the chair, table, etc). 3) Sit still with your feet flat on the floor, resting, for at least 5 minutes.  4) Check your blood pressure. Take 1-2 readings.  5) Write down these readings and bring with you to any provider appointments.  Bring your home blood pressure machine with you to a provider's office for accuracy comparison at least once a year.   Make sure you take your blood pressure medications before you come to any office visit, even if you were asked to fast for labs.

## 2023-03-25 NOTE — Progress Notes (Signed)
03/25/2023 Name: John [name] MRN: [id number] DOB: 21-Mar-1966  John [name] is a 57 y.o. year old male who presented for a telephone visit.   They were referred to the pharmacist by their PCP for assistance in managing diabetes and hypertension.    Subjective:  Care Team: Primary Care Provider: [name], MD   Medication Access/Adherence  Current Pharmacy:  [facility] - [address] ([place]), [place] - [address] [place] ([place]) [place] [address] Phone: [phone] Fax: [phone]  [facility] DR[facility] STORE #[id number] [place], [place] - [address] AT [facility] OF [facility] [address] [place] [address] Phone: [phone] Fax: [phone]   Diabetes:  Current medications: jardiance 10mg  daily, lantus 30 units daily, ozempic 2mg  weekly   Current glucose readings: 75-80 fasting at times & symptomatic at 75-85. When taking Lantus at 20units daily, fastings are more 115-125.   BG 125-130, through the day, 200-225 after food if bread/rice.   Using dexcom meter; testing continuously  Current meal patterns:  Proteins - Fish, lots of [place], boneless chicken thighs.  Veggies - Okra, onions, zuccini squash, carrots, etc  Sweets/snacks -zero sugar options  Water intake great  Current physical activity: exercise bike, ambulatory in hallway, walks dog   Current medication access support: dual covered    Hypertension:  Current medications: amlodipine 5mg  daily, losartan 25mg  daily (patient takes 2 occasionally and notes this helps his pressure)  Patient does have a validated, automated, upper arm home BP cuff Current blood pressure readings readings: 140-160s, is checked by his son's significant other who is an Charity fundraiser and patient states she checks both a machine and manual reading  Patient reports hypotensive s/sx including minor dizziness occasionally, if quick to stand up. Reviewed adequate hydration, be slow to rise  & gain footing before jumping up. Patient denies hypertensive symptoms including headache, chest pain, shortness of breath    Objective:  Lab Results  Component Value Date   HGBA1C 8.1 (A) 01/11/2023    Lab Results  Component Value Date   CREATININE 0.79 01/11/2023   BUN 14 01/11/2023   NA 135 01/11/2023   K 4.2 01/11/2023   CL 99 01/11/2023   CO2 21 01/11/2023    Lab Results  Component Value Date   CHOL 190 01/11/2023   HDL 48 01/11/2023   LDLCALC 129 (H) 01/11/2023   TRIG 73 01/11/2023   CHOLHDL 4.0 01/11/2023    Medications Reviewed Today     Reviewed by Gabriel Carina, RPH (Pharmacist) on 03/25/23 at 1118  Med List Status: <None>   Medication Order Taking? Sig Documenting Provider Last Dose Status Informant  Accu-Chek Softclix Lancets lancets [id number] No USE CHECK GLUCOSE ONCE TO TWICE DAILY [name], MD Taking Active   amLODipine (NORVASC) 5 MG tablet [id number] No Take 1 tablet (5 mg total) by mouth at bedtime. Morene Crocker, MD Taking Active   BD PEN NEEDLE NANO 2ND GEN 32G X 4 MM MISC [id number] No Use needle with insulin pen to inject your insulin. Morene Crocker, MD Taking Active   Blood Glucose Monitoring Suppl (ACCU-CHEK GUIDE) w/Device KIT [id number] No Use to check blood sugars 1-2 times daily. [name], MD Taking Active   cetirizine (ZYRTEC ALLERGY) 10 MG tablet [id number] No Take 1 tablet (10 mg total) by mouth daily. Junie Spencer, FNP Taking Active   Continuous Glucose Receiver (DEXCOM G7 RECEIVER) [place] [id number] No Use for continuous glucose monitoring. McLendon,  Casimiro Needle, MD Taking Active   Continuous Glucose Sensor (DEXCOM G7 SENSOR) [place] [id number]  USE FOR CONTINUOUSGLUCOSE MONITORING [name], MD  Active   empagliflozin (JARDIANCE) 10 MG TABS tablet [id number] No Take 1 tablet (10 mg total) by mouth daily before breakfast. Crissie Sickles, MD Taking Active   fluticasone [facility]) 50 MCG/ACT nasal spray [id number] No Place 2  sprays into both nostrils daily. Ward, Tylene Fantasia, PA-C Taking Active   gabapentin (NEURONTIN) 100 MG capsule [id number] No Take 1 capsule (100 mg total) by mouth 3 (three) times daily. [name], MD Taking Active   glucose blood (ACCU-CHEK GUIDE) test strip [id number] No Use to test 1 to 2 times a day [name], MD Taking Active   LANTUS SOLOSTAR 100 UNIT/ML Solostar Pen [id number] No INJECT 25 UNITS SUBCUTANEOUSLY ONCE DAILY [name], MD Taking Active            Med Note Tyler Deis Mar 18, 2023  1:15 PM) 30 units daily as of 03/18/23 review    losartan (COZAAR) 25 MG tablet [id number] No Take 1 tablet (25 mg total) by mouth daily. [name], MD Taking Active   rosuvastatin (CRESTOR) 20 MG tablet [id number] No Take 1 tablet (20 mg total) by mouth daily. [name], MD Taking Active   Semaglutide, 2 MG/DOSE, (OZEMPIC, 2 MG/DOSE,) 8 MG/3ML SOPN [id number] No Inject 2 mg into the skin once a week. [name], MD Taking Active              Assessment/Plan:   Agree with assessment/plan from prior pharmacist engagement on 02/01/23, still pending implementation of medication changes, described below:  Diabetes: - Currently uncontrolled but A1c downtrending. - Reviewed long term cardiovascular and renal outcomes of uncontrolled blood sugar - Reviewed goal A1c, goal fasting, and goal 2 hour post prandial glucose - Reviewed dietary modifications including balanced meals - Recommend to decrease lantus to 20 units to avoid symptomatic BG ~75-80s.  Will notify PCP for upcoming visit, thankfully patient reports no low values since our last call. - Recommend to check glucose using dexcom. Counseled patient on trying dexcom sensor applied to stomach for better convenience to patients daily living.   Hypertension: - Currently uncontrolled, systolic pressures persistently stay 140-160 when checking during asymptomatic periods, thus further BP lowering is still warranted. -  Reviewed appropriate blood pressure monitoring technique and reviewed goal blood pressure. Recommended to check home blood pressure and heart rate when son's significant other is able to do so - Recommend to increase losartan to 50mg  daily by taking 2 tablets daily starting 03/25/23, will collaborate with PCP. Patient missed PCP appt 03/24/23, he will contact office for reschedule.  Follow Up Plan: 1 week to review blood pressure readings  Lynnda Shields, PharmD, BCPS Clinical Pharmacist [facility]

## 2023-03-26 ENCOUNTER — Other Ambulatory Visit: Payer: Self-pay | Admitting: Internal Medicine

## 2023-03-26 ENCOUNTER — Other Ambulatory Visit: Payer: Self-pay

## 2023-03-26 ENCOUNTER — Other Ambulatory Visit (HOSPITAL_COMMUNITY): Payer: Self-pay

## 2023-03-26 MED ORDER — ACCU-CHEK GUIDE W/DEVICE KIT
PACK | 0 refills | Status: AC
  Filled 2023-03-26: qty 1, fill #0
  Filled 2023-03-30: qty 1, 30d supply, fill #0

## 2023-03-26 MED ORDER — LOSARTAN POTASSIUM 50 MG PO TABS: 50.0000 mg | ORAL_TABLET | Freq: Every day | ORAL | 3 refills | Status: DC

## 2023-03-30 ENCOUNTER — Other Ambulatory Visit (HOSPITAL_COMMUNITY): Payer: Self-pay

## 2023-03-31 ENCOUNTER — Other Ambulatory Visit: Payer: Self-pay

## 2023-04-01 ENCOUNTER — Other Ambulatory Visit: Payer: 59 | Admitting: Pharmacist

## 2023-04-01 NOTE — Patient Instructions (Addendum)
Sean,  [place] speaking with you today. I am so glad you are feeling better on the increased dose of losartan!!!   Review of what we discussed: - Apply your Dexcom sensor. I have gotten back into my provider portal, and I think there will be a spot in your app that you'll need to allow sharing. Look into the settings when you get it back on.  - Reach out to [facility] for jardiance refill  - Ordering an appropriate sized BP cuff: I checked on this and unfortunately with your medicare as primary insurance, it will run into trouble with [place] trying to order through medicaid secondary insurance. The recommendation is to call the number on the back of your medicare card, as they should have over-the-counter benefits that could assist with your costs, if you choose to return the one you ordered to get a different size.  - Lastly, send me a message in mychart within the next few days if you are able to get some BP readings. I'm curious to see what your pressures are running now.   Take care, Elmarie Shiley, PharmD, BCPS Clinical Pharmacist [facility]

## 2023-04-01 NOTE — Progress Notes (Signed)
04/01/2023 Name: John [name] MRN: [id number] DOB: 03-11-1966  John [name] is a 57 y.o. year old male who presented for a telephone visit.   They were referred to the pharmacist by their PCP for assistance in managing diabetes and hypertension.  We recently increased losartan dosing from 25mg  to 50mg  daily. Today patient reports a very noticeable difference in reduction of headaches, dizziness, and overall wellbeing!  Subjective:  Care Team: Primary Care Provider: [name], MD   Medication Access/Adherence  Current Pharmacy:  [facility] - [address] ([place]), [place] - [address] [place] ([place]) [place] [address] Phone: [phone] Fax: [phone]  [facility] DR[facility] STORE #[id number] [place], [place] - [address] AT [facility] OF [facility] [address] [place] [address] Phone: [phone] Fax: [phone]   Diabetes:  Current medications: jardiance 10mg  daily, lantus 30 units daily, ozempic 2mg  weekly   Current glucose readings: 113 this morning, 175-180 after food (if NOT having bread/rice) Previously 125-130, through the day, 200-225 after food if bread/rice.   Using fingerstick meter, though we discussed CGM and he decided he wants to put the dexcom back on. Counseled he can apply to stomach if desired (he previously had frustration of sensor falling off the arm)  Current meal patterns:  Proteins - Fish, lots of [place], boneless chicken thighs.  Veggies - Okra, onions, zuccini squash, carrots, etc  Sweets/snacks -zero sugar options  Water intake great  Current physical activity: exercise bike, ambulatory in hallway, walks dog   Current medication access support: dual covered    Hypertension:  Current medications: amlodipine 5mg  daily, losartan 50mg  daily (recent increase)  Patient does have a validated, automated, upper arm home BP cuff Current blood pressure readings readings: 140-160s, is checked  by his son's significant other who is an Charity fundraiser and patient states she checks both a machine and manual reading.  Feeling so much better, no headaches, or dizziness. Patient has self-improved his consistency with taking BP medication at same time of day, reports doing very well on increased losartan dosing.  He ordered a BP cuff, coming in the mail through [facility], though concern that it may not be appropriate size. Needs more jardiance, ask [facility] for refills, send mychart.    Objective:  Lab Results  Component Value Date   HGBA1C 8.1 (A) 01/11/2023    Lab Results  Component Value Date   CREATININE 0.79 01/11/2023   BUN 14 01/11/2023   NA 135 01/11/2023   K 4.2 01/11/2023   CL 99 01/11/2023   CO2 21 01/11/2023    Lab Results  Component Value Date   CHOL 190 01/11/2023   HDL 48 01/11/2023   LDLCALC 129 (H) 01/11/2023   TRIG 73 01/11/2023   CHOLHDL 4.0 01/11/2023    Medications Reviewed Today     Reviewed by Gabriel Carina, RPH (Pharmacist) on 04/01/23 at 2259  Med List Status: <None>   Medication Order Taking? Sig Documenting Provider Last Dose Status Informant  Accu-Chek Softclix Lancets lancets [id number]  USE CHECK GLUCOSE ONCE TO TWICE DAILY [name], MD  Active   amLODipine (NORVASC) 5 MG tablet [id number]  Take 1 tablet (5 mg total) by mouth at bedtime. Morene Crocker, MD  Active   BD PEN NEEDLE NANO 2ND GEN 32G X 4 MM MISC [id number]  Use needle with insulin pen to inject your insulin. Morene Crocker, MD  Active   Blood Glucose Monitoring Suppl (ACCU-CHEK  GUIDE) w/Device KIT [id number]  Use to check blood sugars 1-2 times daily. [name], MD  Active   cetirizine [facility] ALLERGY) 10 MG tablet [id number]  Take 1 tablet (10 mg total) by mouth daily. Junie Spencer, FNP  Active   Continuous Glucose Receiver (DEXCOM G7 RECEIVER) [place] [id number]  Use for continuous glucose monitoring. [name], MD  Active   Continuous Glucose Sensor (DEXCOM G7  SENSOR) [place] [id number]  USE FOR CONTINUOUSGLUCOSE MONITORING [name], MD  Active   empagliflozin (JARDIANCE) 10 MG TABS tablet [id number]  Take 1 tablet (10 mg total) by mouth daily before breakfast. Crissie Sickles, MD  Active   fluticasone [facility]) 50 MCG/ACT nasal spray [id number]  Place 2 sprays into both nostrils daily. Ward, Tylene Fantasia, PA-C  Active   gabapentin (NEURONTIN) 100 MG capsule [id number]  Take 1 capsule (100 mg total) by mouth 3 (three) times daily. [name], MD  Active   glucose blood (ACCU-CHEK GUIDE) test strip [id number]  Use to test 1 to 2 times a day [name], MD  Active   LANTUS SOLOSTAR 100 UNIT/ML Solostar Pen [id number]  INJECT 25 UNITS SUBCUTANEOUSLY ONCE DAILY [name], MD  Active            Med Note Tyler Deis Mar 18, 2023  1:15 PM) 30 units daily as of 03/18/23 review    losartan (COZAAR) 50 MG tablet [id number] Yes Take 1 tablet (50 mg total) by mouth daily. Mercie Eon, MD Taking Active   rosuvastatin (CRESTOR) 20 MG tablet [id number]  Take 1 tablet (20 mg total) by mouth daily. [name], MD  Active   Semaglutide, 2 MG/DOSE, (OZEMPIC, 2 MG/DOSE,) 8 MG/3ML SOPN [id number]  Inject 2 mg into the skin once a week. [name], MD  Active              Assessment/Plan:   Agree with assessment/plan from prior pharmacist engagement on 02/01/23, still pending implementation of medication changes, described below:  Diabetes: - Currently uncontrolled but A1c downtrending. - Reviewed long term cardiovascular and renal outcomes of uncontrolled blood sugar - Reviewed goal A1c, goal fasting, and goal 2 hour post prandial glucose - Reviewed dietary modifications including balanced meals - Recommend continue current regimen - Recommend to check glucose using dexcom. Counseled patient on trying dexcom sensor applied to stomach for better convenience to patients daily living.Will attempt to sync patient to dexcom clarity provider  portal.   Hypertension: - Currently unknown control, though patient expressed major improvement in BP related symptoms!  - Reviewed appropriate blood pressure monitoring technique and reviewed goal blood pressure. Recommended to check home blood pressure and heart rate when son's significant other is able to do so. He will go to local pharmacy or use his brothers cuff and respond with some readings in mychart. - Recommend continue current regimen, will investigate BP cuff cost coverage for appropriate size  Follow Up Plan: 1-2 weeks to review blood pressure and glucose readings  Lynnda Shields, PharmD, BCPS Clinical Pharmacist [facility]

## 2023-04-12 ENCOUNTER — Other Ambulatory Visit: Payer: Self-pay

## 2023-04-12 MED ORDER — EMPAGLIFLOZIN 10 MG PO TABS: 10.0000 mg | ORAL_TABLET | Freq: Every day | ORAL | 2 refills | Status: DC

## 2023-04-14 ENCOUNTER — Telehealth: Payer: Self-pay

## 2023-04-14 NOTE — Telephone Encounter (Signed)
Call to pt reference PREP.  Patient not interested at this time. He has been exercising at home on a bike.  Given my cell number in the event he changes his mind and wants to participate.

## 2023-04-16 ENCOUNTER — Other Ambulatory Visit (HOSPITAL_COMMUNITY): Payer: Self-pay

## 2023-04-16 ENCOUNTER — Other Ambulatory Visit: Payer: Self-pay | Admitting: Internal Medicine

## 2023-04-17 ENCOUNTER — Other Ambulatory Visit: Payer: Self-pay | Admitting: Internal Medicine

## 2023-04-19 ENCOUNTER — Other Ambulatory Visit: Payer: Self-pay

## 2023-04-19 ENCOUNTER — Encounter: Payer: 59 | Admitting: Student

## 2023-04-19 NOTE — Progress Notes (Deleted)
57 year old male with HTN, T2DM, schizophrenia 01/11/2023  Hypertension Patient has a history of hypertension currently on amlodipine.  He is also prescribed losartan but was unable to take it last month due to an error at home which lead to his medication getting thrown out. Patient was also prescribed Jardiance, but had not taken because he was afraid to make his dizziness worse, and want to check with [place] before he began taking it.  Blood pressure elevated at 177/112 today, recheck blood pressure 163/102.   Plan: -Discussed taking his amlodipine, losartan, and Jardiance -Return visit in 3 weeks to assess blood pressure     Type 2 diabetes mellitus with unspecified complications (HCC) Patient has a history of poorly controlled type 2 diabetes.  He recently began using a continuous glucose monitor which helped get their blood sugars under control.  HbA1c today 8.1%, down from 14.0% last visit.  CGM reviewed today, with great improvement in his glycemic control.  Denies numbness of the hands or feet, good pedal pulses today. - Continue semaglutide and insulin Lantus. - Begin Jardiance, ordered last visit picked up but never taken. - He will meet with the diabetes counselor at next visit. - Patient says he was unable to provide urine sample today. We will collect a albumin to creatinine ratio at next visit. -Ophthalmology referral for diabetic eye exam -Diabetic foot exam next visit.     Hyperlipidemia associated with type 2 diabetes mellitus (HCC) Patient has a history of hyperlipidemia.  -Lipid panel collected today, results pending -Consider increasing rosuvastatin if LDL above target level.   BPPV (benign paroxysmal positional vertigo) Patient describes dizziness and sense of ear fullness.  He says his dizziness is episodic, and last for few minutes at a time.  He first noticed the symptoms beginning about a month ago. He says he had these symptoms in the past, which resolved with therapy  exercises for BPPV.  -Patient given instructions for performing BPPV exercises in after visit summary -Patient says he has done exercises from YouTube in the past, counseled patient on trying those exercises again.   Morbid obesity (HCC) Patient BMI 49.54 today, slight weight loss since last visit.  Patient states he has been cleaning up his diet with the help of his continuous glucose monitor.  He says he is cut out sodas, sweets and fried foods.  Plan: -Continue lifestyle modifications -Continue Ozempic -Referral to Lupita Leash for nutritional counseling and diabetic management information at next visit   Healthcare maintenance Patient is behind on health maintenance such as colorectal cancer screening, diabetic eye exam, diabetic foot exam. Plan: -Placed referral to ophthalmology for diabetic eye exam -Short-term follow-up to conduct diabetic foot exam next visit, discuss colorectal cancer screening next visit.   Shoulder pain, bilateral Patient has a history of right sided shoulder and neck pain, he complains today of left-sided neck and shoulder pain.  Says the pain seems like an electric sensation that goes down his neck into his shoulder and upper arm, though he has difficulty characterizing the pain.  He has somewhat recently had a shoulder MRI with his orthopedist. Plan: -Referral to orthopedics for workup of left-sided neck and shoulder pain.

## 2023-04-21 ENCOUNTER — Other Ambulatory Visit: Payer: Self-pay | Admitting: Internal Medicine

## 2023-04-21 MED ORDER — ACCU-CHEK GUIDE VI STRP
ORAL_STRIP | 0 refills | Status: DC
  Filled 2023-04-21: qty 100, 50d supply, fill #0

## 2023-04-22 ENCOUNTER — Other Ambulatory Visit: Payer: Self-pay

## 2023-04-23 ENCOUNTER — Telehealth: Payer: Self-pay | Admitting: Internal Medicine

## 2023-04-23 NOTE — Telephone Encounter (Signed)
This patient has screened positive for food insecurity in the past.   On 03/29/23, patient was called to re-assess food insecurity needs.   Answers were updated in the SDOH wheel and resources were mailed or sent by mychart to the patient if requested.   John Coder, MD

## 2023-05-02 ENCOUNTER — Other Ambulatory Visit: Payer: Self-pay | Admitting: Internal Medicine

## 2023-05-02 DIAGNOSIS — E118 Type 2 diabetes mellitus with unspecified complications: Secondary | ICD-10-CM

## 2023-05-02 DIAGNOSIS — G4733 Obstructive sleep apnea (adult) (pediatric): Secondary | ICD-10-CM | POA: Diagnosis not present

## 2023-05-04 DIAGNOSIS — G4733 Obstructive sleep apnea (adult) (pediatric): Secondary | ICD-10-CM | POA: Diagnosis not present

## 2023-05-20 ENCOUNTER — Other Ambulatory Visit: Payer: Self-pay | Admitting: Internal Medicine

## 2023-05-25 ENCOUNTER — Other Ambulatory Visit: Payer: Self-pay | Admitting: Internal Medicine

## 2023-05-26 ENCOUNTER — Telehealth: Payer: 59 | Admitting: Family Medicine

## 2023-05-26 ENCOUNTER — Other Ambulatory Visit (HOSPITAL_COMMUNITY): Payer: Self-pay

## 2023-05-26 ENCOUNTER — Other Ambulatory Visit: Payer: Self-pay

## 2023-05-26 DIAGNOSIS — M25512 Pain in left shoulder: Secondary | ICD-10-CM

## 2023-05-26 MED ORDER — GABAPENTIN 100 MG PO CAPS
100.0000 mg | ORAL_CAPSULE | Freq: Three times a day (TID) | ORAL | 1 refills | Status: DC
  Filled 2023-05-26: qty 90, 30d supply, fill #0
  Filled 2023-07-14: qty 90, 30d supply, fill #1

## 2023-05-26 MED ORDER — ACCU-CHEK GUIDE VI STRP
ORAL_STRIP | 0 refills | Status: DC
  Filled 2023-05-26 (×2): qty 100, fill #0

## 2023-05-26 NOTE — Telephone Encounter (Signed)
LOV 01/11/23. Next appt scheduled 12/17 with PCP.

## 2023-05-26 NOTE — Patient Instructions (Signed)
Please go to [facility] as we discussed

## 2023-05-26 NOTE — Progress Notes (Signed)
Virtual Visit Consent   [name], you are scheduled for a virtual visit with a [name] provider today. Just as with appointments in the office, your consent must be obtained to participate. Your consent will be active for this visit and any virtual visit you may have with one of our providers in the next 365 days. If you have a MyChart account, a copy of this consent can be sent to you electronically.  As this is a virtual visit, video technology does not allow for your provider to perform a traditional examination. This may limit your provider's ability to fully assess your condition. If your provider identifies any concerns that need to be evaluated in person or the need to arrange testing (such as labs, EKG, etc.), we will make arrangements to do so. Although advances in technology are sophisticated, we cannot ensure that it will always work on either your end or our end. If the connection with a video visit is poor, the visit may have to be switched to a telephone visit. With either a video or telephone visit, we are not always able to ensure that we have a secure connection.  By engaging in this virtual visit, you consent to the provision of healthcare and authorize for your insurance to be billed (if applicable) for the services provided during this visit. Depending on your insurance coverage, you may receive a charge related to this service.  I need to obtain your verbal consent now. Are you willing to proceed with your visit today? John [name] has provided verbal consent on 05/26/2023 for a virtual visit (video or telephone). [name], NP  Date: 05/26/2023 11:05 AM  Virtual Visit via Video Note   I, [name], connected with  John [name]  ([id number], 01-28-1966) on 05/26/23 at 11:00 AM EST by a video-enabled telemedicine application and verified that I am speaking with the correct person using two identifiers.  Location: Patient: Virtual Visit Location Patient:  Home Provider: Virtual Visit Location Provider: Home Office   I discussed the limitations of evaluation and management by telemedicine and the availability of in person appointments. The patient expressed understanding and agreed to proceed.    History of Present Illness: John [name] is a 57 y.o. who identifies as a male who was assigned male at birth, and is being seen today for shoulder pain.  Left shoulder pain and hand is tingling about 6 months now.   Problems:  Patient Active Problem List   Diagnosis Date Noted   Shoulder pain, bilateral 01/11/2023   BPPV (benign paroxysmal positional vertigo) 01/11/2023   Viral upper respiratory infection 09/29/2022   Tachycardia 04/01/2022   Healthcare maintenance 12/03/2021   Erectile dysfunction 02/28/2020   Hyperlipidemia associated with type 2 diabetes mellitus (HCC) 10/18/2019   Type 2 diabetes mellitus with unspecified complications (HCC) 07/26/2019   Schizophrenia (HCC) 07/25/2019   Anxiety disorder 07/25/2019   OSA (obstructive sleep apnea) 08/17/2013   Morbid obesity (HCC) 08/17/2013   Hypertension 08/17/2013    Allergies: No Known Allergies Medications:  Current Outpatient Medications:    Accu-Chek Softclix Lancets lancets, USE CHECK GLUCOSE ONCE TO TWICE DAILY, Disp: 100 each, Rfl: 0   amLODipine (NORVASC) 5 MG tablet, Take 1 tablet (5 mg total) by mouth at bedtime., Disp: 90 tablet, Rfl: 2   BD PEN NEEDLE NANO 2ND GEN 32G X 4 MM MISC, Use needle with insulin pen to inject your insulin., Disp: 100 each, Rfl: 2  Blood Glucose Monitoring Suppl (ACCU-CHEK GUIDE) w/Device KIT, Use to check blood sugars 1-2 times daily., Disp: 1 kit, Rfl: 0   cetirizine (ZYRTEC ALLERGY) 10 MG tablet, Take 1 tablet (10 mg total) by mouth daily., Disp: 90 tablet, Rfl: 1   Continuous Glucose Receiver (DEXCOM G7 RECEIVER) DEVI, Use for continuous glucose monitoring., Disp: 3 each, Rfl: 3   Continuous Glucose Sensor (DEXCOM G7 SENSOR) MISC, USE FOR  CONTINUOUS GLUCOSE MONITORING., Disp: 3 each, Rfl: 0   empagliflozin (JARDIANCE) 10 MG TABS tablet, Take 1 tablet (10 mg total) by mouth daily before breakfast., Disp: 30 tablet, Rfl: 2   fluticasone (FLONASE) 50 MCG/ACT nasal spray, Place 2 sprays into both nostrils daily., Disp: 16 g, Rfl: 6   gabapentin (NEURONTIN) 100 MG capsule, Take 1 capsule (100 mg total) by mouth 3 (three) times daily., Disp: 90 capsule, Rfl: 2   glucose blood (ACCU-CHEK GUIDE) test strip, Use to test 1 to 2 times a day, Disp: 100 each, Rfl: 0   LANTUS SOLOSTAR 100 UNIT/ML Solostar Pen, INJECT 25 UNITS SUBCUTANEOUSLY ONCE DAILY, Disp: 30 mL, Rfl: 0   losartan (COZAAR) 50 MG tablet, Take 1 tablet (50 mg total) by mouth daily., Disp: 90 tablet, Rfl: 3   OZEMPIC, 2 MG/DOSE, 8 MG/3ML SOPN, INJECT 2 MG INTO THE SKIN ONCE A WEEK., Disp: 3 mL, Rfl: 0   rosuvastatin (CRESTOR) 20 MG tablet, Take 1 tablet (20 mg total) by mouth daily., Disp: 90 tablet, Rfl: 3  Observations/Objective: Patient is well-developed, well-nourished in no acute distress.  Resting comfortably  at home.  Head is normocephalic, atraumatic.  No labored breathing.  Speech is clear and coherent with logical content.  Patient is alert and oriented at baseline.    Assessment and Plan:  1. Left shoulder pain, unspecified chronicity  -given sensation changes- and inability to order diagnostic test, visit was stopped and patient was encouraged to go to [facility] .  Reviewed side effects, risks and benefits of medication.       Follow Up Instructions: I discussed the assessment and treatment plan with the patient. The patient was provided an opportunity to ask questions and all were answered. The patient agreed with the plan and demonstrated an understanding of the instructions.  A copy of instructions were sent to the patient via MyChart unless otherwise noted below.     The patient was advised to call back or seek an in-person evaluation if the  symptoms worsen or if the condition fails to improve as anticipated.    [name], NP

## 2023-06-01 ENCOUNTER — Encounter (HOSPITAL_COMMUNITY): Payer: Self-pay

## 2023-06-01 ENCOUNTER — Other Ambulatory Visit: Payer: Self-pay | Admitting: Internal Medicine

## 2023-06-01 ENCOUNTER — Other Ambulatory Visit (HOSPITAL_COMMUNITY): Payer: Self-pay

## 2023-06-01 MED ORDER — GLUCOSE BLOOD VI STRP
ORAL_STRIP | 0 refills | Status: DC
  Filled 2023-06-01: qty 100, 50d supply, fill #0

## 2023-06-02 ENCOUNTER — Other Ambulatory Visit: Payer: Self-pay

## 2023-06-02 ENCOUNTER — Other Ambulatory Visit (HOSPITAL_COMMUNITY): Payer: Self-pay

## 2023-06-13 DIAGNOSIS — G4733 Obstructive sleep apnea (adult) (pediatric): Secondary | ICD-10-CM | POA: Diagnosis not present

## 2023-06-20 ENCOUNTER — Telehealth: Payer: 59 | Admitting: Family Medicine

## 2023-06-20 DIAGNOSIS — B9689 Other specified bacterial agents as the cause of diseases classified elsewhere: Secondary | ICD-10-CM | POA: Diagnosis not present

## 2023-06-20 DIAGNOSIS — J019 Acute sinusitis, unspecified: Secondary | ICD-10-CM | POA: Diagnosis not present

## 2023-06-20 MED ORDER — DOXYCYCLINE HYCLATE 100 MG PO TABS: 100.0000 mg | ORAL_TABLET | Freq: Two times a day (BID) | ORAL | 0 refills | Status: AC

## 2023-06-20 NOTE — Patient Instructions (Signed)

## 2023-06-20 NOTE — Progress Notes (Signed)
Virtual Visit Consent   John [name], you are scheduled for a virtual visit with a [facility] provider today. Just as with appointments in the office, your consent must be obtained to participate. Your consent will be active for this visit and any virtual visit you may have with one of our providers in the next 365 days. If you have a MyChart account, a copy of this consent can be sent to you electronically.  As this is a virtual visit, video technology does not allow for your provider to perform a traditional examination. This may limit your provider's ability to fully assess your condition. If your provider identifies any concerns that need to be evaluated in person or the need to arrange testing (such as labs, EKG, etc.), we will make arrangements to do so. Although advances in technology are sophisticated, we cannot ensure that it will always work on either your end or our end. If the connection with a video visit is poor, the visit may have to be switched to a telephone visit. With either a video or telephone visit, we are not always able to ensure that we have a secure connection.  By engaging in this virtual visit, you consent to the provision of healthcare and authorize for your insurance to be billed (if applicable) for the services provided during this visit. Depending on your insurance coverage, you may receive a charge related to this service.  I need to obtain your verbal consent now. Are you willing to proceed with your visit today? John [name] has provided verbal consent on 06/20/2023 for a virtual visit (video or telephone). Georgana Curio, FNP  Date: 06/20/2023 6:04 PM  Virtual Visit via Video Note   I, Georgana Curio, connected with  John [name]  ([id number], 06-06-1966) on 06/20/23 at  6:00 PM EST by a video-enabled telemedicine application and verified that I am speaking with the correct person using two identifiers.  Location: Patient: Virtual Visit Location Patient:  Home Provider: Virtual Visit Location Provider: Home Office   I discussed the limitations of evaluation and management by telemedicine and the availability of in person appointments. The patient expressed understanding and agreed to proceed.    History of Present Illness: John [name] is a 57 y.o. who identifies as a male who was assigned male at birth, and is being seen today for sinus pain and pressure, sx for 5-6 days, Sinus pressure worse on the rt side, green mucus, no fever, minimal cough. Sx worsening. [name]  HPI: HPI  Problems:  Patient Active Problem List   Diagnosis Date Noted   Shoulder pain, bilateral 01/11/2023   BPPV (benign paroxysmal positional vertigo) 01/11/2023   Viral upper respiratory infection 09/29/2022   Tachycardia 04/01/2022   Healthcare maintenance 12/03/2021   Erectile dysfunction 02/28/2020   Hyperlipidemia associated with type 2 diabetes mellitus (HCC) 10/18/2019   Type 2 diabetes mellitus with unspecified complications (HCC) 07/26/2019   Schizophrenia (HCC) 07/25/2019   Anxiety disorder 07/25/2019   OSA (obstructive sleep apnea) 08/17/2013   Morbid obesity (HCC) 08/17/2013   Hypertension 08/17/2013    Allergies: No Known Allergies Medications:  Current Outpatient Medications:    doxycycline (VIBRA-TABS) 100 MG tablet, Take 1 tablet (100 mg total) by mouth 2 (two) times daily for 10 days., Disp: 20 tablet, Rfl: 0   Accu-Chek Softclix Lancets lancets, USE CHECK GLUCOSE ONCE TO TWICE DAILY, Disp: 100 each, Rfl: 0   amLODipine (NORVASC) 5 MG tablet, Take 1 tablet (5 mg total)  by mouth at bedtime., Disp: 90 tablet, Rfl: 2   BD PEN NEEDLE NANO 2ND GEN 32G X 4 MM MISC, Use needle with insulin pen to inject your insulin., Disp: 100 each, Rfl: 2   Blood Glucose Monitoring Suppl (ACCU-CHEK GUIDE) w/Device KIT, Use to check blood sugars 1-2 times daily., Disp: 1 kit, Rfl: 0   cetirizine (ZYRTEC ALLERGY) 10 MG tablet, Take 1 tablet (10 mg total) by mouth daily.,  Disp: 90 tablet, Rfl: 1   Continuous Glucose Receiver (DEXCOM G7 RECEIVER) DEVI, Use for continuous glucose monitoring., Disp: 3 each, Rfl: 3   Continuous Glucose Sensor (DEXCOM G7 SENSOR) MISC, USE FOR CONTINUOUS GLUCOSE MONITORING., Disp: 3 each, Rfl: 0   empagliflozin (JARDIANCE) 10 MG TABS tablet, Take 1 tablet (10 mg total) by mouth daily before breakfast., Disp: 30 tablet, Rfl: 2   fluticasone (FLONASE) 50 MCG/ACT nasal spray, Place 2 sprays into both nostrils daily., Disp: 16 g, Rfl: 6   gabapentin (NEURONTIN) 100 MG capsule, Take 1 capsule (100 mg total) by mouth 3 (three) times daily., Disp: 90 capsule, Rfl: 1   glucose blood test strip, Use to check blood sugar once to twice daily, Disp: 100 each, Rfl: 0   LANTUS SOLOSTAR 100 UNIT/ML Solostar Pen, INJECT 25 UNITS SUBCUTANEOUSLY ONCE DAILY, Disp: 30 mL, Rfl: 0   losartan (COZAAR) 50 MG tablet, Take 1 tablet (50 mg total) by mouth daily., Disp: 90 tablet, Rfl: 3   OZEMPIC, 2 MG/DOSE, 8 MG/3ML SOPN, INJECT 2 MG INTO THE SKIN ONCE A WEEK., Disp: 3 mL, Rfl: 0   rosuvastatin (CRESTOR) 20 MG tablet, Take 1 tablet (20 mg total) by mouth daily., Disp: 90 tablet, Rfl: 3  Observations/Objective: Patient is well-developed, well-nourished in no acute distress.  Resting comfortably  at home.  Head is normocephalic, atraumatic.  No labored breathing.  Speech is clear and coherent with logical content.  Patient is alert and oriented at baseline.   Assessment and Plan: 1. Acute bacterial sinusitis  Increase fluids, humidifier at night, ibuprofen as directed urgent care as needed.   Follow Up Instructions: I discussed the assessment and treatment plan with the patient. The patient was provided an opportunity to ask questions and all were answered. The patient agreed with the plan and demonstrated an understanding of the instructions.  A copy of instructions were sent to the patient via MyChart unless otherwise noted below.     The patient was  advised to call back or seek an in-person evaluation if the symptoms worsen or if the condition fails to improve as anticipated.    Georgana Curio, FNP

## 2023-06-28 ENCOUNTER — Other Ambulatory Visit: Payer: Self-pay | Admitting: Internal Medicine

## 2023-06-29 ENCOUNTER — Encounter: Payer: 59 | Admitting: Internal Medicine

## 2023-06-29 NOTE — Progress Notes (Deleted)
   CC: PCP follow up  HPI:  [name] is a 57 y.o. with medical history of HTN, HLD, T2DM, Class III obesity, OSA, GAD and schizoprenia presenting to [facility] for a follow up. Pt last seen in07/2024 and advised to return in 3 weeks.   Please see problem-based list for further details, assessments, and plans.  Past Medical History:  Diagnosis Date   Anxiety disorder    Erectile dysfunction    Hyperlipidemia    Hypertension    Obesity, morbid, BMI 50 or higher (HCC)    Poorly controlled type 2 diabetes mellitus (HCC)    Schizophrenia (HCC)    Sleep apnea     Current Outpatient Medications (Endocrine & Metabolic):    empagliflozin (JARDIANCE) 10 MG TABS tablet, Take 1 tablet (10 mg total) by mouth daily before breakfast.   LANTUS SOLOSTAR 100 UNIT/ML Solostar Pen, INJECT 25 UNITS SUBCUTANEOUSLY ONCE DAILY   OZEMPIC, 2 MG/DOSE, 8 MG/3ML SOPN, INJECT 2 MG INTO THE SKIN ONCE A WEEK.  Current Outpatient Medications (Cardiovascular):    amLODipine (NORVASC) 5 MG tablet, Take 1 tablet (5 mg total) by mouth at bedtime.   losartan (COZAAR) 50 MG tablet, Take 1 tablet (50 mg total) by mouth daily.   rosuvastatin (CRESTOR) 20 MG tablet, Take 1 tablet (20 mg total) by mouth daily.  Current Outpatient Medications (Respiratory):    cetirizine (ZYRTEC ALLERGY) 10 MG tablet, Take 1 tablet (10 mg total) by mouth daily.   fluticasone (FLONASE) 50 MCG/ACT nasal spray, Place 2 sprays into both nostrils daily.    Current Outpatient Medications (Other):    Accu-Chek Softclix Lancets lancets, USE CHECK GLUCOSE ONCE TO TWICE DAILY   BD PEN NEEDLE NANO 2ND GEN 32G X 4 MM MISC, Use needle with insulin pen to inject your insulin.   Blood Glucose Monitoring Suppl (ACCU-CHEK GUIDE) w/Device KIT, Use to check blood sugars 1-2 times daily.   Continuous Glucose Receiver (DEXCOM G7 RECEIVER) DEVI, Use for continuous glucose monitoring.   Continuous Glucose Sensor (DEXCOM G7 SENSOR) MISC, USE FOR CONTINUOUS  GLUCOSE MONITORING.   doxycycline (VIBRA-TABS) 100 MG tablet, Take 1 tablet (100 mg total) by mouth 2 (two) times daily for 10 days.   gabapentin (NEURONTIN) 100 MG capsule, Take 1 capsule (100 mg total) by mouth 3 (three) times daily.   glucose blood test strip, Use to check blood sugar once to twice daily  Review of Systems:  Review of system negative unless stated in the problem list or HPI.    Physical Exam:  There were no vitals filed for this visit. Physical Exam General: NAD HENT: NCAT Lungs: CTAB, no wheeze, rhonchi or rales.  Cardiovascular: Normal heart sounds, no r/m/g, 2+ pulses in all extremities. No LE edema Abdomen: No TTP, normal bowel sounds MSK: No asymmetry or muscle atrophy.  Skin: no lesions noted on exposed skin Neuro: Alert and oriented x4. CN grossly intact Psych: Normal mood and normal affect   Assessment & Plan:   No problem-specific Assessment & Plan notes found for this encounter.   See Encounters Tab for problem based charting.  Patient Discussed with Dr. {NAMES:[id number]::"Guilloud","Hoffman","Mullen","Narendra","Vincent","Machen","Lau","Hatcher","Williams"} [name], MD John [name]. [facility] Internal Medicine Residency, PGY-3  Care Gaps Foot exam Flu shot A1c HIV screening Eye exam

## 2023-07-14 ENCOUNTER — Other Ambulatory Visit: Payer: Self-pay | Admitting: Internal Medicine

## 2023-07-14 ENCOUNTER — Other Ambulatory Visit (HOSPITAL_COMMUNITY): Payer: Self-pay

## 2023-07-15 ENCOUNTER — Other Ambulatory Visit (HOSPITAL_BASED_OUTPATIENT_CLINIC_OR_DEPARTMENT_OTHER): Payer: Self-pay

## 2023-07-15 ENCOUNTER — Other Ambulatory Visit: Payer: Self-pay

## 2023-07-16 ENCOUNTER — Other Ambulatory Visit: Payer: Self-pay | Admitting: Student

## 2023-07-16 ENCOUNTER — Other Ambulatory Visit: Payer: Self-pay | Admitting: Internal Medicine

## 2023-07-16 DIAGNOSIS — E118 Type 2 diabetes mellitus with unspecified complications: Secondary | ICD-10-CM

## 2023-07-18 ENCOUNTER — Other Ambulatory Visit (HOSPITAL_COMMUNITY): Payer: Self-pay

## 2023-07-18 MED ORDER — ACCU-CHEK GUIDE TEST VI STRP
ORAL_STRIP | 0 refills | Status: DC
  Filled 2023-07-18: qty 100, 50d supply, fill #0

## 2023-07-19 ENCOUNTER — Other Ambulatory Visit (HOSPITAL_COMMUNITY): Payer: Self-pay

## 2023-07-19 ENCOUNTER — Other Ambulatory Visit: Payer: Self-pay

## 2023-07-19 NOTE — Telephone Encounter (Signed)
 Medication sent to pharmacy

## 2023-07-24 ENCOUNTER — Other Ambulatory Visit: Payer: Self-pay | Admitting: Internal Medicine

## 2023-07-28 ENCOUNTER — Encounter: Payer: 59 | Admitting: Student

## 2023-07-31 DIAGNOSIS — G4733 Obstructive sleep apnea (adult) (pediatric): Secondary | ICD-10-CM | POA: Diagnosis not present

## 2023-08-02 DIAGNOSIS — G4733 Obstructive sleep apnea (adult) (pediatric): Secondary | ICD-10-CM | POA: Diagnosis not present

## 2023-08-04 ENCOUNTER — Other Ambulatory Visit (HOSPITAL_BASED_OUTPATIENT_CLINIC_OR_DEPARTMENT_OTHER): Payer: Self-pay

## 2023-08-10 ENCOUNTER — Encounter: Payer: 59 | Admitting: Student

## 2023-08-11 DIAGNOSIS — M5412 Radiculopathy, cervical region: Secondary | ICD-10-CM | POA: Diagnosis not present

## 2023-08-16 ENCOUNTER — Telehealth: Payer: Self-pay | Admitting: *Deleted

## 2023-08-16 NOTE — Telephone Encounter (Signed)
This patient has not seen one of our doctors since 07/14/2022 and last order we sent was in 2021

## 2023-08-16 NOTE — Telephone Encounter (Signed)
John [name] w/[employer] left msg w/answ service that  states : "FU on RX CPAP supplies. Documents were sent in 01/16"

## 2023-08-17 DIAGNOSIS — M5412 Radiculopathy, cervical region: Secondary | ICD-10-CM | POA: Diagnosis not present

## 2023-08-17 NOTE — Telephone Encounter (Signed)
Patient was last seen 12/04/2019- patient would be a new patient- needs consult before any supplies can be ordered. Closing encounter

## 2023-08-17 NOTE — Telephone Encounter (Signed)
There is no orders for the PCC's to work on

## 2023-08-19 ENCOUNTER — Encounter: Payer: 59 | Admitting: Student

## 2023-08-24 ENCOUNTER — Encounter: Payer: Self-pay | Admitting: Primary Care

## 2023-08-24 ENCOUNTER — Telehealth: Payer: 59 | Admitting: Primary Care

## 2023-08-24 ENCOUNTER — Telehealth: Payer: Self-pay

## 2023-08-24 VITALS — Ht 71.0 in | Wt 363.0 lb

## 2023-08-24 DIAGNOSIS — G4733 Obstructive sleep apnea (adult) (pediatric): Secondary | ICD-10-CM

## 2023-08-24 NOTE — Telephone Encounter (Signed)
Pt needs a f/u appointment in 1 year with Beth, Np for CPAP compliance. Thanks!

## 2023-08-24 NOTE — Progress Notes (Signed)
Virtual Visit via Video Note  I connected with John [name] on 08/24/23 at 10:30 AM EST by a video enabled telemedicine application and verified that I am speaking with the correct person using two identifiers.  Location: Patient: Home Provider: Office    I discussed the limitations of evaluation and management by telemedicine and the availability of in person appointments. The patient expressed understanding and agreed to proceed.  History of Present Illness: 58 year old male, never smoked. Past medical history significant for obstructive sleep apnea, hypertension, morbid obesity. Former patient of Dr. Craige Cotta. Maintained on CPAP at 17cm h20.   Previous [facility] pulmonary encounter 05/04/2019 Patient contacted today for televisit annual follow-up for sleep apnea.  He is under percent compliant with CPAP, no issues with mask fit or pressure settings. He sleeps well through the night, occasionally waking up to use the restroom.  He reports less daytime fatigue and has more energy.  He does not have a current PCP or mental health provider. He needs a refill of his lisinopril-hydrochlorothiazide blood pressure medication.    Airview download: 30/30 days; 100% > 4 hours Average usage 8 hours Pressure 17cm H20 AHI 0.4   08/24/2023- Interim hx  Discussed the use of AI scribe software for clinical note transcription with the patient, who gave verbal consent to proceed.  History of Present Illness   John [name] is a 58 year old male with sleep apnea who presents for follow-up regarding CPAP use.  He uses a CPAP machine for sleep apnea, having received a new machine in 2021. The current machine is up to date, but he notes that it runs out of water quickly, possibly due to humidification settings. The pressure setting is at 17 cm of water, and he uses a full face mask with no significant air leaks.  A download from his CPAP machine from July 25, 2023 to August 23, 2023 shows 100%  compliance with an average use of nine hours per night. His residual apnea index is one event per hour, indicating well-controlled sleep apnea.  He benefits from CPAP use, as he no longer experiences daytime sleepiness. He is doing well with his full face mask.  He [facility] his medical supplies from [facility] but is interested in purchasing mask through [email or web address]  Airview download 07/25/2023 - 08/23/2023 Usage days 30/30 days (100%) Average usage 9 hours 12 minutes Pressure 17 cm H2O Air leaks 4.8 L/min (95%) AHI 1.0     Observations/Objective:  Unable to connect video visit  TESTS: CPAP 08/14/14 >> CPAP 17 cm H2O >> AHI 3.3, no R. CPAP 10/06/14 to 11/04/14 >> used on 30 of 30 nights with average 7 hrs and 21 min.  Average AHI is 2.8 with CPAP 17 cm H2O.  Assessment and Plan:  1. OSA (obstructive sleep apnea) (Primary)     Obstructive Sleep Apnea Well controlled with CPAP at 17 cm H2O. 100% compliant with an average use of 9 hours per night. Residual apnea index of 1 event per hour. No significant air leaks. Noted improvement in daytime sleepiness. Issue with CPAP machine running out of water quickly. Current use of full face mask with no issues. Using [employer] for medical supplies. -Reduce humidification setting from 4 to 2 to conserve water. -Continue current CPAP settings. -Advise on sleep positioning if unable to use CPAP (e.g. during power outage). Consider purchasing a backup power source or travel CPAP machine for emergencies. Provide prescription if needed. -Schedule follow-up in 1 year unless  issues arise.     Follow Up Instructions:  1 year with Beth NP   I discussed the assessment and treatment plan with the patient. The patient was provided an opportunity to ask questions and all were answered. The patient agreed with the plan and demonstrated an understanding of the instructions.   The patient was advised to call back or seek an in-person evaluation if the  symptoms worsen or if the condition fails to improve as anticipated.  I provided 22 minutes of non-face-to-face time during this encounter.   Glenford Bayley, NP

## 2023-08-24 NOTE — Patient Instructions (Signed)
 -  OBSTRUCTIVE SLEEP APNEA: Obstructive sleep apnea is a condition where your airway becomes blocked during sleep, causing breathing pauses. Your sleep apnea is well controlled with your current CPAP settings, and you are using the machine consistently. To address the issue of your CPAP machine running out of water quickly, we recommend reducing the humidification setting from 4 to 2. Continue using your CPAP as you have been, and consider purchasing a backup power source or a travel CPAP machine for emergencies. We also advise you to be mindful of your sleep position and avoid alcohol if you are unable to use your CPAP machine, such as during a power outage. We will schedule a follow-up in one year unless any issues arise.  -CPAP SUPPLIES: You are currently using a full face mask with your CPAP machine and obtaining your supplies from [employer] [facility]. We will renew your prescription for CPAP supplies to ensure you continue to have what you need.  INSTRUCTIONS:  Please reduce the humidification setting on your CPAP machine from 4 to 2 to help conserve water. Continue using your CPAP machine as you have been, and consider getting a backup power source or travel CPAP machine for emergencies. Be mindful of your sleep position and avoid alcohol if you cannot use your CPAP machine. We will schedule a follow-up appointment in one year unless any issues arise.

## 2023-08-25 ENCOUNTER — Encounter: Payer: 59 | Admitting: Student

## 2023-08-26 ENCOUNTER — Ambulatory Visit: Payer: 59 | Admitting: Student

## 2023-08-26 ENCOUNTER — Other Ambulatory Visit: Payer: Self-pay | Admitting: Internal Medicine

## 2023-08-26 VITALS — BP 145/86 | HR 103 | Temp 98.3°F | Ht 71.0 in | Wt 351.5 lb

## 2023-08-26 DIAGNOSIS — E785 Hyperlipidemia, unspecified: Secondary | ICD-10-CM

## 2023-08-26 DIAGNOSIS — Z7984 Long term (current) use of oral hypoglycemic drugs: Secondary | ICD-10-CM | POA: Diagnosis not present

## 2023-08-26 DIAGNOSIS — M5412 Radiculopathy, cervical region: Secondary | ICD-10-CM | POA: Diagnosis not present

## 2023-08-26 DIAGNOSIS — G5601 Carpal tunnel syndrome, right upper limb: Secondary | ICD-10-CM

## 2023-08-26 DIAGNOSIS — E1169 Type 2 diabetes mellitus with other specified complication: Secondary | ICD-10-CM

## 2023-08-26 DIAGNOSIS — E118 Type 2 diabetes mellitus with unspecified complications: Secondary | ICD-10-CM | POA: Diagnosis not present

## 2023-08-26 DIAGNOSIS — G4733 Obstructive sleep apnea (adult) (pediatric): Secondary | ICD-10-CM | POA: Diagnosis not present

## 2023-08-26 DIAGNOSIS — M25531 Pain in right wrist: Secondary | ICD-10-CM

## 2023-08-26 DIAGNOSIS — Z794 Long term (current) use of insulin: Secondary | ICD-10-CM | POA: Diagnosis not present

## 2023-08-26 DIAGNOSIS — Z7985 Long-term (current) use of injectable non-insulin antidiabetic drugs: Secondary | ICD-10-CM | POA: Diagnosis not present

## 2023-08-26 DIAGNOSIS — Z23 Encounter for immunization: Secondary | ICD-10-CM

## 2023-08-26 DIAGNOSIS — M25539 Pain in unspecified wrist: Secondary | ICD-10-CM | POA: Insufficient documentation

## 2023-08-26 DIAGNOSIS — I10 Essential (primary) hypertension: Secondary | ICD-10-CM | POA: Diagnosis not present

## 2023-08-26 DIAGNOSIS — Z0000 Encounter for general adult medical examination without abnormal findings: Secondary | ICD-10-CM

## 2023-08-26 LAB — POCT GLYCOSYLATED HEMOGLOBIN (HGB A1C): Hemoglobin A1C: 6.2 % — AB (ref 4.0–5.6)

## 2023-08-26 LAB — GLUCOSE, CAPILLARY: Glucose-Capillary: 137 mg/dL — ABNORMAL HIGH (ref 70–99)

## 2023-08-26 MED ORDER — ACCU-CHEK GUIDE TEST VI STRP
ORAL_STRIP | 3 refills | Status: DC
Start: 2023-08-26 — End: 2023-12-23

## 2023-08-26 MED ORDER — BD PEN NEEDLE NANO 2ND GEN 32G X 4 MM MISC: 3 refills | Status: AC

## 2023-08-26 MED ORDER — WRIST SPLINT/ELASTIC RIGHT XL MISC: 1.0000 | Freq: Every day | 0 refills | Status: AC

## 2023-08-26 MED ORDER — AMLODIPINE BESYLATE 10 MG PO TABS
10.0000 mg | ORAL_TABLET | Freq: Every day | ORAL | 3 refills | Status: AC
Start: 2023-08-26 — End: ?

## 2023-08-26 MED ORDER — DICLOFENAC SODIUM 1 % EX GEL: 4.0000 g | Freq: Four times a day (QID) | CUTANEOUS | 0 refills | Status: DC

## 2023-08-26 MED ORDER — ACCU-CHEK SOFTCLIX LANCETS MISC: 3 refills | Status: DC

## 2023-08-26 NOTE — Assessment & Plan Note (Signed)
Has history of poorly controlled diabetes, hemoglobin A1C has been improving. Slightly better today at 6.2 (down from 8.1 last July). Patient has also lost some weight although it seems that this fluctuates.   -Cw Sematglutide  -cw 25 units nightly of Lantus -cw Jardiance 10mg  daily  -patient states he would like to provide urine sample at next vist and not today. Declining urine sample today.  -ophthalmology referral -foot exam completed today

## 2023-08-26 NOTE — Assessment & Plan Note (Signed)
Has been using CPAP nightly.

## 2023-08-26 NOTE — Assessment & Plan Note (Signed)
Patient states he has not been taking statin regularly. Counseled him on the need for him to be on a statin, he agrees to start taking it more consistently.   -Lipid panel at next visit  -Crestor 20mg  daily

## 2023-08-26 NOTE — Telephone Encounter (Signed)
Medication sent to pharmacy

## 2023-08-26 NOTE — Patient Instructions (Addendum)
Thank you, Mr.Srinivas D Duthie for allowing [place] to provide your care today.   I have ordered the following labs for you:  Lab Orders         POC Hbg A1C      Referrals ordered today:    Referral Orders         Ambulatory referral to Gastroenterology         Ambulatory referral to Ophthalmology      I have ordered the following medication/changed the following medications:   Follow up: 3 months   You have a condition called carpal tunnel. It is caused by impingement of a nerve in our wrist. For your pain:   Please use a wrist splint. Only take it off during your sleep.  You can decrease some of the pain with an ice pack  Take advil 600mg  every 6 hours as needed not more than two weeks at a time with food  Take tylenol 1000mg  every 6 hours are needed for pain  Move your fingers often to reduce stiffness and swelling .   General Health maintenance:   Please see an eye doctor to check on your eyes for your diabetes.  I have sent in a referral for your colonoscopy.    For your blood pressure:  Increase amlodipine to 10mg  daily, you can use your current 5mg  prescription until it runs out by taking 2 tablets a day.  Continue taking Losartan once daily.   For your Crestor, take it daily so we can measure your lipids.   Should you have any questions or concerns please call the internal medicine clinic at [phone].    Manuela Neptune, MD [facility]

## 2023-08-26 NOTE — Assessment & Plan Note (Signed)
On amlodipine 5mg  daily and losartan 50mg  daily. He only took one of his BP meds this AM, as he would take the amlodipine in the evening. Took amlodipine last night. BP remains elevated at 145/86. Will increase amlodipine to 10mg  daily.   -cw Losartan 50mg  daily  -start amlodipine 10mg  daily

## 2023-08-26 NOTE — Assessment & Plan Note (Signed)
Referral for colonoscopy sent.

## 2023-08-26 NOTE — Progress Notes (Signed)
CC:  Chief Complaint  Patient presents with   Follow-up    Follow up bp / right wrist pain going up arm x 1 week -pain #9 / refill on DM supplies-strips.    HPI:  [name] is a 58 y.o. male living with a history stated below and presents today for . Please see problem based assessment and plan for additional details.  Past Medical History:  Diagnosis Date   Anxiety disorder    Erectile dysfunction    Hyperlipidemia    Hypertension    Obesity, morbid, BMI 50 or higher (HCC)    Poorly controlled type 2 diabetes mellitus (HCC)    Schizophrenia (HCC)    Sleep apnea     Current Outpatient Medications on File Prior to Visit  Medication Sig Dispense Refill   traMADol (ULTRAM) 50 MG tablet Take 50 mg by mouth every 6 (six) hours as needed for moderate pain (pain score 4-6).     Blood Glucose Monitoring Suppl (ACCU-CHEK GUIDE) w/Device KIT Use to check blood sugars 1-2 times daily. 1 kit 0   gabapentin (NEURONTIN) 100 MG capsule Take 1 capsule (100 mg total) by mouth 3 (three) times daily. 90 capsule 1   JARDIANCE 10 MG TABS tablet TAKE 1 TABLET BY MOUTH ONCE DAILY BEFORE BREAKFAST 30 tablet 0   LANTUS SOLOSTAR 100 UNIT/ML Solostar Pen INJECT 25 UNITS SUBCUTANEOUSLY ONCE DAILY 30 mL 0   losartan (COZAAR) 50 MG tablet Take 1 tablet (50 mg total) by mouth daily. 90 tablet 3   rosuvastatin (CRESTOR) 20 MG tablet Take 1 tablet (20 mg total) by mouth daily. 90 tablet 3   No current facility-administered medications on file prior to visit.    Family History  Problem Relation Age of Onset   Breast cancer Mother    Alcohol abuse Father    Hypertension Sister    Breast cancer Sister    Hypertension Brother    Diabetes type II Brother     Social History   Socioeconomic History   Marital status: Widowed    Spouse name: Not on file   Number of children: Not on file   Years of education: Not on file   Highest education level: Not on file  Occupational History   Not on  file  Tobacco Use   Smoking status: Never   Smokeless tobacco: Never  Vaping Use   Vaping status: Never Used  Substance and Sexual Activity   Alcohol use: Yes    Comment: rare in the last two years, 1x in 2-3 months   Drug use: Yes    Types: Marijuana    Comment: Occasional   Sexual activity: Yes  Other Topics Concern   Not on file  Social History Narrative   Not on file   Social Drivers of Health   Financial Resource Strain: Low Risk  (09/29/2022)   Overall Financial Resource Strain (CARDIA)    Difficulty of Paying Living Expenses: Not very hard  Food Insecurity: Food Insecurity Present (03/29/2023)   Hunger Vital Sign    Worried About Running Out of Food in the Last Year: Sometimes true    Ran Out of Food in the Last Year: Sometimes true  Transportation Needs: No Transportation Needs (09/29/2022)   PRAPARE - Administrator, Civil Service (Medical): No    Lack of Transportation (Non-Medical): No  Physical Activity: Insufficiently Active (09/29/2022)   Exercise Vital Sign    Days of Exercise per Week: 2  days    Minutes of Exercise per Session: 10 min  Stress: Stress Concern Present (09/29/2022)   [employer] of Occupational Health - Occupational Stress Questionnaire    Feeling of Stress : Very much  Social Connections: Socially Isolated (09/29/2022)   Social Connection and Isolation Panel [NHANES]    Frequency of Communication with Friends and Family: More than three times a week    Frequency of Social Gatherings with Friends and Family: Never    Attends Religious Services: Never    Database administrator or Organizations: No    Attends Banker Meetings: Never    Marital Status: Widowed  Intimate Partner Violence: Not At Risk (09/29/2022)   Humiliation, Afraid, Rape, and Kick questionnaire    Fear of Current or Ex-Partner: No    Emotionally Abused: No    Physically Abused: No    Sexually Abused: No    Review of Systems: ROS negative except  for what is noted on the assessment and plan.  Vitals:   08/26/23 1404 08/26/23 1426  BP: (!) 153/93 (!) 145/86  Pulse: (!) 110 (!) 103  Temp: 98.3 F (36.8 C)   TempSrc: Oral   SpO2: 96%   Weight: (!) 351 lb 8 oz (159.4 kg)   Height: 5\' 11"  (1.803 m)     Physical Exam: Constitutional: sitting in chair, in discomfort  HENT: normocephalic atraumatic, mucous membranes moist Eyes: conjunctiva non-erythematous Cardiovascular: regular rate and rhythm, no m/r/g Pulmonary/Chest: normal work of breathing on room air, lungs clear to auscultation bilaterally Abdominal: soft, non-tender, non-distended MSK: normal bulk and tone, positive phalen's test, negative tinnels test, positive deQuervain sign Neurological: alert & oriented x 3, no focal deficit Skin: warm and dry Psych: normal mood and behavior  Assessment & Plan:   Patient discussed with Dr. Mayford Knife  Hypertension On amlodipine 5mg  daily and losartan 50mg  daily. He only took one of his BP meds this AM, as he would take the amlodipine in the evening. Took amlodipine last night. BP remains elevated at 145/86. Will increase amlodipine to 10mg  daily.   -cw Losartan 50mg  daily  -start amlodipine 10mg  daily   OSA (obstructive sleep apnea) Has been using CPAP nightly.  Type 2 diabetes mellitus with unspecified complications (HCC) Has history of poorly controlled diabetes, hemoglobin A1C has been improving. Slightly better today at 6.2 (down from 8.1 last July). Patient has also lost some weight although it seems that this fluctuates.   -Cw Sematglutide  -cw 25 units nightly of Lantus -cw Jardiance 10mg  daily  -patient states he would like to provide urine sample at next vist and not today. Declining urine sample today.  -ophthalmology referral -foot exam completed today  Hyperlipidemia associated with type 2 diabetes mellitus [facility]) Patient states he has not been taking statin regularly. Counseled him on the need for him to be  on a statin, he agrees to start taking it more consistently.   -Lipid panel at next visit  -Crestor 20mg  daily  Wrist pain Patient is right handed and presents with right wrist pain that started about two weeks ago. It radiated up his medial forearm. Tenderness over medial nerve distrubution although he also has a radial component. Phalen's test is positive, as well as deQuervain's test. He is right handed and uses his right hand a lot of type on his phone. He does have sensation changes on the thumb, index and middle fingers and paresthesias over the thenar eminence. He is morbidly obese and has T2DM.  If no improvement can consider referral to Ortho. He has used 650mg  of tylenol with no relief, as well as tramadol that he takes consistently at home for his cervical radiculopathy.  Plan: Wrist splint ice pack  Advil 600mg  every 6 hours as needed not more than two weeks at a time with food  Tylenol 1000mg  every 6 hours are needed for pain  Voltaren gel Move fingers often to reduce stiffness and swelling    Manuela Neptune, MD [facility] Internal [facility], PGY-1 Phone: [phone] Date 08/26/2023 Time 10:30 PM

## 2023-08-26 NOTE — Assessment & Plan Note (Signed)
Patient is right handed and presents with right wrist pain that started about two weeks ago. It radiated up his medial forearm. Tenderness over medial nerve distrubution although he also has a radial component. Phalen's test is positive, as well as deQuervain's test. He is right handed and uses his right hand a lot of type on his phone. He does have sensation changes on the thumb, index and middle fingers and paresthesias over the thenar eminence. He is morbidly obese and has T2DM. If no improvement can consider referral to Ortho. He has used 650mg  of tylenol with no relief, as well as tramadol that he takes consistently at home for his cervical radiculopathy.  Plan: Wrist splint ice pack  Advil 600mg  every 6 hours as needed not more than two weeks at a time with food  Tylenol 1000mg  every 6 hours are needed for pain  Voltaren gel Move fingers often to reduce stiffness and swelling

## 2023-08-27 ENCOUNTER — Other Ambulatory Visit: Payer: Self-pay | Admitting: Internal Medicine

## 2023-08-27 ENCOUNTER — Telehealth: Payer: 59

## 2023-08-27 DIAGNOSIS — E118 Type 2 diabetes mellitus with unspecified complications: Secondary | ICD-10-CM

## 2023-08-30 ENCOUNTER — Other Ambulatory Visit: Payer: Self-pay

## 2023-08-31 DIAGNOSIS — G4733 Obstructive sleep apnea (adult) (pediatric): Secondary | ICD-10-CM | POA: Diagnosis not present

## 2023-09-01 MED ORDER — GABAPENTIN 100 MG PO CAPS: 100.0000 mg | ORAL_CAPSULE | Freq: Three times a day (TID) | ORAL | 1 refills | Status: AC

## 2023-09-01 NOTE — Telephone Encounter (Signed)
Please address refill request 

## 2023-09-06 NOTE — Progress Notes (Signed)
 Internal Medicine Clinic Attending  Case discussed with the resident at the time of the visit.  We reviewed the resident's history and exam and pertinent patient test results.  I agree with the assessment, diagnosis, and plan of care documented in the resident's note.

## 2023-09-20 ENCOUNTER — Telehealth: Admitting: Physician Assistant

## 2023-09-20 DIAGNOSIS — J019 Acute sinusitis, unspecified: Secondary | ICD-10-CM | POA: Diagnosis not present

## 2023-09-20 DIAGNOSIS — R051 Acute cough: Secondary | ICD-10-CM

## 2023-09-20 DIAGNOSIS — B9689 Other specified bacterial agents as the cause of diseases classified elsewhere: Secondary | ICD-10-CM

## 2023-09-20 MED ORDER — AMOXICILLIN-POT CLAVULANATE 875-125 MG PO TABS: 1.0000 | ORAL_TABLET | Freq: Two times a day (BID) | ORAL | 0 refills | Status: DC

## 2023-09-20 NOTE — Progress Notes (Signed)
 Virtual Visit Consent   John [name], you are scheduled for a virtual visit with a [facility] provider today. Just as with appointments in the office, your consent must be obtained to participate. Your consent will be active for this visit and any virtual visit you may have with one of our providers in the next 365 days. If you have a MyChart account, a copy of this consent can be sent to you electronically.  As this is a virtual visit, video technology does not allow for your provider to perform a traditional examination. This may limit your provider's ability to fully assess your condition. If your provider identifies any concerns that need to be evaluated in person or the need to arrange testing (such as labs, EKG, etc.), we will make arrangements to do so. Although advances in technology are sophisticated, we cannot ensure that it will always work on either your end or our end. If the connection with a video visit is poor, the visit may have to be switched to a telephone visit. With either a video or telephone visit, we are not always able to ensure that we have a secure connection.  By engaging in this virtual visit, you consent to the provision of healthcare and authorize for your insurance to be billed (if applicable) for the services provided during this visit. Depending on your insurance coverage, you may receive a charge related to this service.  I need to obtain your verbal consent now. Are you willing to proceed with your visit today? John [name] has provided verbal consent on 09/20/2023 for a virtual visit (video or telephone). [name], PA-C  Date: 09/20/2023 10:07 AM   Virtual Visit via Video Note   I, [name], connected with  John [name]  ([id number], August 01, 1965) on 09/20/23 at 10:00 AM EDT by a video-enabled telemedicine application and verified that I am speaking with the correct person using two identifiers.  Location: Patient: Virtual Visit Location  Patient: Home Provider: Virtual Visit Location Provider: Home Office   I discussed the limitations of evaluation and management by telemedicine and the availability of in person appointments. The patient expressed understanding and agreed to proceed.    History of Present Illness: John [name] is a 58 y.o. who identifies as a male who was assigned male at birth, and is being seen today for acute on chronic sinusitis.  HPI: Sinusitis This is a new problem. The current episode started in the past 7 days. There has been no fever. The pain is mild. Associated symptoms include chills, congestion, coughing, headaches, sinus pressure (maxillary pressure) and a sore throat (from post nasal drainage). Pertinent negatives include no diaphoresis, ear pain, hoarse voice or shortness of breath. (Ears itching when he wakes up) Treatments tried: cough drops, zyrtec, flonase. The treatment provided no relief.     Problems:  Patient Active Problem List   Diagnosis Date Noted   Wrist pain 08/26/2023   Shoulder pain, bilateral 01/11/2023   BPPV (benign paroxysmal positional vertigo) 01/11/2023   Viral upper respiratory infection 09/29/2022   Tachycardia 04/01/2022   Healthcare maintenance 12/03/2021   Erectile dysfunction 02/28/2020   Hyperlipidemia associated with type 2 diabetes mellitus (HCC) 10/18/2019   Type 2 diabetes mellitus with unspecified complications (HCC) 07/26/2019   Schizophrenia (HCC) 07/25/2019   Anxiety disorder 07/25/2019   OSA (obstructive sleep apnea) 08/17/2013   Morbid obesity (HCC) 08/17/2013   Hypertension 08/17/2013    Allergies: No Known Allergies Medications:  Current Outpatient Medications:    Accu-Chek Softclix Lancets lancets, USE CHECK GLUCOSE ONCE TO TWICE DAILY, Disp: 100 each, Rfl: 3   amLODipine (NORVASC) 10 MG tablet, Take 1 tablet (10 mg total) by mouth at bedtime., Disp: 90 tablet, Rfl: 3   amoxicillin-clavulanate (AUGMENTIN) 875-125 MG tablet, Take 1 tablet  by mouth 2 (two) times daily., Disp: 14 tablet, Rfl: 0   BD PEN NEEDLE NANO 2ND GEN 32G X 4 MM MISC, USE NEEDLE WITH INSULIN PEN TO INJECT YOUR INSULIN AS DIRECTED, Disp: 100 each, Rfl: 3   Blood Glucose Monitoring Suppl (ACCU-CHEK GUIDE) w/Device KIT, Use to check blood sugars 1-2 times daily., Disp: 1 kit, Rfl: 0   diclofenac Sodium (VOLTAREN ARTHRITIS PAIN) 1 % GEL, Apply 4 g topically 4 (four) times daily., Disp: 4 g, Rfl: 0   Elastic Bandages & Supports (WRIST SPLINT/ELASTIC RIGHT XL) MISC, 1 Application by Does not apply route daily., Disp: 1 each, Rfl: 0   gabapentin (NEURONTIN) 100 MG capsule, Take 1 capsule (100 mg total) by mouth 3 (three) times daily., Disp: 90 capsule, Rfl: 1   glucose blood (ACCU-CHEK GUIDE TEST) test strip, Use to check blood sugar once to twice daily, Disp: 100 each, Rfl: 3   JARDIANCE 10 MG TABS tablet, TAKE 1 TABLET BY MOUTH ONCE DAILY BEFORE BREAKFAST, Disp: 30 tablet, Rfl: 0   LANTUS SOLOSTAR 100 UNIT/ML Solostar Pen, INJECT 25 UNITS SUBCUTANEOUSLY ONCE DAILY, Disp: 30 mL, Rfl: 0   losartan (COZAAR) 50 MG tablet, Take 1 tablet (50 mg total) by mouth daily., Disp: 90 tablet, Rfl: 3   OZEMPIC, 2 MG/DOSE, 8 MG/3ML SOPN, INJECT 2 MG INTO THE SKIN ONCE A WEEK., Disp: 3 mL, Rfl: 0   rosuvastatin (CRESTOR) 20 MG tablet, Take 1 tablet (20 mg total) by mouth daily., Disp: 90 tablet, Rfl: 3   traMADol (ULTRAM) 50 MG tablet, Take 50 mg by mouth every 6 (six) hours as needed for moderate pain (pain score 4-6)., Disp: , Rfl:   Observations/Objective: Patient is well-developed, well-nourished in no acute distress.  Resting comfortably at home.  Head is normocephalic, atraumatic.  No labored breathing.  Speech is clear and coherent with logical content.  Patient is alert and oriented at baseline.    Assessment and Plan: 1. Acute bacterial sinusitis (Primary) - amoxicillin-clavulanate (AUGMENTIN) 875-125 MG tablet; Take 1 tablet by mouth 2 (two) times daily.  Dispense: 14  tablet; Refill: 0  - Worsening symptoms that have not responded to OTC medications.  - Will give Augmentin - Continue allergy medications.  - Steam and humidifier can help - Stay well hydrated and get plenty of rest.  - Seek in person evaluation if no symptom improvement or if symptoms worsen   Follow Up Instructions: I discussed the assessment and treatment plan with the patient. The patient was provided an opportunity to ask questions and all were answered. The patient agreed with the plan and demonstrated an understanding of the instructions.  A copy of instructions were sent to the patient via MyChart unless otherwise noted below.    The patient was advised to call back or seek an in-person evaluation if the symptoms worsen or if the condition fails to improve as anticipated.    [name], PA-C

## 2023-09-20 NOTE — Patient Instructions (Signed)
 John [name], thank you for joining [name], PA-C for today's virtual visit.  While this provider is not your primary care provider (PCP), if your PCP is located in our provider database this encounter information will be shared with them immediately following your visit.   A [facility] MyChart account gives you access to today's visit and all your visits, tests, and labs performed at [facility] " click here if you don't have a [facility] MyChart account or go to [email or web address]  Consent: (Patient) John [name] provided verbal consent for this virtual visit at the beginning of the encounter.  Current Medications:  Current Outpatient Medications:    Accu-Chek Softclix Lancets lancets, USE CHECK GLUCOSE ONCE TO TWICE DAILY, Disp: 100 each, Rfl: 3   amLODipine (NORVASC) 10 MG tablet, Take 1 tablet (10 mg total) by mouth at bedtime., Disp: 90 tablet, Rfl: 3   amoxicillin-clavulanate (AUGMENTIN) 875-125 MG tablet, Take 1 tablet by mouth 2 (two) times daily., Disp: 14 tablet, Rfl: 0   BD PEN NEEDLE NANO 2ND GEN 32G X 4 MM MISC, USE NEEDLE WITH INSULIN PEN TO INJECT YOUR INSULIN AS DIRECTED, Disp: 100 each, Rfl: 3   Blood Glucose Monitoring Suppl (ACCU-CHEK GUIDE) w/Device KIT, Use to check blood sugars 1-2 times daily., Disp: 1 kit, Rfl: 0   diclofenac Sodium (VOLTAREN ARTHRITIS PAIN) 1 % GEL, Apply 4 g topically 4 (four) times daily., Disp: 4 g, Rfl: 0   Elastic Bandages & Supports (WRIST SPLINT/ELASTIC RIGHT XL) MISC, 1 Application by Does not apply route daily., Disp: 1 each, Rfl: 0   gabapentin (NEURONTIN) 100 MG capsule, Take 1 capsule (100 mg total) by mouth 3 (three) times daily., Disp: 90 capsule, Rfl: 1   glucose blood (ACCU-CHEK GUIDE TEST) test strip, Use to check blood sugar once to twice daily, Disp: 100 each, Rfl: 3   JARDIANCE 10 MG TABS tablet, TAKE 1 TABLET BY MOUTH ONCE DAILY BEFORE BREAKFAST, Disp: 30 tablet, Rfl: 0   LANTUS SOLOSTAR 100  UNIT/ML Solostar Pen, INJECT 25 UNITS SUBCUTANEOUSLY ONCE DAILY, Disp: 30 mL, Rfl: 0   losartan (COZAAR) 50 MG tablet, Take 1 tablet (50 mg total) by mouth daily., Disp: 90 tablet, Rfl: 3   OZEMPIC, 2 MG/DOSE, 8 MG/3ML SOPN, INJECT 2 MG INTO THE SKIN ONCE A WEEK., Disp: 3 mL, Rfl: 0   rosuvastatin (CRESTOR) 20 MG tablet, Take 1 tablet (20 mg total) by mouth daily., Disp: 90 tablet, Rfl: 3   traMADol (ULTRAM) 50 MG tablet, Take 50 mg by mouth every 6 (six) hours as needed for moderate pain (pain score 4-6)., Disp: , Rfl:    Medications ordered in this encounter:  Meds ordered this encounter  Medications   amoxicillin-clavulanate (AUGMENTIN) 875-125 MG tablet    Sig: Take 1 tablet by mouth 2 (two) times daily.    Dispense:  14 tablet    Refill:  0    Supervising Provider:   Merrilee Jansky [[id number]]     *If you need refills on other medications prior to your next appointment, please contact your pharmacy*  Follow-Up: Call back or seek an in-person evaluation if the symptoms worsen or if the condition fails to improve as anticipated.  [facility] [phone]  Other Instructions Sinus Infection, Adult A sinus infection, also called sinusitis, is inflammation of your sinuses. Sinuses are hollow spaces in the bones around your face. Your sinuses are located: Around your eyes. In the middle of your  forehead. Behind your nose. In your cheekbones. Mucus normally drains out of your sinuses. When your nasal tissues become inflamed or swollen, mucus can become trapped or blocked. This allows bacteria, viruses, and fungi to grow, which leads to infection. Most infections of the sinuses are caused by a virus. A sinus infection can develop quickly. It can last for up to 4 weeks (acute) or for more than 12 weeks (chronic). A sinus infection often develops after a cold. What are the causes? This condition is caused by anything that creates swelling in the sinuses or stops mucus from  draining. This includes: Allergies. Asthma. Infection from bacteria or viruses. Deformities or blockages in your nose or sinuses. Abnormal growths in the nose (nasal polyps). Pollutants, such as chemicals or irritants in the air. Infection from fungi. This is rare. What increases the risk? You are more likely to develop this condition if you: Have a weak body defense system (immune system). Do a lot of swimming or diving. Overuse nasal sprays. Smoke. What are the signs or symptoms? The main symptoms of this condition are pain and a feeling of pressure around the affected sinuses. Other symptoms include: Stuffy nose or congestion that makes it difficult to breathe through your nose. Thick yellow or greenish drainage from your nose. Tenderness, swelling, and warmth over the affected sinuses. A cough that may get worse at night. Decreased sense of smell and taste. Extra mucus that collects in the throat or the back of the nose (postnasal drip) causing a sore throat or bad breath. Tiredness (fatigue). Fever. How is this diagnosed? This condition is diagnosed based on: Your symptoms. Your medical history. A physical exam. Tests to find out if your condition is acute or chronic. This may include: Checking your nose for nasal polyps. Viewing your sinuses using a device that has a light (endoscope). Testing for allergies or bacteria. Imaging tests, such as an MRI or CT scan. In rare cases, a bone biopsy may be done to rule out more serious types of fungal sinus disease. How is this treated? Treatment for a sinus infection depends on the cause and whether your condition is chronic or acute. If caused by a virus, your symptoms should go away on their own within 10 days. You may be given medicines to relieve symptoms. They include: Medicines that shrink swollen nasal passages (decongestants). A spray that eases inflammation of the nostrils (topical intranasal corticosteroids). Rinses  that help get rid of thick mucus in your nose (nasal saline washes). Medicines that treat allergies (antihistamines). Over-the-counter pain relievers. If caused by bacteria, your health care provider may recommend waiting to see if your symptoms improve. Most bacterial infections will get better without antibiotic medicine. You may be given antibiotics if you have: A severe infection. A weak immune system. If caused by narrow nasal passages or nasal polyps, surgery may be needed. Follow these instructions at home: Medicines Take, use, or apply over-the-counter and prescription medicines only as told by your health care provider. These may include nasal sprays. If you were prescribed an antibiotic medicine, take it as told by your health care provider. Do not stop taking the antibiotic even if you start to feel better. Hydrate and humidify  Drink enough fluid to keep your urine pale yellow. Staying hydrated will help to thin your mucus. Use a cool mist humidifier to keep the humidity level in your home above 50%. Inhale steam for 10-15 minutes, 3-4 times a day, or as told by your health care  provider. You can do this in the bathroom while a hot shower is running. Limit your exposure to cool or dry air. Rest Rest as much as possible. Sleep with your head raised (elevated). Make sure you get enough sleep each night. General instructions  Apply a warm, moist washcloth to your face 3-4 times a day or as told by your health care provider. This will help with discomfort. Use nasal saline washes as often as told by your health care provider. Wash your hands often with soap and water to reduce your exposure to germs. If soap and water are not available, use hand sanitizer. Do not smoke. Avoid being around people who are smoking (secondhand smoke). Keep all follow-up visits. This is important. Contact a health care provider if: You have a fever. Your symptoms get worse. Your symptoms do not  improve within 10 days. Get help right away if: You have a severe headache. You have persistent vomiting. You have severe pain or swelling around your face or eyes. You have vision problems. You develop confusion. Your neck is stiff. You have trouble breathing. These symptoms may be an emergency. Get help right away. Call 911. Do not wait to see if the symptoms will go away. Do not drive yourself to the hospital. Summary A sinus infection is soreness and inflammation of your sinuses. Sinuses are hollow spaces in the bones around your face. This condition is caused by nasal tissues that become inflamed or swollen. The swelling traps or blocks the flow of mucus. This allows bacteria, viruses, and fungi to grow, which leads to infection. If you were prescribed an antibiotic medicine, take it as told by your health care provider. Do not stop taking the antibiotic even if you start to feel better. Keep all follow-up visits. This is important. This information is not intended to replace advice given to you by your health care provider. Make sure you discuss any questions you have with your health care provider. Document Revised: 06/03/2021 Document Reviewed: 06/03/2021 Elsevier Patient Education  2024 Elsevier Inc.   If you have been instructed to have an in-person evaluation today at a local Urgent Care facility, please use the link below. It will take you to a list of all of our available [facility], including address, phone number and hours of operation. Please do not delay care.  [facility]  If you or a family member do not have a primary care provider, use the link below to schedule a visit and establish care. When you choose a [facility] primary care physician or advanced practice provider, you gain a long-term partner in health. Find a Primary Care Provider  Learn more about [facility]'s in-office and virtual care options: [facility] Get [facility] Now

## 2023-09-21 ENCOUNTER — Other Ambulatory Visit: Payer: Self-pay | Admitting: Student

## 2023-09-21 DIAGNOSIS — I10 Essential (primary) hypertension: Secondary | ICD-10-CM

## 2023-09-21 NOTE — Telephone Encounter (Addendum)
 Amlodipine 5 mg discontinued on 08/26/23 Amlodipine 10 mg prescribed and last refilled on 08/26/23 with 3 refills

## 2023-09-23 ENCOUNTER — Other Ambulatory Visit: Payer: Self-pay | Admitting: Internal Medicine

## 2023-09-24 NOTE — Telephone Encounter (Signed)
 Medication sent to pharmacy

## 2023-09-25 ENCOUNTER — Telehealth: Admitting: Nurse Practitioner

## 2023-09-25 DIAGNOSIS — J069 Acute upper respiratory infection, unspecified: Secondary | ICD-10-CM | POA: Diagnosis not present

## 2023-09-25 MED ORDER — PREDNISONE 20 MG PO TABS: 40.0000 mg | ORAL_TABLET | Freq: Every day | ORAL | 0 refills | Status: AC

## 2023-09-25 MED ORDER — PSEUDOEPH-BROMPHEN-DM 30-2-10 MG/5ML PO SYRP: 5.0000 mL | ORAL_SOLUTION | Freq: Four times a day (QID) | ORAL | 0 refills | Status: DC | PRN

## 2023-09-25 NOTE — Patient Instructions (Signed)
 John [name], thank you for joining Claiborne Rigg, NP for today's virtual visit.  While this provider is not your primary care provider (PCP), if your PCP is located in our provider database this encounter information will be shared with them immediately following your visit.   A [facility] MyChart account gives you access to today's visit and all your visits, tests, and labs performed at [facility] " click here if you don't have a  MyChart account or go to [email or web address]  Consent: (Patient) John [name] provided verbal consent for this virtual visit at the beginning of the encounter.  Current Medications:  Current Outpatient Medications:    Accu-Chek Softclix Lancets lancets, USE CHECK GLUCOSE ONCE TO TWICE DAILY, Disp: 100 each, Rfl: 3   amLODipine (NORVASC) 10 MG tablet, Take 1 tablet (10 mg total) by mouth at bedtime., Disp: 90 tablet, Rfl: 3   amoxicillin-clavulanate (AUGMENTIN) 875-125 MG tablet, Take 1 tablet by mouth 2 (two) times daily., Disp: 14 tablet, Rfl: 0   BD PEN NEEDLE NANO 2ND GEN 32G X 4 MM MISC, USE NEEDLE WITH INSULIN PEN TO INJECT YOUR INSULIN AS DIRECTED, Disp: 100 each, Rfl: 3   Blood Glucose Monitoring Suppl (ACCU-CHEK GUIDE) w/Device KIT, Use to check blood sugars 1-2 times daily., Disp: 1 kit, Rfl: 0   brompheniramine-pseudoephedrine-DM 30-2-10 MG/5ML syrup, Take 5 mLs by mouth 4 (four) times daily as needed., Disp: 240 mL, Rfl: 0   diclofenac Sodium (VOLTAREN ARTHRITIS PAIN) 1 % GEL, Apply 4 g topically 4 (four) times daily., Disp: 4 g, Rfl: 0   Elastic Bandages & Supports (WRIST SPLINT/ELASTIC RIGHT XL) MISC, 1 Application by Does not apply route daily., Disp: 1 each, Rfl: 0   gabapentin (NEURONTIN) 100 MG capsule, Take 1 capsule (100 mg total) by mouth 3 (three) times daily., Disp: 90 capsule, Rfl: 1   glucose blood (ACCU-CHEK GUIDE TEST) test strip, Use to check blood sugar once to twice daily, Disp: 100 each, Rfl: 3    JARDIANCE 10 MG TABS tablet, TAKE 1 TABLET BY MOUTH ONCE DAILY BEFORE BREAKFAST, Disp: 30 tablet, Rfl: 0   LANTUS SOLOSTAR 100 UNIT/ML Solostar Pen, INJECT 25 UNITS SUBCUTANEOUSLY ONCE DAILY, Disp: 30 mL, Rfl: 0   losartan (COZAAR) 50 MG tablet, Take 1 tablet (50 mg total) by mouth daily., Disp: 90 tablet, Rfl: 3   OZEMPIC, 2 MG/DOSE, 8 MG/3ML SOPN, INJECT 2 MG INTO THE SKIN ONCE A WEEK, Disp: 3 mL, Rfl: 0   predniSONE (DELTASONE) 20 MG tablet, Take 2 tablets (40 mg total) by mouth daily with breakfast for 5 days., Disp: 10 tablet, Rfl: 0   rosuvastatin (CRESTOR) 20 MG tablet, Take 1 tablet (20 mg total) by mouth daily., Disp: 90 tablet, Rfl: 3   traMADol (ULTRAM) 50 MG tablet, Take 50 mg by mouth every 6 (six) hours as needed for moderate pain (pain score 4-6)., Disp: , Rfl:    Medications ordered in this encounter:  Meds ordered this encounter  Medications   predniSONE (DELTASONE) 20 MG tablet    Sig: Take 2 tablets (40 mg total) by mouth daily with breakfast for 5 days.    Dispense:  10 tablet    Refill:  0    Supervising Provider:   Merrilee Jansky [[id number]]   brompheniramine-pseudoephedrine-DM 30-2-10 MG/5ML syrup    Sig: Take 5 mLs by mouth 4 (four) times daily as needed.    Dispense:  240 mL    Refill:  0  Supervising Provider:   Merrilee Jansky [[id number]]     *If you need refills on other medications prior to your next appointment, please contact your pharmacy*  Follow-Up: Call back or seek an in-person evaluation if the symptoms worsen or if the condition fails to improve as anticipated.  [facility] [phone]  Other Instructions INSTRUCTIONS: use a humidifier for nasal congestion Drink plenty of fluids, rest and wash hands frequently to avoid the spread of infection Alternate tylenol and Motrin for relief of fever    If you have been instructed to have an in-person evaluation today at a local Urgent Care facility, please use the link below. It will  take you to a list of all of our available [facility], including address, phone number and hours of operation. Please do not delay care.  [facility]  If you or a family member do not have a primary care provider, use the link below to schedule a visit and establish care. When you choose a [facility] primary care physician or advanced practice provider, you gain a long-term partner in health. Find a Primary Care Provider  Learn more about [facility]'s in-office and virtual care options: [facility] Get Care Now

## 2023-09-25 NOTE — Progress Notes (Signed)
 Virtual Visit Consent   John [name], you are scheduled for a virtual visit with a [facility] provider today. Just as with appointments in the office, your consent must be obtained to participate. Your consent will be active for this visit and any virtual visit you may have with one of our providers in the next 365 days. If you have a MyChart account, a copy of this consent can be sent to you electronically.  As this is a virtual visit, video technology does not allow for your provider to perform a traditional examination. This may limit your provider's ability to fully assess your condition. If your provider identifies any concerns that need to be evaluated in person or the need to arrange testing (such as labs, EKG, etc.), we will make arrangements to do so. Although advances in technology are sophisticated, we cannot ensure that it will always work on either your end or our end. If the connection with a video visit is poor, the visit may have to be switched to a telephone visit. With either a video or telephone visit, we are not always able to ensure that we have a secure connection.  By engaging in this virtual visit, you consent to the provision of healthcare and authorize for your insurance to be billed (if applicable) for the services provided during this visit. Depending on your insurance coverage, you may receive a charge related to this service.  I need to obtain your verbal consent now. Are you willing to proceed with your visit today? John [name] has provided verbal consent on 09/25/2023 for a virtual visit (video or telephone). Claiborne Rigg, NP  Date: 09/25/2023 12:07 PM   Virtual Visit via Video Note   I, Claiborne Rigg, connected with  John [name]  ([id number], 1965-07-28) on 09/25/23 at 12:00 PM EDT by a video-enabled telemedicine application and verified that I am speaking with the correct person using two identifiers.  Location: Patient: Virtual Visit Location Patient:  Home Provider: Virtual Visit Location Provider: Home Office   I discussed the limitations of evaluation and management by telemedicine and the availability of in person appointments. The patient expressed understanding and agreed to proceed.    History of Present Illness: John [name] is a 58 y.o. who identifies as a male who was assigned male at birth, and is being seen today for URI with cough and congestion.  John [name] is currently being treated for sinusitis with Augmentin. Despite taking an antibiotic he feels his chest congestion is worsening along with cough and nasal congestion. Taking Flonase, robitussin, mucinex and coricidin with no relief of symptoms. Denis chest pain or fever.     Problems:  Patient Active Problem List   Diagnosis Date Noted   Wrist pain 08/26/2023   Shoulder pain, bilateral 01/11/2023   BPPV (benign paroxysmal positional vertigo) 01/11/2023   Viral upper respiratory infection 09/29/2022   Tachycardia 04/01/2022   Healthcare maintenance 12/03/2021   Erectile dysfunction 02/28/2020   Hyperlipidemia associated with type 2 diabetes mellitus (HCC) 10/18/2019   Type 2 diabetes mellitus with unspecified complications (HCC) 07/26/2019   Schizophrenia (HCC) 07/25/2019   Anxiety disorder 07/25/2019   OSA (obstructive sleep apnea) 08/17/2013   Morbid obesity (HCC) 08/17/2013   Hypertension 08/17/2013    Allergies: No Known Allergies Medications:  Current Outpatient Medications:    Accu-Chek Softclix Lancets lancets, USE CHECK GLUCOSE ONCE TO TWICE DAILY, Disp: 100 each, Rfl: 3   amLODipine (NORVASC) 10 MG tablet, Take  1 tablet (10 mg total) by mouth at bedtime., Disp: 90 tablet, Rfl: 3   amoxicillin-clavulanate (AUGMENTIN) 875-125 MG tablet, Take 1 tablet by mouth 2 (two) times daily., Disp: 14 tablet, Rfl: 0   BD PEN NEEDLE NANO 2ND GEN 32G X 4 MM MISC, USE NEEDLE WITH INSULIN PEN TO INJECT YOUR INSULIN AS DIRECTED, Disp: 100 each, Rfl: 3   Blood Glucose  Monitoring Suppl (ACCU-CHEK GUIDE) w/Device KIT, Use to check blood sugars 1-2 times daily., Disp: 1 kit, Rfl: 0   brompheniramine-pseudoephedrine-DM 30-2-10 MG/5ML syrup, Take 5 mLs by mouth 4 (four) times daily as needed., Disp: 240 mL, Rfl: 0   diclofenac Sodium (VOLTAREN ARTHRITIS PAIN) 1 % GEL, Apply 4 g topically 4 (four) times daily., Disp: 4 g, Rfl: 0   Elastic Bandages & Supports (WRIST SPLINT/ELASTIC RIGHT XL) MISC, 1 Application by Does not apply route daily., Disp: 1 each, Rfl: 0   gabapentin (NEURONTIN) 100 MG capsule, Take 1 capsule (100 mg total) by mouth 3 (three) times daily., Disp: 90 capsule, Rfl: 1   glucose blood (ACCU-CHEK GUIDE TEST) test strip, Use to check blood sugar once to twice daily, Disp: 100 each, Rfl: 3   JARDIANCE 10 MG TABS tablet, TAKE 1 TABLET BY MOUTH ONCE DAILY BEFORE BREAKFAST, Disp: 30 tablet, Rfl: 0   LANTUS SOLOSTAR 100 UNIT/ML Solostar Pen, INJECT 25 UNITS SUBCUTANEOUSLY ONCE DAILY, Disp: 30 mL, Rfl: 0   losartan (COZAAR) 50 MG tablet, Take 1 tablet (50 mg total) by mouth daily., Disp: 90 tablet, Rfl: 3   OZEMPIC, 2 MG/DOSE, 8 MG/3ML SOPN, INJECT 2 MG INTO THE SKIN ONCE A WEEK, Disp: 3 mL, Rfl: 0   predniSONE (DELTASONE) 20 MG tablet, Take 2 tablets (40 mg total) by mouth daily with breakfast for 5 days., Disp: 10 tablet, Rfl: 0   rosuvastatin (CRESTOR) 20 MG tablet, Take 1 tablet (20 mg total) by mouth daily., Disp: 90 tablet, Rfl: 3   traMADol (ULTRAM) 50 MG tablet, Take 50 mg by mouth every 6 (six) hours as needed for moderate pain (pain score 4-6)., Disp: , Rfl:   Observations/Objective: Patient is well-developed, well-nourished in no acute distress.  Resting comfortably at home.  Head is normocephalic, atraumatic.  No labored breathing.  Speech is clear and coherent with logical content.  Patient is alert and oriented at baseline.    Assessment and Plan: 1. URI with cough and congestion (Primary) - predniSONE (DELTASONE) 20 MG tablet; Take 2  tablets (40 mg total) by mouth daily with breakfast for 5 days.  Dispense: 10 tablet; Refill: 0 - brompheniramine-pseudoephedrine-DM 30-2-10 MG/5ML syrup; Take 5 mLs by mouth 4 (four) times daily as needed.  Dispense: 240 mL; Refill: 0  INSTRUCTIONS: use a humidifier for nasal congestion Drink plenty of fluids, rest and wash hands frequently to avoid the spread of infection Alternate tylenol and Motrin for relief of fever   Follow Up Instructions: I discussed the assessment and treatment plan with the patient. The patient was provided an opportunity to ask questions and all were answered. The patient agreed with the plan and demonstrated an understanding of the instructions.  A copy of instructions were sent to the patient via MyChart unless otherwise noted below.    The patient was advised to call back or seek an in-person evaluation if the symptoms worsen or if the condition fails to improve as anticipated.    Claiborne Rigg, NP

## 2023-09-28 DIAGNOSIS — G4733 Obstructive sleep apnea (adult) (pediatric): Secondary | ICD-10-CM | POA: Diagnosis not present

## 2023-09-30 ENCOUNTER — Telehealth: Admitting: Physician Assistant

## 2023-09-30 DIAGNOSIS — I872 Venous insufficiency (chronic) (peripheral): Secondary | ICD-10-CM

## 2023-09-30 NOTE — Patient Instructions (Signed)
 John [name], thank you for joining [name], PA-C for today's virtual visit.  While this provider is not your primary care provider (PCP), if your PCP is located in our provider database this encounter information will be shared with them immediately following your visit.   A [facility] MyChart account gives you access to today's visit and all your visits, tests, and labs performed at [facility] " click here if you don't have a [facility] MyChart account or go to [email or web address]  Consent: (Patient) John [name] provided verbal consent for this virtual visit at the beginning of the encounter.  Current Medications:  Current Outpatient Medications:    Accu-Chek Softclix Lancets lancets, USE CHECK GLUCOSE ONCE TO TWICE DAILY, Disp: 100 each, Rfl: 3   amLODipine (NORVASC) 10 MG tablet, Take 1 tablet (10 mg total) by mouth at bedtime., Disp: 90 tablet, Rfl: 3   amoxicillin-clavulanate (AUGMENTIN) 875-125 MG tablet, Take 1 tablet by mouth 2 (two) times daily., Disp: 14 tablet, Rfl: 0   BD PEN NEEDLE NANO 2ND GEN 32G X 4 MM MISC, USE NEEDLE WITH INSULIN PEN TO INJECT YOUR INSULIN AS DIRECTED, Disp: 100 each, Rfl: 3   Blood Glucose Monitoring Suppl (ACCU-CHEK GUIDE) w/Device KIT, Use to check blood sugars 1-2 times daily., Disp: 1 kit, Rfl: 0   brompheniramine-pseudoephedrine-DM 30-2-10 MG/5ML syrup, Take 5 mLs by mouth 4 (four) times daily as needed., Disp: 240 mL, Rfl: 0   diclofenac Sodium (VOLTAREN ARTHRITIS PAIN) 1 % GEL, Apply 4 g topically 4 (four) times daily., Disp: 4 g, Rfl: 0   Elastic Bandages & Supports (WRIST SPLINT/ELASTIC RIGHT XL) MISC, 1 Application by Does not apply route daily., Disp: 1 each, Rfl: 0   gabapentin (NEURONTIN) 100 MG capsule, Take 1 capsule (100 mg total) by mouth 3 (three) times daily., Disp: 90 capsule, Rfl: 1   glucose blood (ACCU-CHEK GUIDE TEST) test strip, Use to check blood sugar once to twice daily, Disp: 100 each, Rfl: 3    JARDIANCE 10 MG TABS tablet, TAKE 1 TABLET BY MOUTH ONCE DAILY BEFORE BREAKFAST, Disp: 30 tablet, Rfl: 0   LANTUS SOLOSTAR 100 UNIT/ML Solostar Pen, INJECT 25 UNITS SUBCUTANEOUSLY ONCE DAILY, Disp: 30 mL, Rfl: 0   losartan (COZAAR) 50 MG tablet, Take 1 tablet (50 mg total) by mouth daily., Disp: 90 tablet, Rfl: 3   OZEMPIC, 2 MG/DOSE, 8 MG/3ML SOPN, INJECT 2 MG INTO THE SKIN ONCE A WEEK, Disp: 3 mL, Rfl: 0   predniSONE (DELTASONE) 20 MG tablet, Take 2 tablets (40 mg total) by mouth daily with breakfast for 5 days., Disp: 10 tablet, Rfl: 0   rosuvastatin (CRESTOR) 20 MG tablet, Take 1 tablet (20 mg total) by mouth daily., Disp: 90 tablet, Rfl: 3   traMADol (ULTRAM) 50 MG tablet, Take 50 mg by mouth every 6 (six) hours as needed for moderate pain (pain score 4-6)., Disp: , Rfl:    Medications ordered in this encounter:  No orders of the defined types were placed in this encounter.    *If you need refills on other medications prior to your next appointment, please contact your pharmacy*  Follow-Up: Call back or seek an in-person evaluation if the symptoms worsen or if the condition fails to improve as anticipated.  [facility] [phone]  Other Instructions  Chronic Venous Insufficiency Chronic venous insufficiency is a condition that causes the veins in the legs to struggle to pump blood from the legs to the heart.  It is also called venous stasis. This condition can happen when the vein walls are stretched, weakened, or damaged. It can also happen when the valves inside the vein are damaged. With the right treatment, you should be able to still lead an active life. What are the causes? Common causes of this condition include: Venous hypertension. This is high blood pressure inside the veins. Sitting or standing too long. This can cause increased blood pressure in the veins of the leg. Deep vein thrombosis (DVT). This is a blood clot that blocks blood flow in a  vein. Phlebitis. This is inflammation of a vein. It can cause a blood clot to form. An abnormal growth of cells (tumor) in the area between your hip bones (pelvis). This can cause blood to back up. What increases the risk? Factors that may make you more likely to get this condition include: Having a family history of the condition. Being overweight. Being pregnant. Not getting enough exercise. Smoking. Having a job that requires you to sit or stand in one place for a long time. Being a certain age. Females in their 61s and 37s and males in their 54s are more likely to get this condition. What are the signs or symptoms? Symptoms of this condition include: Varicose veins. These are veins that are enlarged, bulging, or twisted. Skin breakdown or ulcers. Reddened skin or dark discoloration of the skin on the leg between the knee and ankle. Lipodermatosclerosis. This is brown, smooth, tight, and painful skin just above the ankle. It is often on the inside of the leg. Swelling of the legs. How is this diagnosed? This condition may be diagnosed based on your medical history and a physical exam. You may also need tests, such as: A duplex ultrasound. This shows how blood flows through a blood vessel. Plethysmography. This tests blood flow. Venogram. This looks at the veins using an X-ray and dye. How is this treated? The goals of treatment are to help you return to an active life and to relieve pain. Treatment may include: Wearing compression stockings. These do not cure the condition but can help relieve symptoms. They can also help stop your condition from getting worse. Sclerotherapy. This involves injecting a solution to shrink damaged veins. Surgery. This may include: Vein stripping. This is when a diseased vein is taken out. Laser ablation surgery. This is when blood flow is cut off through the vein. Repairing or remaking a valve inside the affected vein. Follow these instructions at  home: Lifestyle Do not use any products that contain nicotine or tobacco. These products include cigarettes, chewing tobacco, and vaping devices, such as e-cigarettes. If you need help quitting, ask your health care provider. Stay active. Exercise, walk, or do other activities. Ask your provider what activities are safe for you. General instructions Take over-the-counter and prescription medicines only as told by your provider. Drink enough fluid to keep your pee (urine) pale yellow. Wear compression stockings as told by your provider. These stockings help to prevent blood clots and reduce swelling in your legs. Keep all follow-up visits. Your provider will check your legs for any changes and adjust your treatment plan as needed. Contact a health care provider if: You have redness, swelling, or more pain in the affected area. You see a red streak or line that goes up or down from the area. You have skin breakdown or skin loss. You get an injury in the affected area. You get a fever. Get help right away if: You  have severe pain that does not get better with medicine. You get an injury and an open wound in the affected area. Your foot or ankle becomes numb or weak all of a sudden. You have trouble moving your foot or ankle. Your symptoms do not go away or get worse. You have chest pain. You have shortness of breath. These symptoms may be an emergency. Get help right away. Call 911. Do not wait to see if the symptoms will go away. Do not drive yourself to the hospital. This information is not intended to replace advice given to you by your health care provider. Make sure you discuss any questions you have with your health care provider. Document Revised: 07/14/2022 Document Reviewed: 07/14/2022 Elsevier Patient Education  2024 Elsevier Inc.   If you have been instructed to have an in-person evaluation today at a local Urgent Care facility, please use the link below. It will take you to a  list of all of our available [facility], including address, phone number and hours of operation. Please do not delay care.  [facility]  If you or a family member do not have a primary care provider, use the link below to schedule a visit and establish care. When you choose a [facility] primary care physician or advanced practice provider, you gain a long-term partner in health. Find a Primary Care Provider  Learn more about [facility]'s in-office and virtual care options:  - Get Care Now

## 2023-09-30 NOTE — Progress Notes (Signed)
 Virtual Visit Consent   EDAHI KROENING, you are scheduled for a virtual visit with a [facility] provider today. Just as with appointments in the office, your consent must be obtained to participate. Your consent will be active for this visit and any virtual visit you may have with one of our providers in the next 365 days. If you have a MyChart account, a copy of this consent can be sent to you electronically.  As this is a virtual visit, video technology does not allow for your provider to perform a traditional examination. This may limit your provider's ability to fully assess your condition. If your provider identifies any concerns that need to be evaluated in person or the need to arrange testing (such as labs, EKG, etc.), we will make arrangements to do so. Although advances in technology are sophisticated, we cannot ensure that it will always work on either your end or our end. If the connection with a video visit is poor, the visit may have to be switched to a telephone visit. With either a video or telephone visit, we are not always able to ensure that we have a secure connection.  By engaging in this virtual visit, you consent to the provision of healthcare and authorize for your insurance to be billed (if applicable) for the services provided during this visit. Depending on your insurance coverage, you may receive a charge related to this service.  I need to obtain your verbal consent now. Are you willing to proceed with your visit today? John [name] has provided verbal consent on 09/30/2023 for a virtual visit (video or telephone). [name], PA-C  Date: 09/30/2023 9:25 AM   Virtual Visit via Video Note   I, [name], connected with  John [name]  ([id number], Aug 11, 1965) on 09/30/23 at  9:15 AM EDT by a video-enabled telemedicine application and verified that I am speaking with the correct person using two identifiers.  Location: Patient: Virtual Visit Location  Patient: Home Provider: Virtual Visit Location Provider: Home Office   I discussed the limitations of evaluation and management by telemedicine and the availability of in person appointments. The patient expressed understanding and agreed to proceed.    History of Present Illness: John [name] is a 58 y.o. who identifies as a male who was assigned male at birth, and is being seen today for swelling in right leg only. Swells on the right lower leg from the knee to ankle. Does come and go, some days are better than others. Does go down overnight then will increasingly swell through the day as he is up on it. Denies any pain, redness, sores.    Problems:  Patient Active Problem List   Diagnosis Date Noted   Wrist pain 08/26/2023   Shoulder pain, bilateral 01/11/2023   BPPV (benign paroxysmal positional vertigo) 01/11/2023   Viral upper respiratory infection 09/29/2022   Tachycardia 04/01/2022   Healthcare maintenance 12/03/2021   Erectile dysfunction 02/28/2020   Hyperlipidemia associated with type 2 diabetes mellitus (HCC) 10/18/2019   Type 2 diabetes mellitus with unspecified complications (HCC) 07/26/2019   Schizophrenia (HCC) 07/25/2019   Anxiety disorder 07/25/2019   OSA (obstructive sleep apnea) 08/17/2013   Morbid obesity (HCC) 08/17/2013   Hypertension 08/17/2013    Allergies: No Known Allergies Medications:  Current Outpatient Medications:    Accu-Chek Softclix Lancets lancets, USE CHECK GLUCOSE ONCE TO TWICE DAILY, Disp: 100 each, Rfl: 3   amLODipine (NORVASC) 10 MG tablet, Take 1  tablet (10 mg total) by mouth at bedtime., Disp: 90 tablet, Rfl: 3   amoxicillin-clavulanate (AUGMENTIN) 875-125 MG tablet, Take 1 tablet by mouth 2 (two) times daily., Disp: 14 tablet, Rfl: 0   BD PEN NEEDLE NANO 2ND GEN 32G X 4 MM MISC, USE NEEDLE WITH INSULIN PEN TO INJECT YOUR INSULIN AS DIRECTED, Disp: 100 each, Rfl: 3   Blood Glucose Monitoring Suppl (ACCU-CHEK GUIDE) w/Device KIT, Use to  check blood sugars 1-2 times daily., Disp: 1 kit, Rfl: 0   brompheniramine-pseudoephedrine-DM 30-2-10 MG/5ML syrup, Take 5 mLs by mouth 4 (four) times daily as needed., Disp: 240 mL, Rfl: 0   diclofenac Sodium (VOLTAREN ARTHRITIS PAIN) 1 % GEL, Apply 4 g topically 4 (four) times daily., Disp: 4 g, Rfl: 0   Elastic Bandages & Supports (WRIST SPLINT/ELASTIC RIGHT XL) MISC, 1 Application by Does not apply route daily., Disp: 1 each, Rfl: 0   gabapentin (NEURONTIN) 100 MG capsule, Take 1 capsule (100 mg total) by mouth 3 (three) times daily., Disp: 90 capsule, Rfl: 1   glucose blood (ACCU-CHEK GUIDE TEST) test strip, Use to check blood sugar once to twice daily, Disp: 100 each, Rfl: 3   JARDIANCE 10 MG TABS tablet, TAKE 1 TABLET BY MOUTH ONCE DAILY BEFORE BREAKFAST, Disp: 30 tablet, Rfl: 0   LANTUS SOLOSTAR 100 UNIT/ML Solostar Pen, INJECT 25 UNITS SUBCUTANEOUSLY ONCE DAILY, Disp: 30 mL, Rfl: 0   losartan (COZAAR) 50 MG tablet, Take 1 tablet (50 mg total) by mouth daily., Disp: 90 tablet, Rfl: 3   OZEMPIC, 2 MG/DOSE, 8 MG/3ML SOPN, INJECT 2 MG INTO THE SKIN ONCE A WEEK, Disp: 3 mL, Rfl: 0   predniSONE (DELTASONE) 20 MG tablet, Take 2 tablets (40 mg total) by mouth daily with breakfast for 5 days., Disp: 10 tablet, Rfl: 0   rosuvastatin (CRESTOR) 20 MG tablet, Take 1 tablet (20 mg total) by mouth daily., Disp: 90 tablet, Rfl: 3   traMADol (ULTRAM) 50 MG tablet, Take 50 mg by mouth every 6 (six) hours as needed for moderate pain (pain score 4-6)., Disp: , Rfl:   Observations/Objective: Patient is well-developed, well-nourished in no acute distress.  Resting comfortably at home.  Head is normocephalic, atraumatic.  No labored breathing.  Speech is clear and coherent with logical content.  Patient is alert and oriented at baseline.    Assessment and Plan: 1. Venous insufficiency of right lower extremity (Primary)  - Suspect venous insufficiency - Advised to wear compression stockings during the  day, remove at night when sleeping, and elevate legs when at rest - May use tylenol or Ibuprofen as needed for pain - Discussed to follow up with PCP for further evaluation and consideration of referral to vein and vascular clinic if continues to swell without improvement - Patient is on Amlodipine as well, considerations to change if swelling continues to worsen - Seek further evaluation in person if worsening or fails to improve  Follow Up Instructions: I discussed the assessment and treatment plan with the patient. The patient was provided an opportunity to ask questions and all were answered. The patient agreed with the plan and demonstrated an understanding of the instructions.  A copy of instructions were sent to the patient via MyChart unless otherwise noted below.    The patient was advised to call back or seek an in-person evaluation if the symptoms worsen or if the condition fails to improve as anticipated.    [name], PA-C

## 2023-10-01 ENCOUNTER — Other Ambulatory Visit: Payer: Self-pay | Admitting: Student

## 2023-10-04 NOTE — Telephone Encounter (Signed)
 Medication discontinued 08/26/23

## 2023-10-05 ENCOUNTER — Encounter: Payer: Self-pay | Admitting: Pediatrics

## 2023-10-07 ENCOUNTER — Other Ambulatory Visit: Payer: Self-pay | Admitting: Internal Medicine

## 2023-10-07 NOTE — Telephone Encounter (Signed)
 I don't see the medication on the patients medication list.. I will need a prior authorization request from the pharmacy. Can you send in a new rx please thanks.

## 2023-10-11 ENCOUNTER — Ambulatory Visit (AMBULATORY_SURGERY_CENTER)

## 2023-10-11 VITALS — Ht 71.0 in | Wt 352.0 lb

## 2023-10-11 DIAGNOSIS — Z1211 Encounter for screening for malignant neoplasm of colon: Secondary | ICD-10-CM

## 2023-10-11 MED ORDER — NA SULFATE-K SULFATE-MG SULF 17.5-3.13-1.6 GM/177ML PO SOLN: 1.0000 | Freq: Once | ORAL | 0 refills | Status: AC

## 2023-10-11 NOTE — Progress Notes (Signed)

## 2023-10-11 NOTE — Patient Instructions (Signed)
 [facility] has implemented a new process for scheduling procedures.  Please note your arrival time for the [facility] is your appointment time that is shown on your written instructions.  Please do not arrive one hour prior to the time listed in your instructions.  Please ignore any outside notifications to arrive one hour early.  We apologize for any confusion and look forward to seeing you for your procedure.

## 2023-10-20 ENCOUNTER — Ambulatory Visit

## 2023-10-20 DIAGNOSIS — Z0000 Encounter for general adult medical examination without abnormal findings: Secondary | ICD-10-CM

## 2023-10-20 NOTE — Patient Instructions (Signed)
 Health Maintenance, Male  Adopting a healthy lifestyle and getting preventive care are important in promoting health and wellness. Ask your health care provider about:  The right schedule for you to have regular tests and exams.  Things you can do on your own to prevent diseases and keep yourself healthy.  What should I know about diet, weight, and exercise?  Eat a healthy diet    Eat a diet that includes plenty of vegetables, fruits, low-fat dairy products, and lean protein.  Do not eat a lot of foods that are high in solid fats, added sugars, or sodium.  Maintain a healthy weight  Body mass index (BMI) is a measurement that can be used to identify possible weight problems. It estimates body fat based on height and weight. Your health care provider can help determine your BMI and help you achieve or maintain a healthy weight.  Get regular exercise  Get regular exercise. This is one of the most important things you can do for your health. Most adults should:  Exercise for at least 150 minutes each week. The exercise should increase your heart rate and make you sweat (moderate-intensity exercise).  Do strengthening exercises at least twice a week. This is in addition to the moderate-intensity exercise.  Spend less time sitting. Even light physical activity can be beneficial.  Watch cholesterol and blood lipids  Have your blood tested for lipids and cholesterol at 57 years of age, then have this test every 5 years.  You may need to have your cholesterol levels checked more often if:  Your lipid or cholesterol levels are high.  You are older than 57 years of age.  You are at high risk for heart disease.  What should I know about cancer screening?  Many types of cancers can be detected early and may often be prevented. Depending on your health history and family history, you may need to have cancer screening at various ages. This may include screening for:  Colorectal cancer.  Prostate cancer.  Skin cancer.  Lung  cancer.  What should I know about heart disease, diabetes, and high blood pressure?  Blood pressure and heart disease  High blood pressure causes heart disease and increases the risk of stroke. This is more likely to develop in people who have high blood pressure readings or are overweight.  Talk with your health care provider about your target blood pressure readings.  Have your blood pressure checked:  Every 3-5 years if you are 9-95 years of age.  Every year if you are 85 years old or older.  If you are between the ages of 29 and 29 and are a current or former smoker, ask your health care provider if you should have a one-time screening for abdominal aortic aneurysm (AAA).  Diabetes  Have regular diabetes screenings. This checks your fasting blood sugar level. Have the screening done:  Once every three years after age 23 if you are at a normal weight and have a low risk for diabetes.  More often and at a younger age if you are overweight or have a high risk for diabetes.  What should I know about preventing infection?  Hepatitis B  If you have a higher risk for hepatitis B, you should be screened for this virus. Talk with your health care provider to find out if you are at risk for hepatitis B infection.  Hepatitis C  Blood testing is recommended for:  Everyone born from [birth year] through 1964.  Anyone  with known risk factors for hepatitis C.  Sexually transmitted infections (STIs)  You should be screened each year for STIs, including gonorrhea and chlamydia, if:  You are sexually active and are younger than 57 years of age.  You are older than 57 years of age and your health care provider tells you that you are at risk for this type of infection.  Your sexual activity has changed since you were last screened, and you are at increased risk for chlamydia or gonorrhea. Ask your health care provider if you are at risk.  Ask your health care provider about whether you are at high risk for HIV. Your health care provider  may recommend a prescription medicine to help prevent HIV infection. If you choose to take medicine to prevent HIV, you should first get tested for HIV. You should then be tested every 3 months for as long as you are taking the medicine.  Follow these instructions at home:  Alcohol use  Do not drink alcohol if your health care provider tells you not to drink.  If you drink alcohol:  Limit how much you have to 0-2 drinks a day.  Know how much alcohol is in your drink. In the [place] one drink equals one 12 oz bottle of beer (355 mL), one 5 oz glass of wine (148 mL), or one 1 oz glass of hard liquor (44 mL).  Lifestyle  Do not use any products that contain nicotine or tobacco. These products include cigarettes, chewing tobacco, and vaping devices, such as e-cigarettes. If you need help quitting, ask your health care provider.  Do not use street drugs.  Do not share needles.  Ask your health care provider for help if you need support or information about quitting drugs.  General instructions  Schedule regular health, dental, and eye exams.  Stay current with your vaccines.  Tell your health care provider if:  You often feel depressed.  You have ever been abused or do not feel safe at home.  Summary  Adopting a healthy lifestyle and getting preventive care are important in promoting health and wellness.  Follow your health care provider's instructions about healthy diet, exercising, and getting tested or screened for diseases.  Follow your health care provider's instructions on monitoring your cholesterol and blood pressure.  This information is not intended to replace advice given to you by your health care provider. Make sure you discuss any questions you have with your health care provider.  Document Revised: 11/18/2020 Document Reviewed: 11/18/2020  Elsevier Patient Education  2024 [employer].

## 2023-10-20 NOTE — Progress Notes (Signed)
 Subjective:   John [name] is a 58 y.o. male who presents for Medicare Annual/Subsequent preventive examination.  Visit Complete: Virtual I connected with  John [name] on 10/20/23 by a audio enabled telemedicine application and verified that I am speaking with the correct person using two identifiers.  Patient Location: Home  Provider Location: Office/Clinic  I discussed the limitations of evaluation and management by telemedicine. The patient expressed understanding and agreed to proceed.  Vital Signs: Because this visit was a virtual/telehealth visit, some criteria may be missing or patient reported. Any vitals not documented were not able to be obtained and vitals that have been documented are patient reported.         Objective:    Today's Vitals   10/20/23 1518  PainSc: 8    There is no height or weight on file to calculate BMI.     10/20/2023    3:24 PM 08/26/2023    2:01 PM 09/29/2022    3:39 PM 09/29/2022    3:06 PM 04/01/2022    1:47 PM 12/03/2021    1:39 PM 07/29/2021   12:27 PM  Advanced Directives  Does Patient Have a Medical Advance Directive? No No No No No No No  Would patient like information on creating a medical advance directive? No - Patient declined No - Patient declined No - Patient declined No - Patient declined No - Patient declined No - Patient declined     Current Medications (verified) Outpatient Encounter Medications as of 10/20/2023  Medication Sig   Accu-Chek Softclix Lancets lancets USE CHECK GLUCOSE ONCE TO TWICE DAILY   amLODipine (NORVASC) 10 MG tablet Take 1 tablet (10 mg total) by mouth at bedtime.   BD PEN NEEDLE NANO 2ND GEN 32G X 4 MM MISC USE NEEDLE WITH INSULIN PEN TO INJECT YOUR INSULIN AS DIRECTED   Blood Glucose Monitoring Suppl (ACCU-CHEK GUIDE) w/Device KIT Use to check blood sugars 1-2 times daily.   cetirizine (ZYRTEC) 10 MG tablet Take 1 tablet by mouth daily.   diclofenac Sodium (VOLTAREN ARTHRITIS PAIN) 1 % GEL Apply 4 g  topically 4 (four) times daily.   Elastic Bandages & Supports (WRIST SPLINT/ELASTIC RIGHT XL) MISC 1 Application by Does not apply route daily.   fluticasone (FLONASE) 50 MCG/ACT nasal spray Place 2 sprays into both nostrils daily.   gabapentin (NEURONTIN) 100 MG capsule Take 1 capsule (100 mg total) by mouth 3 (three) times daily.   glucose blood (ACCU-CHEK GUIDE TEST) test strip Use to check blood sugar once to twice daily   JARDIANCE 10 MG TABS tablet TAKE 1 TABLET BY MOUTH ONCE DAILY BEFORE BREAKFAST   LANTUS SOLOSTAR 100 UNIT/ML Solostar Pen INJECT 25 UNITS SUBCUTANEOUSLY ONCE DAILY   losartan (COZAAR) 50 MG tablet Take 1 tablet (50 mg total) by mouth daily.   OZEMPIC, 2 MG/DOSE, 8 MG/3ML SOPN INJECT 2 MG INTO THE SKIN ONCE A WEEK   traMADol (ULTRAM) 50 MG tablet Take 50 mg by mouth every 6 (six) hours as needed for moderate pain (pain score 4-6).   rosuvastatin (CRESTOR) 20 MG tablet Take 1 tablet (20 mg total) by mouth daily.   No facility-administered encounter medications on file as of 10/20/2023.    Allergies (verified) Patient has no known allergies.   History: Past Medical History:  Diagnosis Date   Anxiety disorder    Erectile dysfunction    Hyperlipidemia    Hypertension    Obesity, morbid, BMI 50 or higher (HCC)  Poorly controlled type 2 diabetes mellitus (HCC)    Schizophrenia (HCC)    Sleep apnea    Past Surgical History:  Procedure Laterality Date   ANKLE SURGERY     APPENDECTOMY     Family History  Problem Relation Age of Onset   Breast cancer Mother    Alcohol abuse Father    Hypertension Sister    Breast cancer Sister    Hypertension Brother    Diabetes type II Brother    Colon cancer Paternal Uncle        possible but not sure   Colon polyps Neg Hx    Rectal cancer Neg Hx    Stomach cancer Neg Hx    Social History   Socioeconomic History   Marital status: Widowed    Spouse name: Not on file   Number of children: Not on file   Years of  education: Not on file   Highest education level: Not on file  Occupational History   Not on file  Tobacco Use   Smoking status: Never   Smokeless tobacco: Never  Vaping Use   Vaping status: Never Used  Substance and Sexual Activity   Alcohol use: Yes    Comment: rare in the last two years, 1x in 2-3 months   Drug use: Yes    Types: Marijuana    Comment: Occasional   Sexual activity: Yes  Other Topics Concern   Not on file  Social History Narrative   Not on file   Social Drivers of Health   Financial Resource Strain: Medium Risk (10/20/2023)   Overall Financial Resource Strain (CARDIA)    Difficulty of Paying Living Expenses: Somewhat hard  Food Insecurity: No Food Insecurity (10/20/2023)   Hunger Vital Sign    Worried About Running Out of Food in the Last Year: Never true    Ran Out of Food in the Last Year: Never true  Transportation Needs: Unmet Transportation Needs (10/20/2023)   PRAPARE - Transportation    Lack of Transportation (Medical): Yes    Lack of Transportation (Non-Medical): Yes  Physical Activity: Sufficiently Active (10/20/2023)   Exercise Vital Sign    Days of Exercise per Week: 7 days    Minutes of Exercise per Session: 30 min  Stress: Stress Concern Present (10/20/2023)   [employer] of Occupational Health - Occupational Stress Questionnaire    Feeling of Stress : Rather much  Social Connections: Moderately Isolated (10/20/2023)   Social Connection and Isolation Panel [NHANES]    Frequency of Communication with Friends and Family: More than three times a week    Frequency of Social Gatherings with Friends and Family: More than three times a week    Attends Religious Services: More than 4 times per year    Active Member of [employer] or Organizations: No    Attends Banker Meetings: Never    Marital Status: Widowed    Tobacco Counseling Counseling given: Not Answered   Clinical Intake:  Pre-visit preparation completed: Yes  Pain :  0-10 Pain Score: 8  Pain Type: Acute pain Pain Location: Neck Pain Orientation: Left Pain Descriptors / Indicators: Aching Pain Onset: 1 to 4 weeks ago     Diabetes: Yes CBG done?: No Did pt. bring in CBG monitor from home?: No  How often do you need to have someone help you when you read instructions, pamphlets, or other written materials from your doctor or pharmacy?: 1 - Never What is the last grade level  you completed in school?: 10 TH GRADE  Interpreter Needed?: No  Information entered by :: [facility] Gartley   Activities of Daily Living     No data to display          Patient Care Team: [name], MD as PCP - General (Internal Medicine)  Indicate any recent Medical Services you may have received from other than [facility] providers in the past year (date may be approximate).     Assessment:   This is a routine wellness examination for Alexis.  Hearing/Vision screen No results found.   Goals Addressed   None   Depression Screen    10/20/2023    3:21 PM 08/26/2023    2:01 PM 09/29/2022    3:37 PM 09/29/2022    3:26 PM 04/01/2022    3:17 PM 12/03/2021    1:38 PM 07/29/2021   12:27 PM  PHQ 2/9 Scores  PHQ - 2 Score 3 2 4 4 1 2 1   PHQ- 9 Score 6 6 13 13 11 7      Fall Risk    10/20/2023    3:24 PM 08/26/2023    2:00 PM 08/24/2023   10:16 AM 09/29/2022    3:40 PM 09/29/2022    3:06 PM  Fall Risk   Falls in the past year? 0 0 0 0 0  Number falls in past yr: 0 0  0 0  Injury with Fall? 0 0  0 0  Risk for fall due to : No Fall Risks Impaired balance/gait  No Fall Risks No Fall Risks  Follow up Falls prevention discussed;Falls evaluation completed   Falls evaluation completed Falls evaluation completed    MEDICARE RISK AT HOME: Medicare Risk at Home Any stairs in or around the home?: No If so, are there any without handrails?: No Home free of loose throw rugs in walkways, pet beds, electrical cords, etc?: No Adequate lighting in your home to reduce risk of  falls?: Yes Life alert?: No Use of a cane, walker or w/c?: Yes Grab bars in the bathroom?: Yes Shower chair or bench in shower?: No Elevated toilet seat or a handicapped toilet?: No  TIMED UP AND GO:  Was the test performed?  No    Cognitive Function:        10/20/2023    3:25 PM 09/29/2022    3:40 PM 07/29/2021   12:28 PM 07/23/2020    1:23 PM  6CIT Screen  What Year?  0 points 0 points 0 points  What month? 0 points 0 points 0 points 0 points  What time? 0 points 0 points 0 points   Count back from 20 0 points 0 points 0 points 0 points  Months in reverse 0 points 0 points 4 points 0 points  Repeat phrase 0 points 0 points 2 points 0 points  Total Score  0 points 6 points     Immunizations Immunization History  Administered Date(s) Administered   Influenza, Seasonal, Injecte, Preservative Fre 08/26/2023   Influenza,inj,Quad PF,6+ Mos 07/25/2019, 04/01/2022   PFIZER(Purple Top)SARS-COV-2 Vaccination 10/06/2019, 10/31/2019   PNEUMOCOCCAL CONJUGATE-20 04/01/2022   Pneumococcal Polysaccharide-23 02/28/2020    TDAP status: Due, Education has been provided regarding the importance of this vaccine. Advised may receive this vaccine at local pharmacy or Health Dept. Aware to provide a copy of the vaccination record if obtained from local pharmacy or Health Dept. Verbalized acceptance and understanding.  Flu Vaccine status: Up to date  Pneumococcal vaccine status: Up to  date  Covid-19 vaccine status: Completed vaccines  Qualifies for Shingles Vaccine? Yes   Zostavax completed No   Shingrix Completed?: No.    Education has been provided regarding the importance of this vaccine. Patient has been advised to call insurance company to determine out of pocket expense if they have not yet received this vaccine. Advised may also receive vaccine at local pharmacy or Health Dept. Verbalized acceptance and understanding.  Screening Tests Health Maintenance  Topic Date Due   HIV  Screening  Never done   Diabetic kidney evaluation - Urine ACR  Never done   DTaP/Tdap/Td (1 - Tdap) Never done   Zoster Vaccines- Shingrix (1 of 2) Never done   Colonoscopy  Never done   COVID-19 Vaccine (3 - Pfizer risk series) 11/28/2019   OPHTHALMOLOGY EXAM  03/21/2021   HEMOGLOBIN A1C  11/23/2023   Diabetic kidney evaluation - eGFR measurement  01/11/2024   INFLUENZA VACCINE  02/11/2024   FOOT EXAM  08/25/2024   Medicare Annual Wellness (AWV)  10/19/2024   Pneumococcal Vaccine 34-23 Years old  Completed   Hepatitis C Screening  Completed   HPV VACCINES  Aged Out    Health Maintenance  Health Maintenance Due  Topic Date Due   HIV Screening  Never done   Diabetic kidney evaluation - Urine ACR  Never done   DTaP/Tdap/Td (1 - Tdap) Never done   Zoster Vaccines- Shingrix (1 of 2) Never done   Colonoscopy  Never done   COVID-19 Vaccine (3 - Pfizer risk series) 11/28/2019   OPHTHALMOLOGY EXAM  03/21/2021    Colorectal cancer screening: Type of screening: Colonoscopy. Completed 11/04/2023. Repeat every 10 years  Lung Cancer Screening: (Low Dose CT Chest recommended if Age 70-80 years, 20 pack-year currently smoking OR have quit w/in 15years.) does not qualify.   Lung Cancer Screening Referral: DEFERRED  TO PCP  Additional Screening:  Hepatitis C Screening: does qualify; Completed 02/04/2021  Vision Screening: Recommended annual ophthalmology exams for early detection of glaucoma and other disorders of the eye. Is the patient up to date with their annual eye exam?  No  Who is the provider or what is the name of the office in which the patient attends annual eye exams? [facility]  If pt is not established with a provider, would they like to be referred to a provider to establish care? No .   Dental Screening: Recommended annual dental exams for proper oral hygiene  Diabetic Foot Exam: Diabetic Foot Exam: Completed 08/26/2023  Community Resource Referral / Chronic Care  Management: CRR required this visit?  No   CCM required this visit?  No     Plan:     I have personally reviewed and noted the following in the patient's chart:   Medical and social history Use of alcohol, tobacco or illicit drugs  Current medications and supplements including opioid prescriptions. Patient is currently taking opioid prescriptions. Information provided to patient regarding non-opioid alternatives. Patient advised to discuss non-opioid treatment plan with their provider. Functional ability and status Nutritional status Physical activity Advanced directives List of other physicians Hospitalizations, surgeries, and ER visits in previous 12 months Vitals Screenings to include cognitive, depression, and falls Referrals and appointments  In addition, I have reviewed and discussed with patient certain preventive protocols, quality metrics, and best practice recommendations. A written personalized care plan for preventive services as well as general preventive health recommendations were provided to patient.     Derrell Lolling,  CMA   10/20/2023   After Visit Summary: (Mail) Due to this being a telephonic visit, the after visit summary with patients personalized plan was offered to patient via mail   Nurse Notes: NON FACE TO FACE   Mr. Whisenant , Thank you for taking time to come for your Medicare Wellness Visit. I appreciate your ongoing commitment to your health goals. Please review the following plan we discussed and let me know if I can assist you in the future.   These are the goals we discussed:  Goals      Patient Stated     Continue to lose weight      Patient Stated     07/29/2021, wants to lose 50-60 pounds        This is a list of the screening recommended for you and due dates:  Health Maintenance  Topic Date Due   HIV Screening  Never done   Yearly kidney health urinalysis for diabetes  Never done   DTaP/Tdap/Td vaccine (1 - Tdap) Never done    Zoster (Shingles) Vaccine (1 of 2) Never done   Colon Cancer Screening  Never done   COVID-19 Vaccine (3 - Pfizer risk series) 11/28/2019   Eye exam for diabetics  03/21/2021   Hemoglobin A1C  11/23/2023   Yearly kidney function blood test for diabetes  01/11/2024   Flu Shot  02/11/2024   Complete foot exam   08/25/2024   Medicare Annual Wellness Visit  10/19/2024   Pneumococcal Vaccination  Completed   Hepatitis C Screening  Completed   HPV Vaccine  Aged Out

## 2023-10-29 DIAGNOSIS — G4733 Obstructive sleep apnea (adult) (pediatric): Secondary | ICD-10-CM | POA: Diagnosis not present

## 2023-10-30 ENCOUNTER — Telehealth: Admitting: Nurse Practitioner

## 2023-10-30 DIAGNOSIS — K219 Gastro-esophageal reflux disease without esophagitis: Secondary | ICD-10-CM

## 2023-10-30 MED ORDER — OMEPRAZOLE 20 MG PO CPDR: 20.0000 mg | DELAYED_RELEASE_CAPSULE | Freq: Every day | ORAL | 0 refills | Status: AC

## 2023-10-30 NOTE — Patient Instructions (Signed)
 John [name], thank you for joining Collins Dean, NP for today's virtual visit.  While this provider is not your primary care provider (PCP), if your PCP is located in our provider database this encounter information will be shared with them immediately following your visit.   A [facility] MyChart account gives you access to today's visit and all your visits, tests, and labs performed at [facility] " click here if you don't have a [place] MyChart account or go to [email or web address]  Consent: (Patient) John [name] provided verbal consent for this virtual visit at the beginning of the encounter.  Current Medications:  Current Outpatient Medications:    omeprazole  (PRILOSEC) 20 MG capsule, Take 1 capsule (20 mg total) by mouth daily before breakfast., Disp: 30 capsule, Rfl: 0   Accu-Chek Softclix Lancets lancets, USE CHECK GLUCOSE ONCE TO TWICE DAILY, Disp: 100 each, Rfl: 3   amLODipine  (NORVASC ) 10 MG tablet, Take 1 tablet (10 mg total) by mouth at bedtime., Disp: 90 tablet, Rfl: 3   BD PEN NEEDLE NANO 2ND GEN 32G X 4 MM MISC, USE NEEDLE WITH INSULIN  PEN TO INJECT YOUR INSULIN  AS DIRECTED, Disp: 100 each, Rfl: 3   Blood Glucose Monitoring Suppl (ACCU-CHEK GUIDE) w/Device KIT, Use to check blood sugars 1-2 times daily., Disp: 1 kit, Rfl: 0   cetirizine  (ZYRTEC ) 10 MG tablet, Take 1 tablet by mouth daily., Disp: , Rfl:    diclofenac  Sodium (VOLTAREN  ARTHRITIS PAIN) 1 % GEL, Apply 4 g topically 4 (four) times daily., Disp: 4 g, Rfl: 0   Elastic Bandages & Supports (WRIST SPLINT/ELASTIC RIGHT XL) MISC, 1 Application by Does not apply route daily., Disp: 1 each, Rfl: 0   fluticasone  (FLONASE ) 50 MCG/ACT nasal spray, Place 2 sprays into both nostrils daily., Disp: , Rfl:    gabapentin  (NEURONTIN ) 100 MG capsule, Take 1 capsule (100 mg total) by mouth 3 (three) times daily., Disp: 90 capsule, Rfl: 1   glucose blood (ACCU-CHEK GUIDE TEST) test strip, Use to check blood  sugar once to twice daily, Disp: 100 each, Rfl: 3   JARDIANCE  10 MG TABS tablet, TAKE 1 TABLET BY MOUTH ONCE DAILY BEFORE BREAKFAST, Disp: 30 tablet, Rfl: 0   LANTUS  SOLOSTAR 100 UNIT/ML Solostar Pen, INJECT 25 UNITS SUBCUTANEOUSLY ONCE DAILY, Disp: 30 mL, Rfl: 0   losartan  (COZAAR ) 50 MG tablet, Take 1 tablet (50 mg total) by mouth daily., Disp: 90 tablet, Rfl: 3   OZEMPIC , 2 MG/DOSE, 8 MG/3ML SOPN, INJECT 2 MG INTO THE SKIN ONCE A WEEK, Disp: 3 mL, Rfl: 0   rosuvastatin  (CRESTOR ) 20 MG tablet, Take 1 tablet (20 mg total) by mouth daily., Disp: 90 tablet, Rfl: 3   traMADol (ULTRAM) 50 MG tablet, Take 50 mg by mouth every 6 (six) hours as needed for moderate pain (pain score 4-6)., Disp: , Rfl:    Medications ordered in this encounter:  Meds ordered this encounter  Medications   omeprazole  (PRILOSEC) 20 MG capsule    Sig: Take 1 capsule (20 mg total) by mouth daily before breakfast.    Dispense:  30 capsule    Refill:  0    Supervising Provider:   Corine Dice [[id number]]     *If you need refills on other medications prior to your next appointment, please contact your pharmacy*  Follow-Up: Call back or seek an in-person evaluation if the symptoms worsen or if the condition fails to improve as anticipated.  [facility] [phone])  [phone]  Other Instructions INSTRUCTIONS: Avoid GERD Triggers: acidic, spicy or fried foods, caffeine, coffee, sodas,  alcohol and chocolate.     If you have been instructed to have an in-person evaluation today at a local Urgent Care facility, please use the link below. It will take you to a list of all of our available [facility], including address, phone number and hours of operation. Please do not delay care.  [facility]  If you or a family member do not have a primary care provider, use the link below to schedule a visit and establish care. When you choose a [facility] primary care physician or advanced practice  provider, you gain a long-term partner in health. Find a Primary Care Provider  Learn more about [facility]'s in-office and virtual care options:  - Get Care Now

## 2023-10-30 NOTE — Progress Notes (Signed)
 Virtual Visit Consent   John [name], you are scheduled for a virtual visit with a [facility] provider today. Just as with appointments in the office, your consent must be obtained to participate. Your consent will be active for this visit and any virtual visit you may have with one of our providers in the next 365 days. If you have a MyChart account, a copy of this consent can be sent to you electronically.  As this is a virtual visit, video technology does not allow for your provider to perform a traditional examination. This may limit your provider's ability to fully assess your condition. If your provider identifies any concerns that need to be evaluated in person or the need to arrange testing (such as labs, EKG, etc.), we will make arrangements to do so. Although advances in technology are sophisticated, we cannot ensure that it will always work on either your end or our end. If the connection with a video visit is poor, the visit may have to be switched to a telephone visit. With either a video or telephone visit, we are not always able to ensure that we have a secure connection.  By engaging in this virtual visit, you consent to the provision of healthcare and authorize for your insurance to be billed (if applicable) for the services provided during this visit. Depending on your insurance coverage, you may receive a charge related to this service.  I need to obtain your verbal consent now. Are you willing to proceed with your visit today? John [name] has provided verbal consent on 10/30/2023 for a virtual visit (video or telephone). John Dean, NP  Date: 10/30/2023 9:11 AM   Virtual Visit via Video Note   I, John [name], connected with  John [name]  ([id number], [birth year]) on 10/30/23 at  9:00 AM EDT by a video-enabled telemedicine application and verified that I am speaking with the correct person using two identifiers.  Location: Patient: Virtual Visit Location Patient:  Home Provider: Virtual Visit Location Provider: Home Office   I discussed the limitations of evaluation and management by telemedicine and the availability of in person appointments. The patient expressed understanding and agreed to proceed.    History of Present Illness: John [name] is a 58 y.o. who identifies as a male who was assigned male at birth, and is being seen today for GERD.  Over the past week [name] has been experiencing burning in his abdomen, epigastric pain, nausea and sour taste.  He has been taking chewable and liquid antacids over-the-counter with some relief however symptoms return after eating, lying down or early am.  He has taken omeprazole  in the past and would like to have this refilled today.  Problems:  Patient Active Problem List   Diagnosis Date Noted   Wrist pain 08/26/2023   Shoulder pain, bilateral 01/11/2023   BPPV (benign paroxysmal positional vertigo) 01/11/2023   Viral upper respiratory infection 09/29/2022   Tachycardia 04/01/2022   Healthcare maintenance 12/03/2021   Erectile dysfunction 02/28/2020   Hyperlipidemia associated with type 2 diabetes mellitus (HCC) 10/18/2019   Type 2 diabetes mellitus with unspecified complications (HCC) 07/26/2019   Schizophrenia (HCC) 07/25/2019   Anxiety disorder 07/25/2019   OSA (obstructive sleep apnea) 08/17/2013   Morbid obesity (HCC) 08/17/2013   Hypertension 08/17/2013    Allergies: No Known Allergies Medications:  Current Outpatient Medications:    omeprazole  (PRILOSEC) 20 MG capsule, Take 1 capsule (20 mg total) by mouth daily before  breakfast., Disp: 30 capsule, Rfl: 0   Accu-Chek Softclix Lancets lancets, USE CHECK GLUCOSE ONCE TO TWICE DAILY, Disp: 100 each, Rfl: 3   amLODipine  (NORVASC ) 10 MG tablet, Take 1 tablet (10 mg total) by mouth at bedtime., Disp: 90 tablet, Rfl: 3   BD PEN NEEDLE NANO 2ND GEN 32G X 4 MM MISC, USE NEEDLE WITH INSULIN  PEN TO INJECT YOUR INSULIN  AS DIRECTED, Disp: 100  each, Rfl: 3   Blood Glucose Monitoring Suppl (ACCU-CHEK GUIDE) w/Device KIT, Use to check blood sugars 1-2 times daily., Disp: 1 kit, Rfl: 0   cetirizine  (ZYRTEC ) 10 MG tablet, Take 1 tablet by mouth daily., Disp: , Rfl:    diclofenac  Sodium (VOLTAREN  ARTHRITIS PAIN) 1 % GEL, Apply 4 g topically 4 (four) times daily., Disp: 4 g, Rfl: 0   Elastic Bandages & Supports (WRIST SPLINT/ELASTIC RIGHT XL) MISC, 1 Application by Does not apply route daily., Disp: 1 each, Rfl: 0   fluticasone  (FLONASE ) 50 MCG/ACT nasal spray, Place 2 sprays into both nostrils daily., Disp: , Rfl:    gabapentin  (NEURONTIN ) 100 MG capsule, Take 1 capsule (100 mg total) by mouth 3 (three) times daily., Disp: 90 capsule, Rfl: 1   glucose blood (ACCU-CHEK GUIDE TEST) test strip, Use to check blood sugar once to twice daily, Disp: 100 each, Rfl: 3   JARDIANCE  10 MG TABS tablet, TAKE 1 TABLET BY MOUTH ONCE DAILY BEFORE BREAKFAST, Disp: 30 tablet, Rfl: 0   LANTUS  SOLOSTAR 100 UNIT/ML Solostar Pen, INJECT 25 UNITS SUBCUTANEOUSLY ONCE DAILY, Disp: 30 mL, Rfl: 0   losartan  (COZAAR ) 50 MG tablet, Take 1 tablet (50 mg total) by mouth daily., Disp: 90 tablet, Rfl: 3   OZEMPIC , 2 MG/DOSE, 8 MG/3ML SOPN, INJECT 2 MG INTO THE SKIN ONCE A WEEK, Disp: 3 mL, Rfl: 0   rosuvastatin  (CRESTOR ) 20 MG tablet, Take 1 tablet (20 mg total) by mouth daily., Disp: 90 tablet, Rfl: 3   traMADol (ULTRAM) 50 MG tablet, Take 50 mg by mouth every 6 (six) hours as needed for moderate pain (pain score 4-6)., Disp: , Rfl:   Observations/Objective: Patient is well-developed, well-nourished in no acute distress.  Resting comfortably at home.  Head is normocephalic, atraumatic.  No labored breathing.  Speech is clear and coherent with logical content.  Patient is alert and oriented at baseline.    Assessment and Plan: 1. GERD without esophagitis (Primary) - omeprazole  (PRILOSEC) 20 MG capsule; Take 1 capsule (20 mg total) by mouth daily before breakfast.   Dispense: 30 capsule; Refill: 0  INSTRUCTIONS: Avoid GERD Triggers: acidic, spicy or fried foods, caffeine, coffee, sodas,  alcohol and chocolate.    Follow Up Instructions: I discussed the assessment and treatment plan with the patient. The patient was provided an opportunity to ask questions and all were answered. The patient agreed with the plan and demonstrated an understanding of the instructions.  A copy of instructions were sent to the patient via MyChart unless otherwise noted below.   The patient was advised to call back or seek an in-person evaluation if the symptoms worsen or if the condition fails to improve as anticipated.    Noam Franzen W Yvanna Vidas, NP

## 2023-10-31 NOTE — Progress Notes (Deleted)
 [facility] History and Physical   Primary Care Physician:  [name], MD   Reason for Procedure:  Colorectal cancer screening  Plan:    Screening colonoscopy     HPI: John [name] is a 58 y.o. male undergoing screening colonoscopy for colorectal cancer screening.  This is the patient's first colonoscopy.  No family history of colon polyps.  A paternal uncle may have had colon cancer although not entirely confirmed.  Patient denies change in bowel habits or rectal bleeding.   Past Medical History:  Diagnosis Date   Anxiety disorder    Erectile dysfunction    Hyperlipidemia    Hypertension    Obesity, morbid, BMI 50 or higher (HCC)    Poorly controlled type 2 diabetes mellitus (HCC)    Schizophrenia (HCC)    Sleep apnea     Past Surgical History:  Procedure Laterality Date   ANKLE SURGERY     APPENDECTOMY      Prior to Admission medications   Medication Sig Start Date End Date Taking? Authorizing Provider  Accu-Chek Softclix Lancets lancets USE CHECK GLUCOSE ONCE TO TWICE DAILY 08/26/23   Alexander-Savino, [place], MD  amLODipine  (NORVASC ) 10 MG tablet Take 1 tablet (10 mg total) by mouth at bedtime. 08/26/23   Isabell Manzanilla, [place], MD  BD PEN NEEDLE NANO 2ND GEN 32G X 4 MM MISC USE NEEDLE WITH INSULIN  PEN TO INJECT YOUR INSULIN  AS DIRECTED 08/26/23   Alexander-Savino, [place], MD  Blood Glucose Monitoring Suppl (ACCU-CHEK GUIDE) w/Device KIT Use to check blood sugars 1-2 times daily. 03/26/23   [name], MD  cetirizine  (ZYRTEC ) 10 MG tablet Take 1 tablet by mouth daily.    [provider]  diclofenac  Sodium (VOLTAREN  ARTHRITIS PAIN) 1 % GEL Apply 4 g topically 4 (four) times daily. 08/26/23   Alexander-Savino, Maribel, MD  Elastic Bandages & Supports (WRIST SPLINT/ELASTIC RIGHT XL) MISC 1 Application by Does not apply route daily. 08/26/23   Alexander-Savino, [place], MD  fluticasone  (FLONASE ) 50 MCG/ACT nasal spray Place 2 sprays into both  nostrils daily. 10/01/23   [provider]  gabapentin  (NEURONTIN ) 100 MG capsule Take 1 capsule (100 mg total) by mouth 3 (three) times daily. 09/01/23 11/30/23  Cathey Clunes, MD  glucose blood (ACCU-CHEK GUIDE TEST) test strip Use to check blood sugar once to twice daily 08/26/23   Isabell Manzanilla, [place], MD  JARDIANCE  10 MG TABS tablet TAKE 1 TABLET BY MOUTH ONCE DAILY BEFORE BREAKFAST 07/19/23   [name], MD  LANTUS  SOLOSTAR 100 UNIT/ML Solostar Pen INJECT 25 UNITS SUBCUTANEOUSLY ONCE DAILY 08/27/23   [name], MD  losartan  (COZAAR ) 50 MG tablet Take 1 tablet (50 mg total) by mouth daily. 03/26/23 03/25/24  Driscilla George, MD  omeprazole  (PRILOSEC) 20 MG capsule Take 1 capsule (20 mg total) by mouth daily before breakfast. 10/30/23   Collins Dean, NP  OZEMPIC , 2 MG/DOSE, 8 MG/3ML SOPN INJECT 2 MG INTO THE SKIN ONCE A WEEK 09/24/23   [name], MD  rosuvastatin  (CRESTOR ) 20 MG tablet Take 1 tablet (20 mg total) by mouth daily. 10/19/22 10/19/23  [name], MD  traMADol (ULTRAM) 50 MG tablet Take 50 mg by mouth every 6 (six) hours as needed for moderate pain (pain score 4-6).    [provider]    Current Outpatient Medications  Medication Sig Dispense Refill   Accu-Chek Softclix Lancets lancets USE CHECK GLUCOSE ONCE TO TWICE DAILY 100 each 3   amLODipine  (NORVASC ) 10 MG tablet Take 1  tablet (10 mg total) by mouth at bedtime. 90 tablet 3   BD PEN NEEDLE NANO 2ND GEN 32G X 4 MM MISC USE NEEDLE WITH INSULIN  PEN TO INJECT YOUR INSULIN  AS DIRECTED 100 each 3   Blood Glucose Monitoring Suppl (ACCU-CHEK GUIDE) w/Device KIT Use to check blood sugars 1-2 times daily. 1 kit 0   cetirizine  (ZYRTEC ) 10 MG tablet Take 1 tablet by mouth daily.     diclofenac  Sodium (VOLTAREN  ARTHRITIS PAIN) 1 % GEL Apply 4 g topically 4 (four) times daily. 4 g 0   Elastic Bandages & Supports (WRIST SPLINT/ELASTIC RIGHT XL) MISC 1 Application by Does not apply route daily. 1 each 0    fluticasone  (FLONASE ) 50 MCG/ACT nasal spray Place 2 sprays into both nostrils daily.     gabapentin  (NEURONTIN ) 100 MG capsule Take 1 capsule (100 mg total) by mouth 3 (three) times daily. 90 capsule 1   glucose blood (ACCU-CHEK GUIDE TEST) test strip Use to check blood sugar once to twice daily 100 each 3   JARDIANCE  10 MG TABS tablet TAKE 1 TABLET BY MOUTH ONCE DAILY BEFORE BREAKFAST 30 tablet 0   LANTUS  SOLOSTAR 100 UNIT/ML Solostar Pen INJECT 25 UNITS SUBCUTANEOUSLY ONCE DAILY 30 mL 0   losartan  (COZAAR ) 50 MG tablet Take 1 tablet (50 mg total) by mouth daily. 90 tablet 3   omeprazole  (PRILOSEC) 20 MG capsule Take 1 capsule (20 mg total) by mouth daily before breakfast. 30 capsule 0   OZEMPIC , 2 MG/DOSE, 8 MG/3ML SOPN INJECT 2 MG INTO THE SKIN ONCE A WEEK 3 mL 0   rosuvastatin  (CRESTOR ) 20 MG tablet Take 1 tablet (20 mg total) by mouth daily. 90 tablet 3   traMADol (ULTRAM) 50 MG tablet Take 50 mg by mouth every 6 (six) hours as needed for moderate pain (pain score 4-6).     No current facility-administered medications for this visit.    Allergies as of 11/04/2023   (No Known Allergies)    Family History  Problem Relation Age of Onset   Breast cancer Mother    Alcohol abuse Father    Hypertension Sister    Breast cancer Sister    Hypertension Brother    Diabetes type II Brother    Colon cancer Paternal Uncle        possible but not sure   Colon polyps Neg Hx    Rectal cancer Neg Hx    Stomach cancer Neg Hx     Social History   Socioeconomic History   Marital status: Widowed    Spouse name: Not on file   Number of children: Not on file   Years of education: Not on file   Highest education level: Not on file  Occupational History   Not on file  Tobacco Use   Smoking status: Never   Smokeless tobacco: Never  Vaping Use   Vaping status: Never Used  Substance and Sexual Activity   Alcohol use: Yes    Comment: rare in the last two years, 1x in 2-3 months   Drug use:  Yes    Types: Marijuana    Comment: Occasional   Sexual activity: Yes  Other Topics Concern   Not on file  Social History Narrative   Not on file   Social Drivers of Health   Financial Resource Strain: Medium Risk (10/20/2023)   Overall Financial Resource Strain (CARDIA)    Difficulty of Paying Living Expenses: Somewhat hard  Food Insecurity: No Food Insecurity (  10/20/2023)   Hunger Vital Sign    Worried About Running Out of Food in the Last Year: Never true    Ran Out of Food in the Last Year: Never true  Transportation Needs: Unmet Transportation Needs (10/20/2023)   PRAPARE - Transportation    Lack of Transportation (Medical): Yes    Lack of Transportation (Non-Medical): Yes  Physical Activity: Sufficiently Active (10/20/2023)   Exercise Vital Sign    Days of Exercise per Week: 7 days    Minutes of Exercise per Session: 30 min  Stress: Stress Concern Present (10/20/2023)   [employer] of Occupational Health - Occupational Stress Questionnaire    Feeling of Stress : Rather much  Social Connections: Moderately Isolated (10/20/2023)   Social Connection and Isolation Panel [NHANES]    Frequency of Communication with Friends and Family: More than three times a week    Frequency of Social Gatherings with Friends and Family: More than three times a week    Attends Religious Services: More than 4 times per year    Active Member of [employer] or Organizations: No    Attends Banker Meetings: Never    Marital Status: Widowed  Intimate Partner Violence: Not At Risk (10/20/2023)   Humiliation, Afraid, Rape, and Kick questionnaire    Fear of Current or Ex-Partner: No    Emotionally Abused: No    Physically Abused: No    Sexually Abused: No    Review of Systems:  All other review of systems negative except as mentioned in the HPI.  Physical Exam: Vital signs There were no vitals taken for this visit.  General:   Alert,  Well-developed, well-nourished, pleasant and  cooperative in NAD Airway:  Mallampati  Lungs:  Clear throughout to auscultation.   Heart:  Regular rate and rhythm; no murmurs, clicks, rubs,  or gallops. Abdomen:  Soft, nontender and nondistended. Normal bowel sounds.   Neuro/Psych:  Normal mood and affect. A and O x 3  Eugenia Hess, MD [facility]

## 2023-11-02 DIAGNOSIS — G8929 Other chronic pain: Secondary | ICD-10-CM | POA: Diagnosis not present

## 2023-11-02 DIAGNOSIS — M25512 Pain in left shoulder: Secondary | ICD-10-CM | POA: Diagnosis not present

## 2023-11-04 ENCOUNTER — Telehealth: Payer: Self-pay | Admitting: Pediatrics

## 2023-11-04 ENCOUNTER — Encounter: Admitting: Pediatrics

## 2023-11-04 NOTE — Telephone Encounter (Signed)
 Good Morning Dr. Yvone Herd,  I called this patient at 7:15 am today to see if he was coming for his procedure. No one answered so I left a message from them to call if they were running late or if they needed to reschedule.  I will NO SHOW this patient.  [employer]

## 2023-11-07 ENCOUNTER — Other Ambulatory Visit: Payer: Self-pay | Admitting: Internal Medicine

## 2023-11-08 NOTE — Telephone Encounter (Signed)
 Medication sent to pharmacy

## 2023-11-18 NOTE — Progress Notes (Signed)
 Internal Medicine Clinic Attending  I reviewed the AWV findings of the medical professional who conducted the visit. I was present in the office suite and immediately available to provide assistance and direction throughout the time the service was provided.

## 2023-11-24 ENCOUNTER — Other Ambulatory Visit: Payer: Self-pay | Admitting: Internal Medicine

## 2023-11-24 DIAGNOSIS — E785 Hyperlipidemia, unspecified: Secondary | ICD-10-CM

## 2023-11-25 ENCOUNTER — Other Ambulatory Visit: Payer: Self-pay | Admitting: Internal Medicine

## 2023-11-25 ENCOUNTER — Encounter: Payer: 59 | Admitting: Student

## 2023-11-29 ENCOUNTER — Encounter: Admitting: Internal Medicine

## 2023-11-29 NOTE — Progress Notes (Deleted)
 CC: 3 month follow up  HPI:  [name] is a 58 y.o. with medical history of HTN, HLD, DMII, OSA presenting to [facility] for a 3 month follow up. Last seen on 08/2023.   Please see problem-based list for further details, assessments, and plans.  Past Medical History:  Diagnosis Date   Anxiety disorder    Erectile dysfunction    Hyperlipidemia    Hypertension    Obesity, morbid, BMI 50 or higher (HCC)    Poorly controlled type 2 diabetes mellitus (HCC)    Schizophrenia (HCC)    Sleep apnea     Current Outpatient Medications (Endocrine & Metabolic):    JARDIANCE  10 MG TABS tablet, TAKE 1 TABLET BY MOUTH ONCE DAILY BEFORE BREAKFAST   LANTUS  SOLOSTAR 100 UNIT/ML Solostar Pen, INJECT 25 UNITS SUBCUTANEOUSLY ONCE DAILY   OZEMPIC , 2 MG/DOSE, 8 MG/3ML SOPN, INJECT 2 MG INTO THE SKIN ONCE A WEEK  Current Outpatient Medications (Cardiovascular):    amLODipine  (NORVASC ) 10 MG tablet, Take 1 tablet (10 mg total) by mouth at bedtime.   losartan  (COZAAR ) 50 MG tablet, Take 1 tablet (50 mg total) by mouth daily.   rosuvastatin  (CRESTOR ) 20 MG tablet, Take 1 tablet by mouth once daily  Current Outpatient Medications (Respiratory):    cetirizine  (ZYRTEC ) 10 MG tablet, Take 1 tablet by mouth daily.   fluticasone  (FLONASE ) 50 MCG/ACT nasal spray, Place 2 sprays into both nostrils daily.  Current Outpatient Medications (Analgesics):    traMADol (ULTRAM) 50 MG tablet, Take 50 mg by mouth every 6 (six) hours as needed for moderate pain (pain score 4-6).   Current Outpatient Medications (Other):    Accu-Chek Softclix Lancets lancets, USE CHECK GLUCOSE ONCE TO TWICE DAILY   BD PEN NEEDLE NANO 2ND GEN 32G X 4 MM MISC, USE NEEDLE WITH INSULIN  PEN TO INJECT YOUR INSULIN  AS DIRECTED   Blood Glucose Monitoring Suppl (ACCU-CHEK GUIDE) w/Device KIT, Use to check blood sugars 1-2 times daily.   diclofenac  Sodium (VOLTAREN  ARTHRITIS PAIN) 1 % GEL, Apply 4 g topically 4 (four) times daily.   Elastic  Bandages & Supports (WRIST SPLINT/ELASTIC RIGHT XL) MISC, 1 Application by Does not apply route daily.   gabapentin  (NEURONTIN ) 100 MG capsule, Take 1 capsule (100 mg total) by mouth 3 (three) times daily.   glucose blood (ACCU-CHEK GUIDE TEST) test strip, Use to check blood sugar once to twice daily   omeprazole  (PRILOSEC) 20 MG capsule, Take 1 capsule (20 mg total) by mouth daily before breakfast.  Review of Systems:  Review of system negative unless stated in the problem list or HPI.    Physical Exam:  There were no vitals filed for this visit. Physical Exam General: NAD HENT: NCAT Lungs: CTAB, no wheeze, rhonchi or rales.  Cardiovascular: Normal heart sounds, no r/m/g, 2+ pulses in all extremities. No LE edema Abdomen: No TTP, normal bowel sounds MSK: No asymmetry or muscle atrophy.  Skin: no lesions noted on exposed skin Neuro: Alert and oriented x4. CN grossly intact Psych: Normal mood and normal affect   Assessment & Plan:   No problem-specific Assessment & Plan notes found for this encounter.   See Encounters Tab for problem based charting.  Patient Discussed with Dr. {NAMES:[id number]::"Guilloud","Hoffman","Mullen","Narendra","Vincent","Machen","Lau","Hatcher","Williams"} [name], MD Tom[facility] Fragmin. [facility] Internal Medicine Residency, PGY-3   HTN On amlodipine  10 mg every day, losartan  50 mg every day. BMP today. Last one year ago was wnl.   DMII Jardiance  10 mg every day,  Ozempic  2 mg qweekly, lantus  25 units daily   HLD On crestor  20 mg every day. Lipid panel today.   Care Gaps A1c HIV ACR Colonoscopy

## 2023-11-29 NOTE — Telephone Encounter (Signed)
 Medication sent to pharmacy

## 2023-12-02 ENCOUNTER — Telehealth: Admitting: Physician Assistant

## 2023-12-02 DIAGNOSIS — Z9109 Other allergy status, other than to drugs and biological substances: Secondary | ICD-10-CM

## 2023-12-02 NOTE — Patient Instructions (Signed)
 John [name], thank you for joining John Gutta, PA-C for today's virtual visit.  While this provider is not your primary care provider (PCP), if your PCP is located in our provider database this encounter information will be shared with them immediately following your visit.   A [facility] MyChart account gives you access to today's visit and all your visits, tests, and labs performed at [facility] " click here if you don't have a [facility] MyChart account or go to [email or web address]  Consent: (Patient) John [name] provided verbal consent for this virtual visit at the beginning of the encounter.  Current Medications:  Current Outpatient Medications:    JARDIANCE  10 MG TABS tablet, TAKE 1 TABLET BY MOUTH ONCE DAILY BEFORE BREAKFAST, Disp: 30 tablet, Rfl: 0   Accu-Chek Softclix Lancets lancets, USE CHECK GLUCOSE ONCE TO TWICE DAILY, Disp: 100 each, Rfl: 3   amLODipine  (NORVASC ) 10 MG tablet, Take 1 tablet (10 mg total) by mouth at bedtime., Disp: 90 tablet, Rfl: 3   BD PEN NEEDLE NANO 2ND GEN 32G X 4 MM MISC, USE NEEDLE WITH INSULIN  PEN TO INJECT YOUR INSULIN  AS DIRECTED, Disp: 100 each, Rfl: 3   Blood Glucose Monitoring Suppl (ACCU-CHEK GUIDE) w/Device KIT, Use to check blood sugars 1-2 times daily., Disp: 1 kit, Rfl: 0   cetirizine  (ZYRTEC ) 10 MG tablet, Take 1 tablet by mouth daily., Disp: , Rfl:    diclofenac  Sodium (VOLTAREN  ARTHRITIS PAIN) 1 % GEL, Apply 4 g topically 4 (four) times daily., Disp: 4 g, Rfl: 0   Elastic Bandages & Supports (WRIST SPLINT/ELASTIC RIGHT XL) MISC, 1 Application by Does not apply route daily., Disp: 1 each, Rfl: 0   fluticasone  (FLONASE ) 50 MCG/ACT nasal spray, Place 2 sprays into both nostrils daily., Disp: , Rfl:    gabapentin  (NEURONTIN ) 100 MG capsule, Take 1 capsule (100 mg total) by mouth 3 (three) times daily., Disp: 90 capsule, Rfl: 1   glucose blood (ACCU-CHEK GUIDE TEST) test strip, Use to check blood sugar once to twice  daily, Disp: 100 each, Rfl: 3   LANTUS  SOLOSTAR 100 UNIT/ML Solostar Pen, INJECT 25 UNITS SUBCUTANEOUSLY ONCE DAILY, Disp: 30 mL, Rfl: 0   losartan  (COZAAR ) 50 MG tablet, Take 1 tablet (50 mg total) by mouth daily., Disp: 90 tablet, Rfl: 3   omeprazole  (PRILOSEC) 20 MG capsule, Take 1 capsule (20 mg total) by mouth daily before breakfast., Disp: 30 capsule, Rfl: 0   OZEMPIC , 2 MG/DOSE, 8 MG/3ML SOPN, INJECT 2 MG INTO THE SKIN ONCE A WEEK, Disp: 3 mL, Rfl: 0   rosuvastatin  (CRESTOR ) 20 MG tablet, Take 1 tablet by mouth once daily, Disp: 90 tablet, Rfl: 0   traMADol (ULTRAM) 50 MG tablet, Take 50 mg by mouth every 6 (six) hours as needed for moderate pain (pain score 4-6)., Disp: , Rfl:    Medications ordered in this encounter:  No orders of the defined types were placed in this encounter.    *If you need refills on other medications prior to your next appointment, please contact your pharmacy*  Follow-Up: Call back or seek an in-person evaluation if the symptoms worsen or if the condition fails to improve as anticipated.  [facility] [phone]  Other Instructions Take otc Allegra twice daily for about 7-10 days. Use previously prescribed Flonase  nasal spray. Increase fluids Start otc Coricidin decongestant and use as directed on the box. Continue to watch for worsening symptoms. Schedule a virtual appointment or follow up  at an urgent care clinic if symptoms don't improve.    If you have been instructed to have an in-person evaluation today at a local Urgent Care facility, please use the link below. It will take you to a list of all of our available [facility], including address, phone number and hours of operation. Please do not delay care.  [facility]  If you or a family member do not have a primary care provider, use the link below to schedule a visit and establish care. When you choose a [facility] primary care physician or advanced  practice provider, you gain a long-term partner in health. Find a Primary Care Provider  Learn more about [facility]'s in-office and virtual care options: [facility] - Get Care Now

## 2023-12-02 NOTE — Progress Notes (Signed)
 Virtual Visit Consent   John [name], you are scheduled for a virtual visit with a [facility] provider today. Just as with appointments in the office, your consent must be obtained to participate. Your consent will be active for this visit and any virtual visit you may have with one of our providers in the next 365 days. If you have a MyChart account, a copy of this consent can be sent to you electronically.  As this is a virtual visit, video technology does not allow for your provider to perform a traditional examination. This may limit your provider's ability to fully assess your condition. If your provider identifies any concerns that need to be evaluated in person or the need to arrange testing (such as labs, EKG, etc.), we will make arrangements to do so. Although advances in technology are sophisticated, we cannot ensure that it will always work on either your end or our end. If the connection with a video visit is poor, the visit may have to be switched to a telephone visit. With either a video or telephone visit, we are not always able to ensure that we have a secure connection.  By engaging in this virtual visit, you consent to the provision of healthcare and authorize for your insurance to be billed (if applicable) for the services provided during this visit. Depending on your insurance coverage, you may receive a charge related to this service.  I need to obtain your verbal consent now. Are you willing to proceed with your visit today? John [name] has provided verbal consent on 12/02/2023 for a virtual visit (video or telephone). Wanita Gutta, PA-C  Date: 12/02/2023 6:05 PM   Virtual Visit via Video Note   I, John [name], connected with  John [name]  ([id number], [birth year]) on 12/02/23 at  6:00 PM EDT by a video-enabled telemedicine application and verified that I am speaking with the correct person using two identifiers.  Location: Patient: Virtual Visit Location Patient:  Home Provider: Virtual Visit Location Provider: Home Office   I discussed the limitations of evaluation and management by telemedicine and the availability of in person appointments. The patient expressed understanding and agreed to proceed.    History of Present Illness: John [name] is a 58 y.o. who identifies as a male who was assigned male at birth, and is being seen today for URI symptoms.  HPI: 58 y/o M presents via video telehealth visit for c/o itchy eyes, rhinorrhea, post-nasal drainage, and feeling dizzy in the AM. He also c/o cough worse at night or upon waking up in AM. Has been taking Allegra.   URI     Problems:  Patient Active Problem List   Diagnosis Date Noted   Wrist pain 08/26/2023   Shoulder pain, bilateral 01/11/2023   BPPV (benign paroxysmal positional vertigo) 01/11/2023   Viral upper respiratory infection 09/29/2022   Tachycardia 04/01/2022   Healthcare maintenance 12/03/2021   Erectile dysfunction 02/28/2020   Hyperlipidemia associated with type 2 diabetes mellitus (HCC) 10/18/2019   Type 2 diabetes mellitus with unspecified complications (HCC) 07/26/2019   Schizophrenia (HCC) 07/25/2019   Anxiety disorder 07/25/2019   OSA (obstructive sleep apnea) 08/17/2013   Morbid obesity (HCC) 08/17/2013   Hypertension 08/17/2013    Allergies: No Known Allergies Medications:  Current Outpatient Medications:    JARDIANCE  10 MG TABS tablet, TAKE 1 TABLET BY MOUTH ONCE DAILY BEFORE BREAKFAST, Disp: 30 tablet, Rfl: 0   Accu-Chek Softclix Lancets lancets, USE CHECK GLUCOSE ONCE  TO TWICE DAILY, Disp: 100 each, Rfl: 3   amLODipine  (NORVASC ) 10 MG tablet, Take 1 tablet (10 mg total) by mouth at bedtime., Disp: 90 tablet, Rfl: 3   BD PEN NEEDLE NANO 2ND GEN 32G X 4 MM MISC, USE NEEDLE WITH INSULIN  PEN TO INJECT YOUR INSULIN  AS DIRECTED, Disp: 100 each, Rfl: 3   Blood Glucose Monitoring Suppl (ACCU-CHEK GUIDE) w/Device KIT, Use to check blood sugars 1-2 times daily., Disp: 1  kit, Rfl: 0   cetirizine  (ZYRTEC ) 10 MG tablet, Take 1 tablet by mouth daily., Disp: , Rfl:    diclofenac  Sodium (VOLTAREN  ARTHRITIS PAIN) 1 % GEL, Apply 4 g topically 4 (four) times daily., Disp: 4 g, Rfl: 0   Elastic Bandages & Supports (WRIST SPLINT/ELASTIC RIGHT XL) MISC, 1 Application by Does not apply route daily., Disp: 1 each, Rfl: 0   fluticasone  (FLONASE ) 50 MCG/ACT nasal spray, Place 2 sprays into both nostrils daily., Disp: , Rfl:    gabapentin  (NEURONTIN ) 100 MG capsule, Take 1 capsule (100 mg total) by mouth 3 (three) times daily., Disp: 90 capsule, Rfl: 1   glucose blood (ACCU-CHEK GUIDE TEST) test strip, Use to check blood sugar once to twice daily, Disp: 100 each, Rfl: 3   LANTUS  SOLOSTAR 100 UNIT/ML Solostar Pen, INJECT 25 UNITS SUBCUTANEOUSLY ONCE DAILY, Disp: 30 mL, Rfl: 0   losartan  (COZAAR ) 50 MG tablet, Take 1 tablet (50 mg total) by mouth daily., Disp: 90 tablet, Rfl: 3   omeprazole  (PRILOSEC) 20 MG capsule, Take 1 capsule (20 mg total) by mouth daily before breakfast., Disp: 30 capsule, Rfl: 0   OZEMPIC , 2 MG/DOSE, 8 MG/3ML SOPN, INJECT 2 MG INTO THE SKIN ONCE A WEEK, Disp: 3 mL, Rfl: 0   rosuvastatin  (CRESTOR ) 20 MG tablet, Take 1 tablet by mouth once daily, Disp: 90 tablet, Rfl: 0   traMADol (ULTRAM) 50 MG tablet, Take 50 mg by mouth every 6 (six) hours as needed for moderate pain (pain score 4-6)., Disp: , Rfl:   Observations/Objective: Patient is well-developed, well-nourished in no acute distress.  Resting comfortably  at home.  Head is normocephalic, atraumatic.  No labored breathing.  Speech is clear and coherent with logical content.  Patient is alert and oriented at baseline.    Assessment and Plan: 1. Environmental allergies (Primary)  Take otc Allegra twice daily for about 7-10 days. Use previously prescribed Flonase  nasal spray. Increase fluids Start otc Coricidin decongestant and use as directed on the box. Continue to watch for worsening  symptoms. Schedule a virtual appointment or follow up at an urgent care clinic if symptoms don't improve.  Pt verbalized understanding and in agreement.    Follow Up Instructions: I discussed the assessment and treatment plan with the patient. The patient was provided an opportunity to ask questions and all were answered. The patient agreed with the plan and demonstrated an understanding of the instructions.  A copy of instructions were sent to the patient via MyChart unless otherwise noted below.   Patient has requested to receive PHI (AVS, Work Notes, etc) pertaining to this video visit through e-mail as they are currently without active MyChart. They have voiced understand that email is not considered secure and their health information could be viewed by someone other than the patient.   The patient was advised to call back or seek an in-person evaluation if the symptoms worsen or if the condition fails to improve as anticipated.    Goddess Gebbia, PA-C

## 2023-12-07 ENCOUNTER — Other Ambulatory Visit: Payer: Self-pay | Admitting: Internal Medicine

## 2023-12-07 NOTE — Telephone Encounter (Signed)
 Medication sent to pharmacy

## 2023-12-11 ENCOUNTER — Ambulatory Visit (INDEPENDENT_AMBULATORY_CARE_PROVIDER_SITE_OTHER)

## 2023-12-11 ENCOUNTER — Ambulatory Visit (HOSPITAL_COMMUNITY): Payer: Self-pay | Admitting: Physician Assistant

## 2023-12-11 ENCOUNTER — Encounter (HOSPITAL_COMMUNITY): Payer: Self-pay

## 2023-12-11 ENCOUNTER — Ambulatory Visit (HOSPITAL_COMMUNITY)
Admission: EM | Admit: 2023-12-11 | Discharge: 2023-12-11 | Disposition: A | Discharge status: Home / Self Care | Attending: Physician Assistant | Admitting: Physician Assistant

## 2023-12-11 DIAGNOSIS — M79602 Pain in left arm: Secondary | ICD-10-CM | POA: Diagnosis not present

## 2023-12-11 DIAGNOSIS — R109 Unspecified abdominal pain: Secondary | ICD-10-CM

## 2023-12-11 DIAGNOSIS — M5412 Radiculopathy, cervical region: Secondary | ICD-10-CM | POA: Diagnosis not present

## 2023-12-11 DIAGNOSIS — M542 Cervicalgia: Secondary | ICD-10-CM | POA: Diagnosis not present

## 2023-12-11 DIAGNOSIS — M4722 Other spondylosis with radiculopathy, cervical region: Secondary | ICD-10-CM | POA: Diagnosis not present

## 2023-12-11 MED ORDER — ALUM & MAG HYDROXIDE-SIMETH 200-200-20 MG/5ML PO SUSP
ORAL | Status: AC
Start: 2023-12-11 — End: ?
  Filled 2023-12-11: qty 30

## 2023-12-11 MED ORDER — BACLOFEN 10 MG PO TABS
10.0000 mg | ORAL_TABLET | Freq: Two times a day (BID) | ORAL | 0 refills | Status: DC | PRN
Start: 2023-12-11 — End: 2024-03-20

## 2023-12-11 MED ORDER — LIDOCAINE 5 % EX PTCH: 1.0000 | MEDICATED_PATCH | CUTANEOUS | 0 refills | Status: DC

## 2023-12-11 MED ORDER — ALUM & MAG HYDROXIDE-SIMETH 200-200-20 MG/5ML PO SUSP
30.0000 mL | Freq: Once | ORAL | Status: AC
  Administered 2023-12-11: 30 mL via ORAL

## 2023-12-11 MED ORDER — MELOXICAM 15 MG PO TABS: 15.0000 mg | ORAL_TABLET | Freq: Every day | ORAL | 0 refills | Status: DC

## 2023-12-11 NOTE — ED Provider Notes (Signed)
 [facility]    CSN: [id number] Arrival date & time: 12/11/23  1726      History   Chief Complaint Chief Complaint  Patient presents with   Neck Pain   Gas    HPI John [name] is a 58 y.o. male.   Patient presents today with several concerns.  He reports several month history of left-sided arm and neck pain.  He does have a history of degenerative disc disease in the cervical region that has led to right-sided radiculopathy but he underwent steroid injection that provided resolution of the symptoms.  Since then, he has developed pain on the left.  He describes this as a numbness/tingling and shooting sensation throughout his neck and arm.  He is right-handed.  He has been taking over-the-counter medication such as Tylenol  and ibuprofen  without improvement.  He is prescribed gabapentin  but this has been ineffective.  He denies any recent injury or increase in activity.  Denies any recent spinal procedure.  Denies any fever, headache, chest pain, shortness of breath, nausea, vomiting.  In addition, he reports a several hour history of abdominal cramping/feeling that there is gas in his abdomen.  He does report eating cauliflower and broccoli before coming here.  Denies any nausea, vomiting.  He denies any significant pain just reports uncomfortable sensation that he needs to pass gas.  He reports his last bowel movement was earlier today (this morning) and was normal without blood or mucus.  He has not tried any over-the-counter medication for symptom management.    Past Medical History:  Diagnosis Date   Anxiety disorder    Erectile dysfunction    Hyperlipidemia    Hypertension    Obesity, morbid, BMI 50 or higher (HCC)    Poorly controlled type 2 diabetes mellitus (HCC)    Schizophrenia (HCC)    Sleep apnea     Patient Active Problem List   Diagnosis Date Noted   Wrist pain 08/26/2023   Shoulder pain, bilateral 01/11/2023   BPPV (benign paroxysmal positional  vertigo) 01/11/2023   Viral upper respiratory infection 09/29/2022   Tachycardia 04/01/2022   Healthcare maintenance 12/03/2021   Erectile dysfunction 02/28/2020   Hyperlipidemia associated with type 2 diabetes mellitus (HCC) 10/18/2019   Type 2 diabetes mellitus with unspecified complications (HCC) 07/26/2019   Schizophrenia (HCC) 07/25/2019   Anxiety disorder 07/25/2019   OSA (obstructive sleep apnea) 08/17/2013   Morbid obesity (HCC) 08/17/2013   Hypertension 08/17/2013    Past Surgical History:  Procedure Laterality Date   ANKLE SURGERY     APPENDECTOMY         Home Medications    Prior to Admission medications   Medication Sig Start Date End Date Taking? Authorizing Provider  baclofen (LIORESAL) 10 MG tablet Take 1 tablet (10 mg total) by mouth 2 (two) times daily as needed for muscle spasms. 12/11/23  Yes Pharaoh Pio K, PA-C  lidocaine  (LIDODERM ) 5 % Place 1 patch onto the skin daily. Remove & Discard patch within 12 hours or as directed by MD 12/11/23  Yes Aldona Bryner, Betsey Brow, PA-C  meloxicam  (MOBIC ) 15 MG tablet Take 1 tablet (15 mg total) by mouth daily. 12/11/23  Yes Izell Labat K, PA-C  Accu-Chek Softclix Lancets lancets USE CHECK GLUCOSE ONCE TO TWICE DAILY 08/26/23   Alexander-Savino, [place], MD  amLODipine  (NORVASC ) 10 MG tablet Take 1 tablet (10 mg total) by mouth at bedtime. 08/26/23   Jose Ngo, MD  BD PEN NEEDLE NANO 2ND GEN 32G  X 4 MM MISC USE NEEDLE WITH INSULIN  PEN TO INJECT YOUR INSULIN  AS DIRECTED 08/26/23   Alexander-Savino, [place], MD  Blood Glucose Monitoring Suppl (ACCU-CHEK GUIDE) w/Device KIT Use to check blood sugars 1-2 times daily. 03/26/23   [name], MD  cetirizine  (ZYRTEC ) 10 MG tablet Take 1 tablet by mouth daily.    [provider]  diclofenac  Sodium (VOLTAREN  ARTHRITIS PAIN) 1 % GEL Apply 4 g topically 4 (four) times daily. 08/26/23   Alexander-Savino, [place], MD  Elastic Bandages & Supports (WRIST SPLINT/ELASTIC RIGHT  XL) MISC 1 Application by Does not apply route daily. 08/26/23   Alexander-Savino, [place], MD  fluticasone  (FLONASE ) 50 MCG/ACT nasal spray Place 2 sprays into both nostrils daily. 10/01/23   [provider]  gabapentin  (NEURONTIN ) 100 MG capsule Take 1 capsule (100 mg total) by mouth 3 (three) times daily. 09/01/23 11/30/23  Cathey Clunes, MD  glucose blood (ACCU-CHEK GUIDE TEST) test strip Use to check blood sugar once to twice daily 08/26/23   Isabell Manzanilla, [place], MD  JARDIANCE  10 MG TABS tablet TAKE 1 TABLET BY MOUTH ONCE DAILY BEFORE BREAKFAST 12/07/23   [name], MD  LANTUS  SOLOSTAR 100 UNIT/ML Solostar Pen INJECT 25 UNITS SUBCUTANEOUSLY ONCE DAILY 08/27/23   [name], MD  losartan  (COZAAR ) 50 MG tablet Take 1 tablet (50 mg total) by mouth daily. 03/26/23 03/25/24  Driscilla George, MD  omeprazole  (PRILOSEC) 20 MG capsule Take 1 capsule (20 mg total) by mouth daily before breakfast. 10/30/23   Collins Dean, NP  OZEMPIC , 2 MG/DOSE, 8 MG/3ML SOPN INJECT 2 MG INTO THE SKIN ONCE A WEEK 11/29/23   [name], MD  rosuvastatin  (CRESTOR ) 20 MG tablet Take 1 tablet by mouth once daily 11/24/23   Khan, Ghalib, MD  traMADol (ULTRAM) 50 MG tablet Take 50 mg by mouth every 6 (six) hours as needed for moderate pain (pain score 4-6).    [provider]    Family History Family History  Problem Relation Age of Onset   Breast cancer Mother    Alcohol abuse Father    Hypertension Sister    Breast cancer Sister    Hypertension Brother    Diabetes type II Brother    Colon cancer Paternal Uncle        possible but not sure   Colon polyps Neg Hx    Rectal cancer Neg Hx    Stomach cancer Neg Hx     Social History Social History   Tobacco Use   Smoking status: Never   Smokeless tobacco: Never  Vaping Use   Vaping status: Never Used  Substance Use Topics   Alcohol use: Yes    Comment: rare in the last two years, 1x in 2-3 months   Drug use: Yes    Types:  Marijuana    Comment: Occasional     Allergies   Patient has no known allergies.   Review of Systems Review of Systems  Constitutional:  Positive for activity change. Negative for appetite change, fatigue and fever.  Eyes:  Negative for photophobia and visual disturbance.  Respiratory:  Negative for shortness of breath.   Cardiovascular:  Negative for chest pain.  Gastrointestinal:  Positive for abdominal pain. Negative for diarrhea, nausea and vomiting.  Musculoskeletal:  Positive for arthralgias and neck pain. Negative for myalgias.  Neurological:  Negative for dizziness, weakness, light-headedness, numbness and headaches.     Physical Exam Triage Vital Signs ED Triage Vitals  Encounter Vitals Group  BP 12/11/23 1742 (!) (P) 153/94     Systolic BP Percentile --      Diastolic BP Percentile --      Pulse Rate 12/11/23 1741 97     Resp 12/11/23 1741 18     Temp 12/11/23 1741 98.5 F (36.9 C)     Temp Source 12/11/23 1741 Oral     SpO2 12/11/23 1741 97 %     Weight --      Height --      Head Circumference --      Peak Flow --      Pain Score 12/11/23 1739 8     Pain Loc --      Pain Education --      Exclude from Growth Chart --    No data found.  Updated Vital Signs BP (!) (P) 153/94 (BP Location: Left Arm)   Pulse 97   Temp 98.5 F (36.9 C) (Oral)   Resp 18   SpO2 97%   Visual Acuity Right Eye Distance:   Left Eye Distance:   Bilateral Distance:    Right Eye Near:   Left Eye Near:    Bilateral Near:     Physical Exam Vitals reviewed.  Constitutional:      General: He is awake.     Appearance: Normal appearance. He is well-developed. He is not ill-appearing.     Comments: Very pleasant male appear stated age in no acute distress sitting comfortably in exam room  HENT:     Head: Normocephalic and atraumatic.  Cardiovascular:     Rate and Rhythm: Normal rate and regular rhythm.     Heart sounds: Normal heart sounds, S1 normal and S2 normal. No  murmur heard. Pulmonary:     Effort: Pulmonary effort is normal.     Breath sounds: Normal breath sounds. No stridor. No wheezing, rhonchi or rales.     Comments: Clear to auscultation bilaterally Abdominal:     General: Bowel sounds are normal.     Palpations: Abdomen is soft.     Tenderness: There is no abdominal tenderness. There is no right CVA tenderness, left CVA tenderness, guarding or rebound.     Comments: Benign abdominal exam  Musculoskeletal:     Cervical back: Normal range of motion and neck supple. Spasms, tenderness and bony tenderness present. Muscular tenderness present. No spinous process tenderness.     Thoracic back: No tenderness or bony tenderness.     Lumbar back: No tenderness or bony tenderness.     Comments: Back: Pain percussion of lower cervical vertebrae at approximately C6-C7.  No deformity noted.  Tender to palpation of bilateral cervical paraspinal muscles but worse on the left.  Spasm noted along left trapezius.  Strength 5/5 bilateral upper and lower extremities.  Left hand neurovascularly intact; normal 2-point discrimination.  Neurological:     Mental Status: He is alert.  Psychiatric:        Behavior: Behavior is cooperative.      UC Treatments / Results  Labs (all labs ordered are listed, but only abnormal results are displayed) Labs Reviewed - No data to display  EKG   Radiology DG Cervical Spine Complete Result Date: 12/11/2023 CLINICAL DATA:  Cervical radiculopathy. Pain in the left side of the neck radiating down the left upper extremity for several weeks. Cold feeling in the left hand. No known injury. EXAM: CERVICAL SPINE - COMPLETE 4+ VIEW COMPARISON:  None Available. FINDINGS: There is reversal of the  usual cervical lordosis without anterior subluxation. This could be positional or may indicate degenerative change or muscle spasm. Normal alignment of the facet joints. No vertebral compression deformities. Degenerative changes throughout  with narrowed interspaces and endplate osteophyte formation. Degenerative changes in the facet joints. Degenerative osteophyte formation causes bone encroachment upon multiple neural foramina bilaterally. C1-2 articulation appears intact. No prevertebral soft tissue swelling. IMPRESSION: Nonspecific reversal of the usual cervical lordosis. Moderate degenerative changes. No acute displaced fractures identified. Electronically Signed   By: Boyce Byes M.D.   On: 12/11/2023 18:44    Procedures Procedures (including critical care time)  Medications Ordered in UC Medications  alum & mag hydroxide-simeth (MAALOX/MYLANTA) 200-200-20 MG/5ML suspension 30 mL (30 mLs Oral Given 12/11/23 1811)    Initial Impression / Assessment and Plan / UC Course  I have reviewed the triage vital signs and the nursing notes.  Pertinent labs & imaging results that were available during my care of the patient were reviewed by me and considered in my medical decision making (see chart for details).     Patient is well-appearing, afebrile, nontoxic, nontachycardic.  I suspect symptoms are related to cervical radiculopathy given he has had similar episodes in the past that resolved with intra facet injection of steroids.  X-ray was obtained given his worsening symptoms that showed degenerative changes without acute abnormality based on my primary read.  At the time of discharge we were waiting for radiologist overread and we will contact him if this differs and changes his treatment plan.  Will start Mobic  daily and we discussed that he is not to take NSAIDs with this medication due to risk of GI bleeding.  He can use the baclofen up to twice a day for pain relief and we discussed that this can be sedating so he is not to drive or drink alcohol with taking it.  He can apply lidocaine  patch for additional symptom relief.  Recommend heat and gentle stretch.  We discussed that he should follow-up with orthopedics for further  evaluation and management.  If he has any worsening or changing symptoms he needs to be seen immediately.  He was given Maalox during visit with resolution of symptoms.  We discussed symptoms are likely related to broccoli/cauliflower which can increase intestinal gas production.  He was encouraged to eat a bland diet and drink plenty of fluids.  We discussed that if he has recurrent pain, worsening pain, fever, nausea, vomiting, difficulty passing gas or stool he needs to be seen immediately.  Strict return precautions given.  All questions were answered to patient satisfaction.  Final Clinical Impressions(s) / UC Diagnoses   Final diagnoses:  Cervical radiculopathy  Pain in left arm  Abdominal cramping     Discharge Instructions      I do not see anything on your x-ray other than arthritis which we knew about.  I will contact you if the radiologist sees something that changes our treatment plan.  Start baclofen twice daily.  This will make you sleepy so do not drive or drink alcohol with taking it.  Apply lidocaine  patch during the day and then remove this at night; use only 1 patch per 24 hours.  Start Mobic  daily.  Do not take additional NSAIDs with this medication including aspirin, ibuprofen /Advil , naproxen /Aleve .  Follow-up with orthopedics for further evaluation and management.  If anything worsens please return for reevaluation and any increasing pain, numbness or tingling in your arm, weakness in your arm, difficulty moving your neck,  fever, headache, vision change.  I believe your abdominal cramping is related to the food that you are eating.  Eat a bland diet and drink plenty of fluids.  If you develop any severe abdominal pain, nausea, vomiting, difficulty passing gas or stool you need to be seen immediately.   ED Prescriptions     Medication Sig Dispense Auth. Provider   baclofen (LIORESAL) 10 MG tablet Take 1 tablet (10 mg total) by mouth 2 (two) times daily as needed for  muscle spasms. 20 each Isayah Ignasiak K, PA-C   lidocaine  (LIDODERM ) 5 % Place 1 patch onto the skin daily. Remove & Discard patch within 12 hours or as directed by MD 30 patch Darrin Apodaca K, PA-C   meloxicam  (MOBIC ) 15 MG tablet Take 1 tablet (15 mg total) by mouth daily. 14 tablet Akeira Lahm K, PA-C      PDMP not reviewed this encounter.   Budd Cargo, PA-C 12/11/23 4098

## 2023-12-11 NOTE — Discharge Instructions (Addendum)
 I do not see anything on your x-ray other than arthritis which we knew about.  I will contact you if the radiologist sees something that changes our treatment plan.  Start baclofen twice daily.  This will make you sleepy so do not drive or drink alcohol with taking it.  Apply lidocaine  patch during the day and then remove this at night; use only 1 patch per 24 hours.  Start Mobic  daily.  Do not take additional NSAIDs with this medication including aspirin, ibuprofen /Advil , naproxen /Aleve .  Follow-up with orthopedics for further evaluation and management.  If anything worsens please return for reevaluation and any increasing pain, numbness or tingling in your arm, weakness in your arm, difficulty moving your neck, fever, headache, vision change.  I believe your abdominal cramping is related to the food that you are eating.  Eat a bland diet and drink plenty of fluids.  If you develop any severe abdominal pain, nausea, vomiting, difficulty passing gas or stool you need to be seen immediately.

## 2023-12-11 NOTE — ED Triage Notes (Signed)
 Pt c/o pain that starts in neck (left side) and radiates down LUE. Has been ongoing for several weeks. Described pain as burning; states L hand feels cold. Denies any chest or back pain. No known injury. Pain is intermittent.  Pt also c/o gas and asked for treatment. Of note, pt mentions he has had moments of dizziness since the increase in his Losartan  a couple months ago.

## 2023-12-12 ENCOUNTER — Telehealth

## 2023-12-12 DIAGNOSIS — H66001 Acute suppurative otitis media without spontaneous rupture of ear drum, right ear: Secondary | ICD-10-CM | POA: Diagnosis not present

## 2023-12-12 DIAGNOSIS — G4733 Obstructive sleep apnea (adult) (pediatric): Secondary | ICD-10-CM | POA: Diagnosis not present

## 2023-12-12 MED ORDER — FLUTICASONE PROPIONATE 50 MCG/ACT NA SUSP: 2.0000 | Freq: Every day | NASAL | 6 refills | Status: AC

## 2023-12-12 MED ORDER — AMOXICILLIN-POT CLAVULANATE 875-125 MG PO TABS: 1.0000 | ORAL_TABLET | Freq: Two times a day (BID) | ORAL | 0 refills | Status: DC

## 2023-12-12 NOTE — Progress Notes (Signed)
 Virtual Visit Consent   John [name], you are scheduled for a virtual visit with a [facility] provider today. Just as with appointments in the office, your consent must be obtained to participate. Your consent will be active for this visit and any virtual visit you may have with one of our providers in the next 365 days. If you have a MyChart account, a copy of this consent can be sent to you electronically.  As this is a virtual visit, video technology does not allow for your provider to perform a traditional examination. This may limit your provider's ability to fully assess your condition. If your provider identifies any concerns that need to be evaluated in person or the need to arrange testing (such as labs, EKG, etc.), we will make arrangements to do so. Although advances in technology are sophisticated, we cannot ensure that it will always work on either your end or our end. If the connection with a video visit is poor, the visit may have to be switched to a telephone visit. With either a video or telephone visit, we are not always able to ensure that we have a secure connection.  By engaging in this virtual visit, you consent to the provision of healthcare and authorize for your insurance to be billed (if applicable) for the services provided during this visit. Depending on your insurance coverage, you may receive a charge related to this service.  I need to obtain your verbal consent now. Are you willing to proceed with your visit today? John [name] has provided verbal consent on 12/12/2023 for a virtual visit (video or telephone). Tommas Fragmin, FNP  Date: 12/12/2023 9:27 AM   Virtual Visit via Video Note   I, Tommas Fragmin, connected with  John [name]  ([id number], [birth year]) on 12/12/23 at  9:15 AM EDT by a video-enabled telemedicine application and verified that I am speaking with the correct person using two identifiers.  Location: Patient: Virtual Visit Location Patient:  Home Provider: Virtual Visit Location Provider: Home Office   I discussed the limitations of evaluation and management by telemedicine and the availability of in person appointments. The patient expressed understanding and agreed to proceed.    History of Present Illness: John [name] is a 58 y.o. who identifies as a male who was assigned male at birth, and is being seen today for right ear pain.  HPI: Otalgia  There is pain in the right ear. This is a new problem. The current episode started yesterday. The pain is at a severity of 8/10. The pain is moderate. Associated symptoms include coughing, ear discharge, headaches and rhinorrhea. Pertinent negatives include no abdominal pain or sore throat. He has tried acetaminophen  (Allegra) for the symptoms. The treatment provided mild relief.    Problems:  Patient Active Problem List   Diagnosis Date Noted   Wrist pain 08/26/2023   Shoulder pain, bilateral 01/11/2023   BPPV (benign paroxysmal positional vertigo) 01/11/2023   Viral upper respiratory infection 09/29/2022   Tachycardia 04/01/2022   Healthcare maintenance 12/03/2021   Erectile dysfunction 02/28/2020   Hyperlipidemia associated with type 2 diabetes mellitus (HCC) 10/18/2019   Type 2 diabetes mellitus with unspecified complications (HCC) 07/26/2019   Schizophrenia (HCC) 07/25/2019   Anxiety disorder 07/25/2019   OSA (obstructive sleep apnea) 08/17/2013   Morbid obesity (HCC) 08/17/2013   Hypertension 08/17/2013    Allergies: No Known Allergies Medications:  Current Outpatient Medications:    amoxicillin -clavulanate (AUGMENTIN ) 875-125 MG tablet, Take 1 tablet  by mouth 2 (two) times daily., Disp: 14 tablet, Rfl: 0   fluticasone  (FLONASE ) 50 MCG/ACT nasal spray, Place 2 sprays into both nostrils daily., Disp: 16 g, Rfl: 6   Accu-Chek Softclix Lancets lancets, USE CHECK GLUCOSE ONCE TO TWICE DAILY, Disp: 100 each, Rfl: 3   amLODipine  (NORVASC ) 10 MG tablet, Take 1 tablet (10 mg  total) by mouth at bedtime., Disp: 90 tablet, Rfl: 3   baclofen (LIORESAL) 10 MG tablet, Take 1 tablet (10 mg total) by mouth 2 (two) times daily as needed for muscle spasms., Disp: 20 each, Rfl: 0   BD PEN NEEDLE NANO 2ND GEN 32G X 4 MM MISC, USE NEEDLE WITH INSULIN  PEN TO INJECT YOUR INSULIN  AS DIRECTED, Disp: 100 each, Rfl: 3   Blood Glucose Monitoring Suppl (ACCU-CHEK GUIDE) w/Device KIT, Use to check blood sugars 1-2 times daily., Disp: 1 kit, Rfl: 0   cetirizine  (ZYRTEC ) 10 MG tablet, Take 1 tablet by mouth daily., Disp: , Rfl:    diclofenac  Sodium (VOLTAREN  ARTHRITIS PAIN) 1 % GEL, Apply 4 g topically 4 (four) times daily., Disp: 4 g, Rfl: 0   Elastic Bandages & Supports (WRIST SPLINT/ELASTIC RIGHT XL) MISC, 1 Application by Does not apply route daily., Disp: 1 each, Rfl: 0   gabapentin  (NEURONTIN ) 100 MG capsule, Take 1 capsule (100 mg total) by mouth 3 (three) times daily., Disp: 90 capsule, Rfl: 1   glucose blood (ACCU-CHEK GUIDE TEST) test strip, Use to check blood sugar once to twice daily, Disp: 100 each, Rfl: 3   JARDIANCE  10 MG TABS tablet, TAKE 1 TABLET BY MOUTH ONCE DAILY BEFORE BREAKFAST, Disp: 30 tablet, Rfl: 0   LANTUS  SOLOSTAR 100 UNIT/ML Solostar Pen, INJECT 25 UNITS SUBCUTANEOUSLY ONCE DAILY, Disp: 30 mL, Rfl: 0   lidocaine  (LIDODERM ) 5 %, Place 1 patch onto the skin daily. Remove & Discard patch within 12 hours or as directed by MD, Disp: 30 patch, Rfl: 0   losartan  (COZAAR ) 50 MG tablet, Take 1 tablet (50 mg total) by mouth daily., Disp: 90 tablet, Rfl: 3   meloxicam  (MOBIC ) 15 MG tablet, Take 1 tablet (15 mg total) by mouth daily., Disp: 14 tablet, Rfl: 0   omeprazole  (PRILOSEC) 20 MG capsule, Take 1 capsule (20 mg total) by mouth daily before breakfast., Disp: 30 capsule, Rfl: 0   OZEMPIC , 2 MG/DOSE, 8 MG/3ML SOPN, INJECT 2 MG INTO THE SKIN ONCE A WEEK, Disp: 3 mL, Rfl: 0   rosuvastatin  (CRESTOR ) 20 MG tablet, Take 1 tablet by mouth once daily, Disp: 90 tablet, Rfl: 0    traMADol (ULTRAM) 50 MG tablet, Take 50 mg by mouth every 6 (six) hours as needed for moderate pain (pain score 4-6)., Disp: , Rfl:   Observations/Objective: Patient is well-developed, well-nourished in no acute distress.  Resting comfortably  at home.  Head is normocephalic, atraumatic.  No labored breathing.  Speech is clear and coherent with logical content.  Patient is alert and oriented at baseline.  Nasal congestion  Assessment and Plan: 1. Non-recurrent acute suppurative otitis media of right ear without spontaneous rupture of tympanic membrane (Primary) - amoxicillin -clavulanate (AUGMENTIN ) 875-125 MG tablet; Take 1 tablet by mouth 2 (two) times daily.  Dispense: 14 tablet; Refill: 0 - fluticasone  (FLONASE ) 50 MCG/ACT nasal spray; Place 2 sprays into both nostrils daily.  Dispense: 16 g; Refill: 6  - Take meds as prescribed - Use a cool mist humidifier  -Use saline nose sprays frequently -Force fluids -For any cough or congestion  Use plain Mucinex- regular strength or max strength is fine -For fever or aces or pains- take tylenol  or ibuprofen . -Follow up if symptoms worsen or do not improve   Follow Up Instructions: I discussed the assessment and treatment plan with the patient. The patient was provided an opportunity to ask questions and all were answered. The patient agreed with the plan and demonstrated an understanding of the instructions.  A copy of instructions were sent to the patient via MyChart unless otherwise noted below.     The patient was advised to call back or seek an in-person evaluation if the symptoms worsen or if the condition fails to improve as anticipated.    Tommas Fragmin, FNP

## 2023-12-19 ENCOUNTER — Other Ambulatory Visit: Payer: Self-pay | Admitting: Student

## 2023-12-20 NOTE — Telephone Encounter (Signed)
 Medication discontinued 08/26/23

## 2023-12-22 ENCOUNTER — Encounter: Admitting: Student

## 2023-12-22 ENCOUNTER — Telehealth: Admitting: Physician Assistant

## 2023-12-22 DIAGNOSIS — J329 Chronic sinusitis, unspecified: Secondary | ICD-10-CM

## 2023-12-22 NOTE — Progress Notes (Signed)
  Because you were recently treated with Augmentin  and have not improved, I feel your condition warrants further evaluation and I recommend that you be seen in a face-to-face visit.   NOTE: There will be NO CHARGE for this E-Visit   If you are having a true medical emergency, please call 911.     For an urgent face to face visit, [facility] has multiple urgent care centers for your convenience.  Click the link below for the full list of locations and hours, walk-in wait times, appointment scheduling options and driving directions:  Urgent Care - [place], [place], [place], [place], [place], [place]  [place]     Your MyChart E-visit questionnaire answers were reviewed by a board certified advanced clinical practitioner to complete your personal care plan based on your specific symptoms.    Thank you for using e-Visits.      I have spent 5 minutes in review of e-visit questionnaire, review and updating patient chart, medical decision making and response to patient.   Angelia Kelp, PA-C

## 2023-12-23 ENCOUNTER — Encounter: Payer: Self-pay | Admitting: Student

## 2023-12-23 ENCOUNTER — Telehealth: Payer: Self-pay | Admitting: *Deleted

## 2023-12-23 ENCOUNTER — Ambulatory Visit: Admitting: Student

## 2023-12-23 VITALS — BP 148/90 | HR 97 | Ht 71.0 in | Wt 340.6 lb

## 2023-12-23 DIAGNOSIS — I10 Essential (primary) hypertension: Secondary | ICD-10-CM

## 2023-12-23 DIAGNOSIS — H8110 Benign paroxysmal vertigo, unspecified ear: Secondary | ICD-10-CM | POA: Diagnosis not present

## 2023-12-23 DIAGNOSIS — Z794 Long term (current) use of insulin: Secondary | ICD-10-CM | POA: Diagnosis not present

## 2023-12-23 DIAGNOSIS — Z7984 Long term (current) use of oral hypoglycemic drugs: Secondary | ICD-10-CM | POA: Diagnosis not present

## 2023-12-23 DIAGNOSIS — G4733 Obstructive sleep apnea (adult) (pediatric): Secondary | ICD-10-CM

## 2023-12-23 DIAGNOSIS — E118 Type 2 diabetes mellitus with unspecified complications: Secondary | ICD-10-CM | POA: Diagnosis not present

## 2023-12-23 DIAGNOSIS — M5412 Radiculopathy, cervical region: Secondary | ICD-10-CM

## 2023-12-23 DIAGNOSIS — R110 Nausea: Secondary | ICD-10-CM | POA: Insufficient documentation

## 2023-12-23 LAB — POCT GLYCOSYLATED HEMOGLOBIN (HGB A1C): Hemoglobin A1C: 6.9 % — AB (ref 4.0–5.6)

## 2023-12-23 LAB — GLUCOSE, CAPILLARY: Glucose-Capillary: 130 mg/dL — ABNORMAL HIGH (ref 70–99)

## 2023-12-23 MED ORDER — ONDANSETRON HCL 4 MG PO TABS: 4.0000 mg | ORAL_TABLET | Freq: Every day | ORAL | 0 refills | Status: AC | PRN

## 2023-12-23 MED ORDER — SEMAGLUTIDE (1 MG/DOSE) 4 MG/3ML ~~LOC~~ SOPN: 1.0000 mg | PEN_INJECTOR | SUBCUTANEOUS | 3 refills | Status: AC

## 2023-12-23 MED ORDER — OZEMPIC (2 MG/DOSE) 8 MG/3ML ~~LOC~~ SOPN: 1.0000 mg | PEN_INJECTOR | SUBCUTANEOUS | 0 refills | Status: DC

## 2023-12-23 MED ORDER — ACCU-CHEK GUIDE TEST VI STRP: ORAL_STRIP | 3 refills | Status: DC

## 2023-12-23 MED ORDER — LISINOPRIL 20 MG PO TABS: 20.0000 mg | ORAL_TABLET | Freq: Every day | ORAL | 11 refills | Status: DC

## 2023-12-23 NOTE — Telephone Encounter (Signed)
 Copied from CRM [id number]. Topic: Clinical - Prescription Issue >> Dec 23, 2023  4:24 PM Retta Caster wrote: Reason for CRM: Semaglutide , 2 MG/DOSE, (OZEMPIC , 2 MG/DOSE,) 8 MG/3ML SOPN [[id number] from the Pharmacy needs call back due to patient is there not at pharmacy. Clarification if dosage should be 2mg  or 1mg ? Needs call back ASAP  Location: [facility] - [place] ([place] - [address] [place] ([place]) [place] [address] Phone: [phone] Fax: [phone] Hours: Not open 24 hours >> Dec 23, 2023  4:27 PM Retta Caster wrote: Patient Is there at pharmacy

## 2023-12-23 NOTE — Assessment & Plan Note (Addendum)
 Hemoglobin A1c has been much improved recently. We will recheck it today. He is currently taking semaglutide  1 mg weekly, Jardiance  10 mg daily, 25 units Lantus  nightly.  He denies having any low blood sugars. He has been making great strides in his dietary modifications and has cut out a ton of carbohydrates and has been exercising more.  He is no longer drinking any sodas.  He says he feels a lot better and would prefer to get off of medications, which is why he has decreased his own Ozempic  dose from 2 mg to 1 mg weekly.  He denies any new diabetes related symptoms.  We will continue the above medication and follow-up on this in 3 months.

## 2023-12-23 NOTE — Patient Instructions (Addendum)
 Thank you, Mr.Dyson D Gelder for allowing us  to provide your care today. Today we discussed your nausea, dizziness, blood pressure, diabetes, and left arm pain.  Your blood pressure was elevated today at 148/90.  Since you are having trouble with the losartan , we will switch you to lisinopril  20 mg daily.  Please continue taking your amlodipine  10 mg daily.  You may use compression stockings for the leg swelling, as you have been doing.  Will follow-up on how the lisinopril  is working for you at your next visit.  For your dizziness, I suspect it may be benign paroxysmal positional vertigo.  I have placed a referral for vestibular therapy, who should contact you to schedule an appointment.  For your left neck and arm pain, your symptoms are consistent with cervical radiculopathy.  I have placed a referral to sports medicine, as they can do injections and provide more comprehensive treatment for this kind of pain.  In the meantime, you can use extra strength Tylenol  and Voltaren  gel, up to 4 times daily.  I have prescribed a short course of Zofran for your nausea, however I expect this to improve as you stop taking the losartan  and your dizziness improves.  I have ordered the following labs for you:  Lab Orders         Glucose, capillary         POC Hbg A1C      I will call you as soon as I have the results.   Referrals ordered today:  Referral Orders         Ambulatory referral to Sports Medicine      I have ordered the following medication/changed the following medications:   Start the following medications: Meds ordered this encounter  Medications   lisinopril  (ZESTRIL ) 20 MG tablet    Sig: Take 1 tablet (20 mg total) by mouth daily.    Dispense:  30 tablet    Refill:  11   DISCONTD: Semaglutide , 2 MG/DOSE, (OZEMPIC , 2 MG/DOSE,) 8 MG/3ML SOPN    Sig: Inject 1 mg into the skin once a week.    Dispense:  3 mL    Refill:  0   ondansetron (ZOFRAN) 4 MG tablet    Sig: Take 1 tablet (4  mg total) by mouth daily as needed for nausea or vomiting.    Dispense:  20 tablet    Refill:  0   Semaglutide , 1 MG/DOSE, 4 MG/3ML SOPN    Sig: Inject 1 mg into the skin once a week.    Dispense:  3 mL    Refill:  3     Follow up: 3 months   We look forward to seeing you next time. Please call our clinic at [phone] if you have any questions or concerns. The best time to call is Monday-Friday from 9am-4pm, but there is someone available 24/7. If after hours or the weekend, call the main hospital number and ask for the Internal Medicine Resident On-Call. If you need medication refills, please notify your pharmacy one week in advance and they will send us  a request.   Thank you for trusting me with your care. Wishing you the best!   Dorthy Gavia, MD [facility]

## 2023-12-23 NOTE — Assessment & Plan Note (Deleted)
Continues to use CPAP. °

## 2023-12-23 NOTE — Progress Notes (Signed)
 CC: HTN, diabetes follow up   HPI:  John [name] is a 58 y.o. male living with a history stated below and presents today for the chief complaint stated above. Please see problem based assessment and plan for additional details.  Past Medical History:  Diagnosis Date   Anxiety disorder    Erectile dysfunction    Hyperlipidemia    Hypertension    Obesity, morbid, BMI 50 or higher (HCC)    Poorly controlled type 2 diabetes mellitus (HCC)    Schizophrenia (HCC)    Sleep apnea    Current Outpatient Medications on File Prior to Visit  Medication Sig Dispense Refill   Accu-Chek Softclix Lancets lancets USE CHECK GLUCOSE ONCE TO TWICE DAILY 100 each 3   amLODipine  (NORVASC ) 10 MG tablet Take 1 tablet (10 mg total) by mouth at bedtime. 90 tablet 3   baclofen  (LIORESAL ) 10 MG tablet Take 1 tablet (10 mg total) by mouth 2 (two) times daily as needed for muscle spasms. 20 each 0   BD PEN NEEDLE NANO 2ND GEN 32G X 4 MM MISC USE NEEDLE WITH INSULIN  PEN TO INJECT YOUR INSULIN  AS DIRECTED 100 each 3   Blood Glucose Monitoring Suppl (ACCU-CHEK GUIDE) w/Device KIT Use to check blood sugars 1-2 times daily. 1 kit 0   cetirizine  (ZYRTEC ) 10 MG tablet Take 1 tablet by mouth daily.     diclofenac  Sodium (VOLTAREN  ARTHRITIS PAIN) 1 % GEL Apply 4 g topically 4 (four) times daily. 4 g 0   Elastic Bandages & Supports (WRIST SPLINT/ELASTIC RIGHT XL) MISC 1 Application by Does not apply route daily. 1 each 0   fluticasone  (FLONASE ) 50 MCG/ACT nasal spray Place 2 sprays into both nostrils daily. 16 g 6   gabapentin  (NEURONTIN ) 100 MG capsule Take 1 capsule (100 mg total) by mouth 3 (three) times daily. 90 capsule 1   glucose blood (ACCU-CHEK GUIDE TEST) test strip Use to check blood sugar once to twice daily 100 each 3   JARDIANCE  10 MG TABS tablet TAKE 1 TABLET BY MOUTH ONCE DAILY BEFORE BREAKFAST 30 tablet 0   LANTUS  SOLOSTAR 100 UNIT/ML Solostar Pen INJECT 25 UNITS SUBCUTANEOUSLY ONCE DAILY 30 mL 0    lidocaine  (LIDODERM ) 5 % Place 1 patch onto the skin daily. Remove & Discard patch within 12 hours or as directed by MD 30 patch 0   meloxicam  (MOBIC ) 15 MG tablet Take 1 tablet (15 mg total) by mouth daily. 14 tablet 0   omeprazole  (PRILOSEC) 20 MG capsule Take 1 capsule (20 mg total) by mouth daily before breakfast. 30 capsule 0   rosuvastatin  (CRESTOR ) 20 MG tablet Take 1 tablet by mouth once daily 90 tablet 0   traMADol (ULTRAM) 50 MG tablet Take 50 mg by mouth every 6 (six) hours as needed for moderate pain (pain score 4-6).     No current facility-administered medications on file prior to visit.    Family History  Problem Relation Age of Onset   Breast cancer Mother    Alcohol abuse Father    Hypertension Sister    Breast cancer Sister    Hypertension Brother    Diabetes type II Brother    Colon cancer Paternal Uncle        possible but not sure   Colon polyps Neg Hx    Rectal cancer Neg Hx    Stomach cancer Neg Hx     Social History   Socioeconomic History   Marital status: Widowed  Spouse name: Not on file   Number of children: Not on file   Years of education: Not on file   Highest education level: Not on file  Occupational History   Not on file  Tobacco Use   Smoking status: Never   Smokeless tobacco: Never  Vaping Use   Vaping status: Never Used  Substance and Sexual Activity   Alcohol use: Yes    Comment: rare in the last two years, 1x in 2-3 months   Drug use: Yes    Types: Marijuana    Comment: Occasional   Sexual activity: Yes  Other Topics Concern   Not on file  Social History Narrative   Not on file   Social Drivers of Health   Financial Resource Strain: Medium Risk (10/20/2023)   Overall Financial Resource Strain (CARDIA)    Difficulty of Paying Living Expenses: Somewhat hard  Food Insecurity: No Food Insecurity (10/20/2023)   Hunger Vital Sign    Worried About Running Out of Food in the Last Year: Never true    Ran Out of Food in the Last  Year: Never true  Transportation Needs: Unmet Transportation Needs (10/20/2023)   PRAPARE - Transportation    Lack of Transportation (Medical): Yes    Lack of Transportation (Non-Medical): Yes  Physical Activity: Sufficiently Active (10/20/2023)   Exercise Vital Sign    Days of Exercise per Week: 7 days    Minutes of Exercise per Session: 30 min  Stress: Stress Concern Present (10/20/2023)   [employer] of Occupational Health - Occupational Stress Questionnaire    Feeling of Stress : Rather much  Social Connections: Moderately Isolated (10/20/2023)   Social Connection and Isolation Panel    Frequency of Communication with Friends and Family: More than three times a week    Frequency of Social Gatherings with Friends and Family: More than three times a week    Attends Religious Services: More than 4 times per year    Active Member of [employer] or Organizations: No    Attends Banker Meetings: Never    Marital Status: Widowed  Intimate Partner Violence: Not At Risk (10/20/2023)   Humiliation, Afraid, Rape, and Kick questionnaire    Fear of Current or Ex-Partner: No    Emotionally Abused: No    Physically Abused: No    Sexually Abused: No   Review of Systems: ROS negative except for what is noted on the assessment and plan.  Vitals:   12/23/23 1547  BP: (!) 148/90  Pulse: 97  Weight: (!) 340 lb 9.6 oz (154.5 kg)  Height: 5' 11 (1.803 m)   Physical Exam: Well-appearing, obese man sitting in chair no acute distress Heart rate and rhythm regular, no murmurs appreciated, euvolemic on exam Lungs clear to auscultation bilaterally, good air movement Tenderness to palpation on the left base of the neck, extending down around the shoulder, to the elbow, and the hand; dullness to touch and the same distribution, worst in the hand; no focal weakness appreciated; range of motion intact No skin changes appreciated  Assessment & Plan:   Patient discussed with Dr.  Broadus Canes  Hypertension BP Readings from Last 3 Encounters:  12/23/23 (!) 148/90  12/11/23 (!) (P) 153/94  08/26/23 (!) 145/86    Blood pressure is elevated today.  He is taking amlodipine  10 mg daily and losartan  50 mg daily, but he says losartan  makes him feel lightheaded, dizzy and nauseous. He doesn't like the taste.  He took losartan  today  and it made him nauseous, and he is requesting nausea medication.  I will give him a short course of Zofran to take as needed for nausea, which could be related to this medication or could be related to his dizziness with head turning.  Given his intolerance, we will switch his medications today. He says he tolerated lisinopril  in the past, so I will send in a refill of that and discontinue the losartan  for now. He does report some occasional leg swelling, which may be attributable to the amlodipine , but he says it does not bother him too much and he is using compression stockings which help. We will continue amlodipine  at his current dose.   Type 2 diabetes mellitus with unspecified complications (HCC) Hemoglobin A1c has been much improved recently. We will recheck it today. He is currently taking semaglutide  1 mg weekly, Jardiance  10 mg daily, 25 units Lantus  nightly.  He denies having any low blood sugars. He has been making great strides in his dietary modifications and has cut out a ton of carbohydrates and has been exercising more.  He is no longer drinking any sodas.  He says he feels a lot better and would prefer to get off of medications, which is why he has decreased his own Ozempic  dose from 2 mg to 1 mg weekly.  He denies any new diabetes related symptoms.  We will continue the above medication and follow-up on this in 3 months.  BPPV (benign paroxysmal positional vertigo) He continues to have dizziness, which is episodic.  He ascribes this dizziness to his losartan .  He also notes that occurs when he turns his head to the left occasionally. It is  hard to parse apart whether this is BPPV versus a medication side effect versus orthostasis given on the patient's description.  For now, we will work on finding a blood pressure regimen that he is able to tolerate.  At future visits, we can reassess for BPPV and consider vestibular therapy if indicated.  Left cervical radiculopathy He previously had cervical radiculopathy, which he says was worse on the right side.  He said he had injections on the right side of his neck, which helped him in the past.  He presents today with worsening left-sided radiating arm pain, originating at the base of his cervical spine traveling to the posterior aspect of his shoulder and down his arm to his hand.  This is accompanied by the sensation of numbness and tingling in the same distribution.  It is evenly distributed along his hand, not predominantly in certain regions.  He does not have any weakness in the left upper extremity.  He notices it worsens when he presses on the left side of his neck, and with left sided head turning in that direction.  Given his symptoms and my exam, I feel this is most likely cervical radiculopathy, and he would benefit from evaluation by a sports medicine specialist.  In the meantime, I have recommended Voltaren  gel up to 4 times daily and Tylenol  as needed.  I have placed a referral to sports medicine.  Morbid obesity (HCC) He has had success with weight loss.  He is down 11 pounds since his last visit 3 months ago.  He continues to make strides in cleaning up his diet and has cut out many sweets, sodas, carbohydrates, snacks, and other fried foods.  He is currently on Ozempic  1 mg weekly, but would prefer to predominantly focus on lifestyle changes to lose weight and improve  his overall health.  Dorthy Gavia, MD [facility] Internal Medicine, PGY-1 Phone: [phone] Date 12/23/2023 Time 4:52 PM

## 2023-12-23 NOTE — Assessment & Plan Note (Addendum)
 BP Readings from Last 3 Encounters:  12/23/23 (!) 148/90  12/11/23 (!) (P) 153/94  08/26/23 (!) 145/86    Blood pressure is elevated today.  He is taking amlodipine  10 mg daily and losartan  50 mg daily, but he says losartan  makes him feel lightheaded, dizzy and nauseous. He doesn't like the taste.  He took losartan  today and it made him nauseous, and he is requesting nausea medication.  I will give him a short course of Zofran to take as needed for nausea, which could be related to this medication or could be related to his dizziness with head turning.  Given his intolerance, we will switch his medications today. He says he tolerated lisinopril  in the past, so I will send in a refill of that and discontinue the losartan  for now. He does report some occasional leg swelling, which may be attributable to the amlodipine , but he says it does not bother him too much and he is using compression stockings which help. We will continue amlodipine  at his current dose.

## 2023-12-23 NOTE — Assessment & Plan Note (Addendum)
 He previously had cervical radiculopathy, which he says was worse on the right side.  He said he had injections on the right side of his neck, which helped him in the past.  He presents today with worsening left-sided radiating arm pain, originating at the base of his cervical spine traveling to the posterior aspect of his shoulder and down his arm to his hand.  This is accompanied by the sensation of numbness and tingling in the same distribution.  It is evenly distributed along his hand, not predominantly in certain regions.  He does not have any weakness in the left upper extremity.  He notices it worsens when he presses on the left side of his neck, and with left sided head turning in that direction.  Given his symptoms and my exam, I feel this is most likely cervical radiculopathy, and he would benefit from evaluation by a sports medicine specialist.  In the meantime, I have recommended Voltaren  gel up to 4 times daily and Tylenol  as needed.  I have placed a referral to sports medicine.

## 2023-12-23 NOTE — Assessment & Plan Note (Addendum)
 He continues to have dizziness, which is episodic.  He ascribes this dizziness to his losartan .  He also notes that occurs when he turns his head to the left occasionally. It is hard to parse apart whether this is BPPV versus a medication side effect versus orthostasis given on the patient's description.  For now, we will work on finding a blood pressure regimen that he is able to tolerate.  At future visits, we can reassess for BPPV and consider vestibular therapy if indicated.

## 2023-12-23 NOTE — Assessment & Plan Note (Signed)
 He has had success with weight loss.  He is down 11 pounds since his last visit 3 months ago.  He continues to make strides in cleaning up his diet and has cut out many sweets, sodas, carbohydrates, snacks, and other fried foods.  He is currently on Ozempic  1 mg weekly, but would prefer to predominantly focus on lifestyle changes to lose weight and improve his overall health.

## 2023-12-23 NOTE — Addendum Note (Signed)
 Addended by: Dorthy Gavia on: 12/23/2023 04:59 PM   Modules accepted: Orders

## 2023-12-24 ENCOUNTER — Telehealth: Admitting: Physician Assistant

## 2023-12-24 DIAGNOSIS — M542 Cervicalgia: Secondary | ICD-10-CM

## 2023-12-24 DIAGNOSIS — H8112 Benign paroxysmal vertigo, left ear: Secondary | ICD-10-CM | POA: Diagnosis not present

## 2023-12-24 MED ORDER — MECLIZINE HCL 25 MG PO TABS: 25.0000 mg | ORAL_TABLET | Freq: Three times a day (TID) | ORAL | 0 refills | Status: DC | PRN

## 2023-12-24 MED ORDER — NAPROXEN 500 MG PO TABS: 500.0000 mg | ORAL_TABLET | Freq: Two times a day (BID) | ORAL | 0 refills | Status: DC

## 2023-12-24 NOTE — Patient Instructions (Signed)
 John [name], thank you for joining Angelia Kelp, PA-C for today's virtual visit.  While this provider is not your primary care provider (PCP), if your PCP is located in our provider database this encounter information will be shared with them immediately following your visit.   A [facility] MyChart account gives you access to today's visit and all your visits, tests, and labs performed at [facility]  click here if you don't have a [facility] MyChart account or go to [email or web address]  Consent: (Patient) John [name] provided verbal consent for this virtual visit at the beginning of the encounter.  Current Medications:  Current Outpatient Medications:    meclizine (ANTIVERT) 25 MG tablet, Take 1 tablet (25 mg total) by mouth 3 (three) times daily as needed for dizziness., Disp: 30 tablet, Rfl: 0   naproxen  (NAPROSYN ) 500 MG tablet, Take 1 tablet (500 mg total) by mouth 2 (two) times daily with a meal., Disp: 30 tablet, Rfl: 0   Accu-Chek Softclix Lancets lancets, USE CHECK GLUCOSE ONCE TO TWICE DAILY, Disp: 100 each, Rfl: 3   amLODipine  (NORVASC ) 10 MG tablet, Take 1 tablet (10 mg total) by mouth at bedtime., Disp: 90 tablet, Rfl: 3   baclofen  (LIORESAL ) 10 MG tablet, Take 1 tablet (10 mg total) by mouth 2 (two) times daily as needed for muscle spasms., Disp: 20 each, Rfl: 0   BD PEN NEEDLE NANO 2ND GEN 32G X 4 MM MISC, USE NEEDLE WITH INSULIN  PEN TO INJECT YOUR INSULIN  AS DIRECTED, Disp: 100 each, Rfl: 3   Blood Glucose Monitoring Suppl (ACCU-CHEK GUIDE) w/Device KIT, Use to check blood sugars 1-2 times daily., Disp: 1 kit, Rfl: 0   cetirizine  (ZYRTEC ) 10 MG tablet, Take 1 tablet by mouth daily., Disp: , Rfl:    diclofenac  Sodium (VOLTAREN  ARTHRITIS PAIN) 1 % GEL, Apply 4 g topically 4 (four) times daily., Disp: 4 g, Rfl: 0   Elastic Bandages & Supports (WRIST SPLINT/ELASTIC RIGHT XL) MISC, 1 Application by Does not apply route daily., Disp: 1 each, Rfl: 0    fluticasone  (FLONASE ) 50 MCG/ACT nasal spray, Place 2 sprays into both nostrils daily., Disp: 16 g, Rfl: 6   gabapentin  (NEURONTIN ) 100 MG capsule, Take 1 capsule (100 mg total) by mouth 3 (three) times daily., Disp: 90 capsule, Rfl: 1   glucose blood (ACCU-CHEK GUIDE TEST) test strip, Use to check blood sugar once to twice daily, Disp: 100 each, Rfl: 3   JARDIANCE  10 MG TABS tablet, TAKE 1 TABLET BY MOUTH ONCE DAILY BEFORE BREAKFAST, Disp: 30 tablet, Rfl: 0   LANTUS  SOLOSTAR 100 UNIT/ML Solostar Pen, INJECT 25 UNITS SUBCUTANEOUSLY ONCE DAILY, Disp: 30 mL, Rfl: 0   lidocaine  (LIDODERM ) 5 %, Place 1 patch onto the skin daily. Remove & Discard patch within 12 hours or as directed by MD, Disp: 30 patch, Rfl: 0   lisinopril  (ZESTRIL ) 20 MG tablet, Take 1 tablet (20 mg total) by mouth daily., Disp: 30 tablet, Rfl: 11   omeprazole  (PRILOSEC) 20 MG capsule, Take 1 capsule (20 mg total) by mouth daily before breakfast., Disp: 30 capsule, Rfl: 0   ondansetron  (ZOFRAN ) 4 MG tablet, Take 1 tablet (4 mg total) by mouth daily as needed for nausea or vomiting., Disp: 20 tablet, Rfl: 0   rosuvastatin  (CRESTOR ) 20 MG tablet, Take 1 tablet by mouth once daily, Disp: 90 tablet, Rfl: 0   Semaglutide , 1 MG/DOSE, 4 MG/3ML SOPN, Inject 1 mg into the skin once a week., Disp:  3 mL, Rfl: 3   traMADol (ULTRAM) 50 MG tablet, Take 50 mg by mouth every 6 (six) hours as needed for moderate pain (pain score 4-6)., Disp: , Rfl:    Medications ordered in this encounter:  Meds ordered this encounter  Medications   meclizine (ANTIVERT) 25 MG tablet    Sig: Take 1 tablet (25 mg total) by mouth 3 (three) times daily as needed for dizziness.    Dispense:  30 tablet    Refill:  0    Supervising Provider:   LAMPTEY, PHILIP O [[id number]]   naproxen  (NAPROSYN ) 500 MG tablet    Sig: Take 1 tablet (500 mg total) by mouth 2 (two) times daily with a meal.    Dispense:  30 tablet    Refill:  0    Supervising Provider:   Corine Dice  [[id number]]     *If you need refills on other medications prior to your next appointment, please contact your pharmacy*  Follow-Up: Call back or seek an in-person evaluation if the symptoms worsen or if the condition fails to improve as anticipated.  [facility] [phone]  Other Instructions  Brandt-Daroff Exercise for Vertigo  The Brandt-Daroff exercise is one of several exercises that can speed up the compensation process and end the symptoms of vertigo. It often is prescribed for people who have benign paroxysmal positional vertigo (BPPV) and sometimes for labyrinthitis. These exercises won't cure these conditions. But over time they can reduce symptoms of vertigo.  People who use this exercise usually are told to do several repetitions of the exercise at least twice a day.  How It Is Done To do the Brandt-Daroff exercise:  Start in an upright, seated position. Move into the lying position on one side with your nose pointed up at about a 45-degree angle. Remain in this position for about 30 seconds (or until the vertigo subsides, whichever is longer), then move back to the seated position. Repeat steps 2 and 3 on the other side. What To Expect Symptoms sometimes suddenly go away during an exercise period. More often, improvement occurs gradually over a period of weeks or months.  Why It Is Done The Brandt-Daroff exercise and other similar exercises are used to treat BPPV. These exercises are sometimes used to treat labyrinthitis or vestibular neuritis.  How Well It Works These exercises can help your body get used to the confusing signals that are causing your vertigo. This may help you get over your vertigo sooner.  The Brandt-Daroff exercise does not help relieve the symptoms of benign paroxysmal positional vertigo (BPPV) as well as the Semont maneuver or the Epley maneuver.footnote1  Risks There are no risks in doing these exercises. To avoid hitting your  head or developing minor neck injuries, be careful not to lie down too quickly.  References Citations Fife TD, et al. (2008). Practice parameter: Therapies for benign paroxysmal positional vertigo (an evidence-based review). Report of the Quality Standards Subcommittee of the American Academy of Neurology. Neurology, 70(22): [phone]. Current as of: Nov 13, 2020    How to Perform the Epley Maneuver The Epley maneuver is an exercise that relieves symptoms of vertigo. Vertigo is the feeling that you or your surroundings are moving when they are not. When you feel vertigo, you may feel like the room is spinning and may have trouble walking. The Epley maneuver is used for a type of vertigo caused by a calcium  deposit in a part of the inner ear. The  maneuver involves changing head positions to help the deposit move out of the area. You can do this maneuver at home whenever you have symptoms of vertigo. You can repeat it in 24 hours if your vertigo has not gone away. Even though the Epley maneuver may relieve your vertigo for a few weeks, it is possible that your symptoms will return. This maneuver relieves vertigo, but it does not relieve dizziness. What are the risks? If it is done correctly, the Epley maneuver is considered safe. Sometimes it can lead to dizziness or nausea that goes away after a short time. If you develop other symptoms--such as changes in vision, weakness, or numbness--stop doing the maneuver and call your health care provider. Supplies needed: A bed or table. A pillow. How to do the Epley maneuver     Sit on the edge of a bed or table with your back straight and your legs extended or hanging over the edge of the bed or table. Turn your head halfway toward the affected ear or side as told by your health care provider. Lie backward quickly with your head turned until you are lying flat on your back. Your head should dangle (head-hanging position). You may want to position a  pillow under your shoulders. Hold this position for at least 30 seconds. If you feel dizzy or have symptoms of vertigo, continue to hold the position until the symptoms stop. Turn your head to the opposite direction until your unaffected ear is facing down. Your head should continue to dangle. Hold this position for at least 30 seconds. If you feel dizzy or have symptoms of vertigo, continue to hold the position until the symptoms stop. Turn your whole body to the same side as your head so that you are positioned on your side. Your head will now be nearly facedown and no longer needs to dangle. Hold for at least 30 seconds. If you feel dizzy or have symptoms of vertigo, continue to hold the position until the symptoms stop. Sit back up. You can repeat the maneuver in 24 hours if your vertigo does not go away. Follow these instructions at home: For 24 hours after doing the Epley maneuver: Keep your head in an upright position. When lying down to sleep or rest, keep your head raised (elevated) with two or more pillows. Avoid excessive neck movements. Activity Do not drive or use machinery if you feel dizzy. After doing the Epley maneuver, return to your normal activities as told by your health care provider. Ask your health care provider what activities are safe for you. General instructions Drink enough fluid to keep your urine pale yellow. Do not drink alcohol. Take over-the-counter and prescription medicines only as told by your health care provider. Keep all follow-up visits. This is important. Preventing vertigo symptoms Ask your health care provider if there is anything you should do at home to prevent vertigo. He or she may recommend that you: Keep your head elevated with two or more pillows while you sleep. Do not sleep on the side of your affected ear. Get up slowly from bed. Avoid sudden movements during the day. Avoid extreme head positions or movement, such as looking up or  bending over. Contact a health care provider if: Your vertigo gets worse. You have other symptoms, including: Nausea. Vomiting. Headache. Get help right away if you: Have vision changes. Have a headache or neck pain that is severe or getting worse. Cannot stop vomiting. Have new numbness or weakness in  any part of your body. These symptoms may represent a serious problem that is an emergency. Do not wait to see if the symptoms will go away. Get medical help right away. Call your local emergency services (911 in the [place] Do not drive yourself to the hospital. Summary Vertigo is the feeling that you or your surroundings are moving when they are not. The Epley maneuver is an exercise that relieves symptoms of vertigo. If the Epley maneuver is done correctly, it is considered safe. This information is not intended to replace advice given to you by your health care provider. Make sure you discuss any questions you have with your health care provider. Document Revised: 03/26/2023 Document Reviewed: 03/26/2023 Elsevier Patient Education  2024 Elsevier Inc.   If you have been instructed to have an in-person evaluation today at a local Urgent Care facility, please use the link below. It will take you to a list of all of our available [facility], including address, phone number and hours of operation. Please do not delay care.  [facility]  If you or a family member do not have a primary care provider, use the link below to schedule a visit and establish care. When you choose a [facility] primary care physician or advanced practice provider, you gain a long-term partner in health. Find a Primary Care Provider  Learn more about [facility]'s in-office and virtual care options: [facility] - Get Care Now

## 2023-12-24 NOTE — Progress Notes (Signed)
 Virtual Visit Consent   John [name], you are scheduled for a virtual visit with a [facility] provider today. Just as with appointments in the office, your consent must be obtained to participate. Your consent will be active for this visit and any virtual visit you may have with one of our providers in the next 365 days. If you have a MyChart account, a copy of this consent can be sent to you electronically.  As this is a virtual visit, video technology does not allow for your provider to perform a traditional examination. This may limit your provider's ability to fully assess your condition. If your provider identifies any concerns that need to be evaluated in person or the need to arrange testing (such as labs, EKG, etc.), we will make arrangements to do so. Although advances in technology are sophisticated, we cannot ensure that it will always work on either your end or our end. If the connection with a video visit is poor, the visit may have to be switched to a telephone visit. With either a video or telephone visit, we are not always able to ensure that we have a secure connection.  By engaging in this virtual visit, you consent to the provision of healthcare and authorize for your insurance to be billed (if applicable) for the services provided during this visit. Depending on your insurance coverage, you may receive a charge related to this service.  I need to obtain your verbal consent now. Are you willing to proceed with your visit today? John [name] has provided verbal consent on 12/24/2023 for a virtual visit (video or telephone). Angelia Kelp, PA-C  Date: 12/24/2023 11:48 AM   Virtual Visit via Video Note   I, Angelia Kelp, connected with  John [name]  ([id number], February 24, 1966) on 12/24/23 at  9:15 AM EDT by a video-enabled telemedicine application and verified that I am speaking with the correct person using two identifiers.  Location: Patient: Virtual Visit Location  Patient: Home Provider: Virtual Visit Location Provider: Home Office   I discussed the limitations of evaluation and management by telemedicine and the availability of in person appointments. The patient expressed understanding and agreed to proceed.    History of Present Illness: John [name] is a 58 y.o. who identifies as a male who was assigned male at birth, and is being seen today for vertigo and neck pain. He was seen yesterday for the same with his PCP office. He reports dizziness is mostly when he turns his head to the left or lies down and rolls to the left. He reports the room spins and he can feel his eyes moving side to side. He was given Zofran  for the nausea and referred to vestibular therapy. It had been thought this may be a medication side effect to Losartan , so this was also changed to Lisinopril .   He was also seen for possible cervical radiculopathy. Had been seen for this in the past at [facility] in-person on 12/11/23 and given Meloxicam  (reports no improvement). Cervical spine on same day showed Nonspecific reversal of the usual cervical lordosis. Moderate degenerative changes. No acute displaced fractures identified. When he was seen by his PCP yesterday he was given Voltaren  gel, advised can also use Tylenol , and referred to Sports Medicine.   He reports no improvement with Voltaren  gel and would like something else for pain.    Problems:  Patient Active Problem List   Diagnosis Date Noted   Shoulder pain, bilateral  01/11/2023   BPPV (benign paroxysmal positional vertigo) 01/11/2023   Left cervical radiculopathy 09/29/2022   Healthcare maintenance 12/03/2021   Erectile dysfunction 02/28/2020   Hyperlipidemia associated with type 2 diabetes mellitus (HCC) 10/18/2019   Type 2 diabetes mellitus with unspecified complications (HCC) 07/26/2019   Schizophrenia (HCC) 07/25/2019   Anxiety disorder 07/25/2019   OSA (obstructive sleep apnea) 08/17/2013   Morbid obesity (HCC)  08/17/2013   Hypertension 08/17/2013    Allergies: No Known Allergies Medications:  Current Outpatient Medications:    meclizine (ANTIVERT) 25 MG tablet, Take 1 tablet (25 mg total) by mouth 3 (three) times daily as needed for dizziness., Disp: 30 tablet, Rfl: 0   naproxen  (NAPROSYN ) 500 MG tablet, Take 1 tablet (500 mg total) by mouth 2 (two) times daily with a meal., Disp: 30 tablet, Rfl: 0   Accu-Chek Softclix Lancets lancets, USE CHECK GLUCOSE ONCE TO TWICE DAILY, Disp: 100 each, Rfl: 3   amLODipine  (NORVASC ) 10 MG tablet, Take 1 tablet (10 mg total) by mouth at bedtime., Disp: 90 tablet, Rfl: 3   baclofen  (LIORESAL ) 10 MG tablet, Take 1 tablet (10 mg total) by mouth 2 (two) times daily as needed for muscle spasms., Disp: 20 each, Rfl: 0   BD PEN NEEDLE NANO 2ND GEN 32G X 4 MM MISC, USE NEEDLE WITH INSULIN  PEN TO INJECT YOUR INSULIN  AS DIRECTED, Disp: 100 each, Rfl: 3   Blood Glucose Monitoring Suppl (ACCU-CHEK GUIDE) w/Device KIT, Use to check blood sugars 1-2 times daily., Disp: 1 kit, Rfl: 0   cetirizine  (ZYRTEC ) 10 MG tablet, Take 1 tablet by mouth daily., Disp: , Rfl:    diclofenac  Sodium (VOLTAREN  ARTHRITIS PAIN) 1 % GEL, Apply 4 g topically 4 (four) times daily., Disp: 4 g, Rfl: 0   Elastic Bandages & Supports (WRIST SPLINT/ELASTIC RIGHT XL) MISC, 1 Application by Does not apply route daily., Disp: 1 each, Rfl: 0   fluticasone  (FLONASE ) 50 MCG/ACT nasal spray, Place 2 sprays into both nostrils daily., Disp: 16 g, Rfl: 6   gabapentin  (NEURONTIN ) 100 MG capsule, Take 1 capsule (100 mg total) by mouth 3 (three) times daily., Disp: 90 capsule, Rfl: 1   glucose blood (ACCU-CHEK GUIDE TEST) test strip, Use to check blood sugar once to twice daily, Disp: 100 each, Rfl: 3   JARDIANCE  10 MG TABS tablet, TAKE 1 TABLET BY MOUTH ONCE DAILY BEFORE BREAKFAST, Disp: 30 tablet, Rfl: 0   LANTUS  SOLOSTAR 100 UNIT/ML Solostar Pen, INJECT 25 UNITS SUBCUTANEOUSLY ONCE DAILY, Disp: 30 mL, Rfl: 0    lidocaine  (LIDODERM ) 5 %, Place 1 patch onto the skin daily. Remove & Discard patch within 12 hours or as directed by MD, Disp: 30 patch, Rfl: 0   lisinopril  (ZESTRIL ) 20 MG tablet, Take 1 tablet (20 mg total) by mouth daily., Disp: 30 tablet, Rfl: 11   omeprazole  (PRILOSEC) 20 MG capsule, Take 1 capsule (20 mg total) by mouth daily before breakfast., Disp: 30 capsule, Rfl: 0   ondansetron  (ZOFRAN ) 4 MG tablet, Take 1 tablet (4 mg total) by mouth daily as needed for nausea or vomiting., Disp: 20 tablet, Rfl: 0   rosuvastatin  (CRESTOR ) 20 MG tablet, Take 1 tablet by mouth once daily, Disp: 90 tablet, Rfl: 0   Semaglutide , 1 MG/DOSE, 4 MG/3ML SOPN, Inject 1 mg into the skin once a week., Disp: 3 mL, Rfl: 3   traMADol (ULTRAM) 50 MG tablet, Take 50 mg by mouth every 6 (six) hours as needed for moderate pain (pain  score 4-6)., Disp: , Rfl:   Observations/Objective: Patient is well-developed, well-nourished in no acute distress.  Resting comfortably at home.  Head is normocephalic, atraumatic.  No labored breathing.  Speech is clear and coherent with logical content.  Patient is alert and oriented at baseline.    Assessment and Plan: 1. Benign paroxysmal positional vertigo of left ear (Primary) - meclizine (ANTIVERT) 25 MG tablet; Take 1 tablet (25 mg total) by mouth 3 (three) times daily as needed for dizziness.  Dispense: 30 tablet; Refill: 0  2. Neck pain - naproxen  (NAPROSYN ) 500 MG tablet; Take 1 tablet (500 mg total) by mouth 2 (two) times daily with a meal.  Dispense: 30 tablet; Refill: 0  - Meclizine added for BPPV - Discussed Epley Maneuver and Brandt-Daroff exercises (written info provided in AVS) - Added Naproxen  for neck pain, avoiding steroids at this time due to slight increase in A1c - Keep scheduled follow up with referral made - Seek in person evaluation if worsening over the weekend  Follow Up Instructions: I discussed the assessment and treatment plan with the patient.  The patient was provided an opportunity to ask questions and all were answered. The patient agreed with the plan and demonstrated an understanding of the instructions.  A copy of instructions were sent to the patient via MyChart unless otherwise noted below.    The patient was advised to call back or seek an in-person evaluation if the symptoms worsen or if the condition fails to improve as anticipated.    Angelia Kelp, PA-C

## 2023-12-27 ENCOUNTER — Encounter: Payer: Self-pay | Admitting: *Deleted

## 2023-12-28 ENCOUNTER — Other Ambulatory Visit: Payer: Self-pay

## 2023-12-28 ENCOUNTER — Ambulatory Visit (INDEPENDENT_AMBULATORY_CARE_PROVIDER_SITE_OTHER): Admitting: Family Medicine

## 2023-12-28 ENCOUNTER — Encounter: Payer: Self-pay | Admitting: Family Medicine

## 2023-12-28 ENCOUNTER — Ambulatory Visit (HOSPITAL_COMMUNITY)
Admission: RE | Admit: 2023-12-28 | Discharge: 2023-12-28 | Disposition: A | Discharge status: Home / Self Care | Source: Ambulatory Visit

## 2023-12-28 ENCOUNTER — Encounter (HOSPITAL_COMMUNITY): Payer: Self-pay

## 2023-12-28 VITALS — BP 142/87 | Ht 71.0 in | Wt 340.0 lb

## 2023-12-28 VITALS — BP 136/89 | HR 88 | Temp 99.0°F | Resp 18

## 2023-12-28 DIAGNOSIS — M5412 Radiculopathy, cervical region: Secondary | ICD-10-CM

## 2023-12-28 DIAGNOSIS — R42 Dizziness and giddiness: Secondary | ICD-10-CM | POA: Diagnosis not present

## 2023-12-28 LAB — POCT FASTING CBG KUC MANUAL ENTRY: POCT Glucose (KUC): 140 mg/dL — AB (ref 70–99)

## 2023-12-28 MED ORDER — ONDANSETRON 4 MG PO TBDP
4.0000 mg | ORAL_TABLET | Freq: Once | ORAL | Status: AC
  Administered 2023-12-28: 4 mg via ORAL

## 2023-12-28 MED ORDER — ONDANSETRON 4 MG PO TBDP
ORAL_TABLET | ORAL | Status: AC
  Filled 2023-12-28: qty 1

## 2023-12-28 MED ORDER — PREDNISONE 10 MG PO TABS: ORAL_TABLET | ORAL | 0 refills | Status: DC

## 2023-12-28 MED ORDER — ONDANSETRON 4 MG PO TBDP: 4.0000 mg | ORAL_TABLET | Freq: Three times a day (TID) | ORAL | 0 refills | Status: AC | PRN

## 2023-12-28 NOTE — ED Provider Notes (Signed)
 [facility]  Note:  This document was prepared using Dragon voice recognition software and may include unintentional dictation errors.  MRN: [id number] DOB: [birth year]  Subjective:   John [name] is a 58 y.o. male presenting for nausea and dizziness x 3 days.  Patient reports that he has extreme dizziness when laying on his left side.  Patient reports that the room starts spinning.  Patient reports the dizziness has been ongoing but nausea is a new symptom that started this morning.  Patient states that he has meclizine at home which he took yesterday which caused him to sleep during the day for about 3 hours.  Patient still having dizziness today.  No shortness of breath, chest pain, weakness, fever, lethargy, vomiting, diarrhea.  No current facility-administered medications for this encounter.  Current Outpatient Medications:    ondansetron  (ZOFRAN -ODT) 4 MG disintegrating tablet, Take 1 tablet (4 mg total) by mouth every 8 (eight) hours as needed for nausea or vomiting., Disp: 20 tablet, Rfl: 0   Accu-Chek Softclix Lancets lancets, USE CHECK GLUCOSE ONCE TO TWICE DAILY, Disp: 100 each, Rfl: 3   amLODipine  (NORVASC ) 10 MG tablet, Take 1 tablet (10 mg total) by mouth at bedtime., Disp: 90 tablet, Rfl: 3   baclofen  (LIORESAL ) 10 MG tablet, Take 1 tablet (10 mg total) by mouth 2 (two) times daily as needed for muscle spasms., Disp: 20 each, Rfl: 0   BD PEN NEEDLE NANO 2ND GEN 32G X 4 MM MISC, USE NEEDLE WITH INSULIN  PEN TO INJECT YOUR INSULIN  AS DIRECTED, Disp: 100 each, Rfl: 3   Blood Glucose Monitoring Suppl (ACCU-CHEK GUIDE) w/Device KIT, Use to check blood sugars 1-2 times daily., Disp: 1 kit, Rfl: 0   cetirizine  (ZYRTEC ) 10 MG tablet, Take 1 tablet by mouth daily., Disp: , Rfl:    diclofenac  Sodium (VOLTAREN  ARTHRITIS PAIN) 1 % GEL, Apply 4 g topically 4 (four) times daily., Disp: 4 g, Rfl: 0   Elastic Bandages & Supports (WRIST SPLINT/ELASTIC RIGHT XL) MISC, 1 Application  by Does not apply route daily., Disp: 1 each, Rfl: 0   fluticasone  (FLONASE ) 50 MCG/ACT nasal spray, Place 2 sprays into both nostrils daily., Disp: 16 g, Rfl: 6   gabapentin  (NEURONTIN ) 100 MG capsule, Take 1 capsule (100 mg total) by mouth 3 (three) times daily., Disp: 90 capsule, Rfl: 1   glucose blood (ACCU-CHEK GUIDE TEST) test strip, Use to check blood sugar once to twice daily, Disp: 100 each, Rfl: 3   JARDIANCE  10 MG TABS tablet, TAKE 1 TABLET BY MOUTH ONCE DAILY BEFORE BREAKFAST, Disp: 30 tablet, Rfl: 0   LANTUS  SOLOSTAR 100 UNIT/ML Solostar Pen, INJECT 25 UNITS SUBCUTANEOUSLY ONCE DAILY, Disp: 30 mL, Rfl: 0   lidocaine  (LIDODERM ) 5 %, Place 1 patch onto the skin daily. Remove & Discard patch within 12 hours or as directed by MD, Disp: 30 patch, Rfl: 0   lisinopril  (ZESTRIL ) 20 MG tablet, Take 1 tablet (20 mg total) by mouth daily., Disp: 30 tablet, Rfl: 11   meclizine (ANTIVERT) 25 MG tablet, Take 1 tablet (25 mg total) by mouth 3 (three) times daily as needed for dizziness., Disp: 30 tablet, Rfl: 0   naproxen  (NAPROSYN ) 500 MG tablet, Take 1 tablet (500 mg total) by mouth 2 (two) times daily with a meal., Disp: 30 tablet, Rfl: 0   omeprazole  (PRILOSEC) 20 MG capsule, Take 1 capsule (20 mg total) by mouth daily before breakfast., Disp: 30 capsule, Rfl: 0   ondansetron  (ZOFRAN ) 4  MG tablet, Take 1 tablet (4 mg total) by mouth daily as needed for nausea or vomiting., Disp: 20 tablet, Rfl: 0   predniSONE  (DELTASONE ) 10 MG tablet, Use as directed per doctors orders for the next 6 days., Disp: 21 tablet, Rfl: 0   rosuvastatin  (CRESTOR ) 20 MG tablet, Take 1 tablet by mouth once daily, Disp: 90 tablet, Rfl: 0   Semaglutide , 1 MG/DOSE, 4 MG/3ML SOPN, Inject 1 mg into the skin once a week., Disp: 3 mL, Rfl: 3   traMADol (ULTRAM) 50 MG tablet, Take 50 mg by mouth every 6 (six) hours as needed for moderate pain (pain score 4-6)., Disp: , Rfl:    No Known Allergies  Past Medical History:  Diagnosis  Date   Anxiety disorder    Erectile dysfunction    Hyperlipidemia    Hypertension    Obesity, morbid, BMI 50 or higher (HCC)    Poorly controlled type 2 diabetes mellitus (HCC)    Schizophrenia (HCC)    Sleep apnea      Past Surgical History:  Procedure Laterality Date   ANKLE SURGERY     APPENDECTOMY      Family History  Problem Relation Age of Onset   Breast cancer Mother    Alcohol abuse Father    Hypertension Sister    Breast cancer Sister    Hypertension Brother    Diabetes type II Brother    Colon cancer Paternal Uncle        possible but not sure   Colon polyps Neg Hx    Rectal cancer Neg Hx    Stomach cancer Neg Hx     Social History   Tobacco Use   Smoking status: Never   Smokeless tobacco: Never  Vaping Use   Vaping status: Never Used  Substance Use Topics   Alcohol use: Yes    Comment: rare in the last two years, 1x in 2-3 months   Drug use: Yes    Types: Marijuana    Comment: Occasional    ROS Refer to HPI for ROS details.  Objective:   Vitals: BP 136/89   Pulse 88   Temp 99 F (37.2 C) (Oral)   Resp 18   SpO2 95%   Physical Exam Vitals and nursing note reviewed.  Constitutional:      General: He is not in acute distress.    Appearance: Normal appearance. He is well-developed. He is not ill-appearing or toxic-appearing.  HENT:     Head: Normocephalic.     Right Ear: Tympanic membrane, ear canal and external ear normal.     Left Ear: Tympanic membrane, ear canal and external ear normal.     Nose: Nose normal.   Cardiovascular:     Rate and Rhythm: Normal rate and regular rhythm.     Heart sounds: Normal heart sounds. No murmur heard. Pulmonary:     Effort: Pulmonary effort is normal. No respiratory distress.     Breath sounds: Normal breath sounds. No stridor. No wheezing, rhonchi or rales.  Chest:     Chest wall: No tenderness.   Skin:    General: Skin is warm and dry.   Neurological:     General: No focal deficit  present.     Mental Status: He is alert and oriented to person, place, and time.   Psychiatric:        Mood and Affect: Mood normal.        Behavior: Behavior normal.  Procedures  Results for orders placed or performed during the hospital encounter of 12/28/23 (from the past 24 hours)  POC CBG monitoring     Status: Abnormal   Collection Time: 12/28/23  4:42 PM  Result Value Ref Range   POCT Glucose ([facility]) 140 (A) 70 - 99 mg/dL    No results found.   Assessment and Plan :     Discharge Instructions       1. Vertigo (Primary) - ED EKG completed in UC shows ventricular rate of 93 bpm, normal sinus rhythm, no STEMI. - POC CBG monitoring shows a glucose of 140 mg/dL. - ondansetron  (ZOFRAN -ODT) disintegrating tablet 4 mg given in UC for acute nausea symptoms secondary to positional vertigo - ondansetron  (ZOFRAN -ODT) 4 MG disintegrating tablet; Take 1 tablet (4 mg total) by mouth every 8 (eight) hours as needed for nausea or vomiting.  Dispense: 20 tablet; Refill: 0 - Follow-up with ENT if symptoms persist for further evaluation and manage treatment and possible need for Epley maneuver to treat positional vertigo. -Continue to monitor symptoms for any change in severity if there is any escalation of current symptoms or development of new symptoms follow-up in ER for further evaluation and management.      Annita Ratliff B Sharisse Rantz   Amorina Doerr, [place], [place] 12/28/23 1740

## 2023-12-28 NOTE — Patient Instructions (Addendum)
 Call [facility] and have them send your notes and MRI report to Dr. Ona Bidding office. Their fax is [phone]  If you can get a copy of the images on a disc to bring to your appt that would be helpful.

## 2023-12-28 NOTE — Discharge Instructions (Addendum)
  1. Vertigo (Primary) - ED EKG completed in UC shows ventricular rate of 93 bpm, normal sinus rhythm, no STEMI. - POC CBG monitoring shows a glucose of 140 mg/dL. - ondansetron  (ZOFRAN -ODT) disintegrating tablet 4 mg given in UC for acute nausea symptoms secondary to positional vertigo - ondansetron  (ZOFRAN -ODT) 4 MG disintegrating tablet; Take 1 tablet (4 mg total) by mouth every 8 (eight) hours as needed for nausea or vomiting.  Dispense: 20 tablet; Refill: 0 - Follow-up with ENT if symptoms persist for further evaluation and manage treatment and possible need for Epley maneuver to treat positional vertigo. -Continue to monitor symptoms for any change in severity if there is any escalation of current symptoms or development of new symptoms follow-up in ER for further evaluation and management.

## 2023-12-28 NOTE — Progress Notes (Signed)
 PCP: John John [name], John [name]  Subjective:   HPI: Patient is a 58 y.o. male here for left neck and shoulder pain.  John John [name] states that 1 month ago he began having left-sided neck pain. The pain radiates from is neck down his arm and into his dorsal hand. It is a burning made worse by laying on his left side, looking to either side, and doing overhead activities. He has not noticed any weakness in his hands. On 12/11/23 he presented to urgent care with this concern and xrays at the time were only significant for mild degenerative changes to the cervical spine. He was sent home with a Mobic  prescription. Mobic  and gabapentin  were minimally effective at treating his pain. He has had pain similar to this in his right arm, and at that time a neck steroid injection led to 100% improvement in his pain. He has not had any unintentional weight loss, fevers, chills. No history of trauma to the shoulder or neck.    Past Medical History:  Diagnosis Date   Anxiety disorder    Erectile dysfunction    Hyperlipidemia    Hypertension    Obesity, morbid, BMI 50 or higher (HCC)    Poorly controlled type 2 diabetes mellitus (HCC)    Schizophrenia (HCC)    Sleep apnea     Current Outpatient Medications on File Prior to Visit  Medication Sig Dispense Refill   Accu-Chek Softclix Lancets lancets USE CHECK GLUCOSE ONCE TO TWICE DAILY 100 each 3   amLODipine  (NORVASC ) 10 MG tablet Take 1 tablet (10 mg total) by mouth at bedtime. 90 tablet 3   baclofen  (LIORESAL ) 10 MG tablet Take 1 tablet (10 mg total) by mouth 2 (two) times daily as needed for muscle spasms. 20 each 0   BD PEN NEEDLE NANO 2ND GEN 32G X 4 MM MISC USE NEEDLE WITH INSULIN  PEN TO INJECT YOUR INSULIN  AS DIRECTED 100 each 3   Blood Glucose Monitoring Suppl (ACCU-CHEK GUIDE) w/Device KIT Use to check blood sugars 1-2 times daily. 1 kit 0   cetirizine  (ZYRTEC ) 10 MG tablet Take 1 tablet by mouth daily.     diclofenac  Sodium (VOLTAREN  ARTHRITIS PAIN) 1 % GEL Apply  4 g topically 4 (four) times daily. 4 g 0   Elastic Bandages & Supports (WRIST SPLINT/ELASTIC RIGHT XL) MISC 1 Application by Does not apply route daily. 1 each 0   fluticasone  (FLONASE ) 50 MCG/ACT nasal spray Place 2 sprays into both nostrils daily. 16 g 6   gabapentin  (NEURONTIN ) 100 MG capsule Take 1 capsule (100 mg total) by mouth 3 (three) times daily. 90 capsule 1   glucose blood (ACCU-CHEK GUIDE TEST) test strip Use to check blood sugar once to twice daily 100 each 3   JARDIANCE  10 MG TABS tablet TAKE 1 TABLET BY MOUTH ONCE DAILY BEFORE BREAKFAST 30 tablet 0   LANTUS  SOLOSTAR 100 UNIT/ML Solostar Pen INJECT 25 UNITS SUBCUTANEOUSLY ONCE DAILY 30 mL 0   lidocaine  (LIDODERM ) 5 % Place 1 patch onto the skin daily. Remove & Discard patch within 12 hours or as directed by John [name] 30 patch 0   lisinopril  (ZESTRIL ) 20 MG tablet Take 1 tablet (20 mg total) by mouth daily. 30 tablet 11   meclizine (ANTIVERT) 25 MG tablet Take 1 tablet (25 mg total) by mouth 3 (three) times daily as needed for dizziness. 30 tablet 0   naproxen  (NAPROSYN ) 500 MG tablet Take 1 tablet (500 mg total) by mouth 2 (two) times daily with  a meal. 30 tablet 0   omeprazole  (PRILOSEC) 20 MG capsule Take 1 capsule (20 mg total) by mouth daily before breakfast. 30 capsule 0   ondansetron  (ZOFRAN ) 4 MG tablet Take 1 tablet (4 mg total) by mouth daily as needed for nausea or vomiting. 20 tablet 0   rosuvastatin  (CRESTOR ) 20 MG tablet Take 1 tablet by mouth once daily 90 tablet 0   Semaglutide , 1 MG/DOSE, 4 MG/3ML SOPN Inject 1 mg into the skin once a week. 3 mL 3   traMADol (ULTRAM) 50 MG tablet Take 50 mg by mouth every 6 (six) hours as needed for moderate pain (pain score 4-6).     No current facility-administered medications on file prior to visit.    Past Surgical History:  Procedure Laterality Date   ANKLE SURGERY     APPENDECTOMY      No Known Allergies  BP (!) 142/87   Ht 5' 11 (1.803 m)   Wt (!) 340 lb (154.2 kg)    BMI 47.42 kg/m       No data to display              No data to display              Objective:  Physical Exam:  Gen: NAD, comfortable in exam room  MSK:  Cervical Spine Inspection: No bruises, swelling.  Palpation: No tenderness along vertebral bodies, paraspinals ROM: Flexion 20, extension 10  Special Tests: Spurling positive, forward flexion test negative  Left Shoulder Inspection: No bruises, swelling, deformities Palpation: Mild tenderness diffusely to shoulder girdle, clavicle, scapula ROM: Flex 100, ABD 100, ER 60 Strength: Flex 5/5, ABD 5/5, ER 5/5, IR 5/5 Special Tests: Neers negative, Hawkins negative, Empty Can negative, Scarf positive, AC Shear negative, O'Brien's negative   Assessment & Plan:  Patient is a 58 y.o. male here for left sided neck and shoulder pain. His dermatomal burning, xray significant for some degenerative changes, and history of similar symptoms on the right side make cervical radiculopathy the most likely diagnosis. Low suspicion for rotator cuff pathology given his negative shoulder testing.   1. Cervical radiculopathy - Referral for intraforaminal steroid injection. Asked patient to call [facility] and fax his MRI results - Prednisone  6 day taper - Isometric exercises for cervical spine - Will follow-up as needed  John John [name], John John [name] - John John [name]  I saw and independently evaluated patient, I agree with John John [name], patient presenting with cervical radiculitis now on the left side. I suspect this is an acute on chronic issue.  Patient had complete resolution of his symptoms on the right side via John John [name].  Will go ahead and refer patient to get an John John [name] on the left side.  Patient was advised to send his MRI results to physician who performed procedure.  Patient will also be on a prednisone  pack for his current cervical radiculitis symptoms.  John John [name]

## 2023-12-28 NOTE — ED Triage Notes (Signed)
 Pt present with nausea since this morning.   States when laying on his lt side he feels the room is spinning. It has been going on for three days.

## 2023-12-29 NOTE — Telephone Encounter (Signed)
 I attempted to call the patient x2 to confirm if he was able to pick up his medication, unable to reach the patient. I called the pharmacy and spoke to [place] she stated the patient picked up his medication.

## 2023-12-30 ENCOUNTER — Telehealth: Payer: Self-pay

## 2023-12-30 NOTE — Telephone Encounter (Signed)
 I called and spoke to the patient regarding a referral to a ENT, I advised the patient that the UC placed the referral not us . The patient has been scheduled for 9/11 with the [facility]NT. Patient stated he can't wait until September to be seen due to having dizziness. Patient is schedule to see us  on 6/30 for a new referral to be placed to be seen sooner at a different practice. Patient will give us  a call back to see if we have any cancellations.

## 2023-12-30 NOTE — Progress Notes (Signed)
 Internal Medicine Clinic Attending  Case discussed with the resident at the time of the visit.  We reviewed the resident's history and exam and pertinent patient test results.  I agree with the assessment, diagnosis, and plan of care documented in the resident's note.

## 2023-12-30 NOTE — Telephone Encounter (Signed)
 Copied from CRM [id number]. Topic: Referral - Status >> Dec 30, 2023 10:00 AM Retta Caster wrote: Reason for CRM: Ambulatory referral to ENT (Order [phone] need call on update from PEND to [facility] referral. [phone]

## 2024-01-01 ENCOUNTER — Other Ambulatory Visit: Payer: Self-pay | Admitting: Internal Medicine

## 2024-01-01 DIAGNOSIS — E118 Type 2 diabetes mellitus with unspecified complications: Secondary | ICD-10-CM

## 2024-01-03 NOTE — Addendum Note (Signed)
 Addended by: Gibran Veselka on: 01/03/2024 08:41 AM   Modules accepted: Orders

## 2024-01-03 NOTE — Telephone Encounter (Addendum)
 Unable to refill rx, rx keeps reverting back to print instead of normal.

## 2024-01-04 MED ORDER — LANTUS SOLOSTAR 100 UNIT/ML ~~LOC~~ SOPN: 25.0000 [IU] | PEN_INJECTOR | Freq: Every day | SUBCUTANEOUS | 1 refills | Status: AC

## 2024-01-05 ENCOUNTER — Other Ambulatory Visit: Payer: Self-pay | Admitting: Internal Medicine

## 2024-01-05 NOTE — Telephone Encounter (Signed)
 Medication sent to pharmacy

## 2024-01-10 ENCOUNTER — Encounter: Admitting: Student

## 2024-01-10 ENCOUNTER — Ambulatory Visit: Admitting: Student

## 2024-01-10 VITALS — BP 154/100 | HR 107 | Temp 98.3°F | Ht 71.0 in | Wt 340.8 lb

## 2024-01-10 DIAGNOSIS — Z7984 Long term (current) use of oral hypoglycemic drugs: Secondary | ICD-10-CM

## 2024-01-10 DIAGNOSIS — E118 Type 2 diabetes mellitus with unspecified complications: Secondary | ICD-10-CM

## 2024-01-10 DIAGNOSIS — I10 Essential (primary) hypertension: Secondary | ICD-10-CM | POA: Diagnosis not present

## 2024-01-10 DIAGNOSIS — Z7985 Long-term (current) use of injectable non-insulin antidiabetic drugs: Secondary | ICD-10-CM

## 2024-01-10 MED ORDER — LOSARTAN POTASSIUM-HCTZ 50-12.5 MG PO TABS
1.0000 | ORAL_TABLET | Freq: Every day | ORAL | 11 refills | Status: AC
Start: 2024-01-10 — End: 2025-01-09

## 2024-01-10 NOTE — Addendum Note (Signed)
 Addended by: RENNE HOMANS on: 01/10/2024 03:52 PM   Modules accepted: Orders

## 2024-01-10 NOTE — Progress Notes (Signed)
 CC: Routine clinic visit  HPI:  [name] is a 58 y.o. male living with a history stated below and presents today for routine clinic visit and also discuss his other chronic conditions. Please see problem based assessment and plan for additional details.  Past Medical History:  Diagnosis Date   Anxiety disorder    Erectile dysfunction    Hyperlipidemia    Hypertension    Obesity, morbid, BMI 50 or higher (HCC)    Poorly controlled type 2 diabetes mellitus (HCC)    Schizophrenia (HCC)    Sleep apnea     Current Outpatient Medications on File Prior to Visit  Medication Sig Dispense Refill   Accu-Chek Softclix Lancets lancets USE CHECK GLUCOSE ONCE TO TWICE DAILY 100 each 3   amLODipine  (NORVASC ) 10 MG tablet Take 1 tablet (10 mg total) by mouth at bedtime. 90 tablet 3   baclofen  (LIORESAL ) 10 MG tablet Take 1 tablet (10 mg total) by mouth 2 (two) times daily as needed for muscle spasms. 20 each 0   BD PEN NEEDLE NANO 2ND GEN 32G X 4 MM MISC USE NEEDLE WITH INSULIN  PEN TO INJECT YOUR INSULIN  AS DIRECTED 100 each 3   Blood Glucose Monitoring Suppl (ACCU-CHEK GUIDE) w/Device KIT Use to check blood sugars 1-2 times daily. 1 kit 0   cetirizine  (ZYRTEC ) 10 MG tablet Take 1 tablet by mouth daily.     diclofenac  Sodium (VOLTAREN  ARTHRITIS PAIN) 1 % GEL Apply 4 g topically 4 (four) times daily. 4 g 0   Elastic Bandages & Supports (WRIST SPLINT/ELASTIC RIGHT XL) MISC 1 Application by Does not apply route daily. 1 each 0   fluticasone  (FLONASE ) 50 MCG/ACT nasal spray Place 2 sprays into both nostrils daily. 16 g 6   gabapentin  (NEURONTIN ) 100 MG capsule Take 1 capsule (100 mg total) by mouth 3 (three) times daily. 90 capsule 1   glucose blood (ACCU-CHEK GUIDE TEST) test strip Use to check blood sugar once to twice daily 100 each 3   JARDIANCE  10 MG TABS tablet TAKE 1 TABLET BY MOUTH ONCE DAILY BEFORE BREAKFAST 30 tablet 0   LANTUS  SOLOSTAR 100 UNIT/ML Solostar Pen Inject 25 Units into  the skin at bedtime. 30 mL 1   lidocaine  (LIDODERM ) 5 % Place 1 patch onto the skin daily. Remove & Discard patch within 12 hours or as directed by MD 30 patch 0   meclizine  (ANTIVERT ) 25 MG tablet Take 1 tablet (25 mg total) by mouth 3 (three) times daily as needed for dizziness. 30 tablet 0   omeprazole  (PRILOSEC) 20 MG capsule Take 1 capsule (20 mg total) by mouth daily before breakfast. 30 capsule 0   ondansetron  (ZOFRAN ) 4 MG tablet Take 1 tablet (4 mg total) by mouth daily as needed for nausea or vomiting. 20 tablet 0   ondansetron  (ZOFRAN -ODT) 4 MG disintegrating tablet Take 1 tablet (4 mg total) by mouth every 8 (eight) hours as needed for nausea or vomiting. 20 tablet 0   rosuvastatin  (CRESTOR ) 20 MG tablet Take 1 tablet by mouth once daily 90 tablet 0   Semaglutide , 1 MG/DOSE, 4 MG/3ML SOPN Inject 1 mg into the skin once a week. 3 mL 3   No current facility-administered medications on file prior to visit.    Family History  Problem Relation Age of Onset   Breast cancer Mother    Alcohol abuse Father    Hypertension Sister    Breast cancer Sister    Hypertension  Brother    Diabetes type II Brother    Colon cancer Paternal Uncle        possible but not sure   Colon polyps Neg Hx    Rectal cancer Neg Hx    Stomach cancer Neg Hx     Social History   Socioeconomic History   Marital status: Widowed    Spouse name: Not on file   Number of children: Not on file   Years of education: Not on file   Highest education level: Not on file  Occupational History   Not on file  Tobacco Use   Smoking status: Never   Smokeless tobacco: Never  Vaping Use   Vaping status: Never Used  Substance and Sexual Activity   Alcohol use: Yes    Comment: rare in the last two years, 1x in 2-3 months   Drug use: Yes    Types: Marijuana    Comment: Occasional   Sexual activity: Yes  Other Topics Concern   Not on file  Social History Narrative   Not on file   Social Drivers of Health    Financial Resource Strain: Medium Risk (10/20/2023)   Overall Financial Resource Strain (CARDIA)    Difficulty of Paying Living Expenses: Somewhat hard  Food Insecurity: No Food Insecurity (10/20/2023)   Hunger Vital Sign    Worried About Running Out of Food in the Last Year: Never true    Ran Out of Food in the Last Year: Never true  Transportation Needs: Unmet Transportation Needs (10/20/2023)   PRAPARE - Transportation    Lack of Transportation (Medical): Yes    Lack of Transportation (Non-Medical): Yes  Physical Activity: Sufficiently Active (10/20/2023)   Exercise Vital Sign    Days of Exercise per Week: 7 days    Minutes of Exercise per Session: 30 min  Stress: Stress Concern Present (10/20/2023)   [employer] of Occupational Health - Occupational Stress Questionnaire    Feeling of Stress : Rather much  Social Connections: Moderately Isolated (10/20/2023)   Social Connection and Isolation Panel    Frequency of Communication with Friends and Family: More than three times a week    Frequency of Social Gatherings with Friends and Family: More than three times a week    Attends Religious Services: More than 4 times per year    Active Member of [employer] or Organizations: No    Attends Banker Meetings: Never    Marital Status: Widowed  Intimate Partner Violence: Not At Risk (10/20/2023)   Humiliation, Afraid, Rape, and Kick questionnaire    Fear of Current or Ex-Partner: No    Emotionally Abused: No    Physically Abused: No    Sexually Abused: No    Review of Systems: ROS negative except for what is noted on the assessment and plan.  Vitals:   01/10/24 1406  BP: (!) 154/100  Pulse: (!) 107  Temp: 98.3 F (36.8 C)  TempSrc: Oral  Weight: (!) 340 lb 12.8 oz (154.6 kg)  Height: 5' 11 (1.803 m)    Physical Exam: Constitutional: Obese appearing man sitting in the chair. HENT: Large neck circumference Cardiovascular: regular rate and rhythm,  Pulmonary/Chest:  normal work of breathing on room air Neurological: alert & oriented x 3 Skin: warm and dry Psych: normal mood and behavior  Assessment & Plan:   Type 2 diabetes mellitus with unspecified complications (HCC) Lab Results  Component Value Date   HGBA1C 6.9 (A) 12/23/2023   HGBA1C  6.2 (A) 08/26/2023   HGBA1C 8.1 (A) 01/11/2023  John [name] has a history of diabetes. Fortunately, his hemoglobin A1c was at goal (6.9) as of two weeks ago. He has intentionally tapered off his antidiabetic medications, reducing his Ozempic  dose from 2 mg to 1 mg, with the goal of discontinuing it entirely in the future if A1c remains at goal. He is currently taking Jardiance  10 mg daily. A microalbumin test will be done today. If results indicate proteinuria, consider increasing the Jardiance  dosage at that time. -  Urine micro albumin - Continue Ozempic  1 mg weekly - Continue Jardiance  10 mg  Hypertension BP Readings from Last 3 Encounters:  01/10/24 (!) 154/100  12/28/23 136/89  12/28/23 (!) 142/87   John [name] was seen this afternoon for a routine follow-up visit. He has a known history of hypertension, which appears to remain uncontrolled despite current pharmacologic treatment. Given his comorbidities, I am concerned that persistently elevated blood pressure may further increase his risk for cardiovascular events. Today, his in-office blood pressure measured 154/100 mmHg, despite adherence to both amlodipine  10mg  and lisinopril  20 mg. Given this, I believe it is appropriate to initiate a third antihypertensive agent. I will switch his lisinopril  to Hyzaar to incorporate the additional benefit of hydrochlorothiazide . A basic metabolic panel will be obtained today, and he will return next week for repeat labs to monitor for any electrolyte abnormalities following initiation of the new medication.  I also think his obstructive sleep apnea could be contributing to his persistent hypertension.  Discussed benefits of  continuing CPAP use and weight loss. - Discontinue lisinopril  - Start Hyzaar 50-12.5 mg daily - Encourage ambulatory blood pressure check - Repeat BMP 7/7     Patient discussed with Dr. Lovie Drue Grow, M.D [facility] Phone: [phone] Date 01/10/2024 Time 3:11 PM

## 2024-01-10 NOTE — Progress Notes (Signed)
 Internal Medicine Clinic Attending  Case discussed with the resident at the time of the visit.  We reviewed the resident's history and exam and pertinent patient test results.  I agree with the assessment, diagnosis, and plan of care documented in the resident's note.

## 2024-01-10 NOTE — Assessment & Plan Note (Addendum)
 BP Readings from Last 3 Encounters:  01/10/24 (!) 154/100  12/28/23 136/89  12/28/23 (!) 142/87   John [name] was seen this afternoon for a routine follow-up visit. He has a known history of hypertension, which appears to remain uncontrolled despite current pharmacologic treatment. Given his comorbidities, I am concerned that persistently elevated blood pressure may further increase his risk for cardiovascular events. Today, his in-office blood pressure measured 154/100 mmHg, despite adherence to both amlodipine  10mg  and lisinopril  20 mg. Given this, I believe it is appropriate to initiate a third antihypertensive agent. I will switch his lisinopril  to Hyzaar to incorporate the additional benefit of hydrochlorothiazide . A basic metabolic panel will be obtained today, and he will return next week for repeat labs to monitor for any electrolyte abnormalities following initiation of the new medication.  I also think his obstructive sleep apnea could be contributing to his persistent hypertension.  Discussed benefits of continuing CPAP use and weight loss. - Discontinue lisinopril  - Start Hyzaar 50-12.5 mg daily - Encourage ambulatory blood pressure check - Repeat BMP 7/7

## 2024-01-10 NOTE — Patient Instructions (Addendum)
 Thank you, [name] for allowing us  to provide your care today. Today we discussed your blood sugar and your blood pressure.  Because your blood pressure has remained elevated during multiple visits, I am changing your blood pressure medication today.  Please do not take your lisinopril  when you go home.  You will now be managed with amlodipine  10 mg and a new medication called losartan -hydrochlorothiazide  (HYZAAR) 50-12.5 MG tablet daily.  I will see you back in the office next week to recheck your labs since I am starting you on this new medication.  I have ordered the following labs for you:  Lab Orders         Basic metabolic panel with GFR         Basic metabolic panel with GFR       Tests ordered today:    Referrals ordered today:   Referral Orders  No referral(s) requested today     I have ordered the following medication/changed the following medications:   Stop the following medications: Medications Discontinued During This Encounter  Medication Reason   traMADol (ULTRAM) 50 MG tablet Patient has not taken in last 30 days   predniSONE  (DELTASONE ) 10 MG tablet    naproxen  (NAPROSYN ) 500 MG tablet    lisinopril  (ZESTRIL ) 20 MG tablet      Start the following medications: Meds ordered this encounter  Medications   losartan -hydrochlorothiazide  (HYZAAR) 50-12.5 MG tablet    Sig: Take 1 tablet by mouth daily.    Dispense:  30 tablet    Refill:  11     Follow up: ONE WEEK    Remember: Return next week for a lab only visit for BMP  Should you have any questions or concerns please call the internal medicine clinic at [phone].

## 2024-01-10 NOTE — Assessment & Plan Note (Addendum)
 Lab Results  Component Value Date   HGBA1C 6.9 (A) 12/23/2023   HGBA1C 6.2 (A) 08/26/2023   HGBA1C 8.1 (A) 01/11/2023  [name] has a history of diabetes. Fortunately, his hemoglobin A1c was at goal (6.9) as of two weeks ago. He has intentionally tapered off his antidiabetic medications, reducing his Ozempic  dose from 2 mg to 1 mg, with the goal of discontinuing it entirely in the future if A1c remains at goal. He is currently taking Jardiance  10 mg daily. A microalbumin test will be done today. If results indicate proteinuria, consider increasing the Jardiance  dosage at that time. -  Urine micro albumin - Continue Ozempic  1 mg weekly - Continue Jardiance  10 mg

## 2024-01-11 ENCOUNTER — Telehealth: Payer: Self-pay | Admitting: *Deleted

## 2024-01-11 LAB — BASIC METABOLIC PANEL WITH GFR
BUN/Creatinine Ratio: 12 (ref 9–20)
BUN: 11 mg/dL (ref 6–24)
CO2: 18 mmol/L — ABNORMAL LOW (ref 20–29)
Calcium: 9.6 mg/dL (ref 8.7–10.2)
Chloride: 99 mmol/L (ref 96–106)
Creatinine, Ser: 0.9 mg/dL (ref 0.76–1.27)
Glucose: 141 mg/dL — ABNORMAL HIGH (ref 70–99)
Potassium: 4 mmol/L (ref 3.5–5.2)
Sodium: 135 mmol/L (ref 134–144)
eGFR: 100 mL/min/{1.73_m2} (ref >59)

## 2024-01-11 LAB — MICROALBUMIN / CREATININE URINE RATIO
Creatinine, Urine: 32.2 mg/dL
Microalb/Creat Ratio: 131 mg/g{creat} — ABNORMAL HIGH (ref 0–29)
Microalbumin, Urine: 42.1 ug/mL

## 2024-01-11 NOTE — Telephone Encounter (Signed)
 Copied from CRM [id number]. Topic: Clinical - Lab/Test Results >> Jan 11, 2024  1:01 PM Miquel SAILOR wrote: Reason for CRM: Basic metabolic panel with GFR-Patient calling on why CO2 low reading . Needs call back [phone]

## 2024-01-11 NOTE — Telephone Encounter (Signed)
 Copied from CRM [id number]. Topic: Clinical - Lab/Test Results >> Jan 11, 2024  8:43 AM Marda G wrote: Patient would like a call back today regarding his results ( not reviewed yet)   Please advise

## 2024-01-12 ENCOUNTER — Telehealth: Admitting: Physician Assistant

## 2024-01-12 ENCOUNTER — Telehealth: Payer: Self-pay | Admitting: *Deleted

## 2024-01-12 ENCOUNTER — Ambulatory Visit: Payer: Self-pay | Admitting: Student

## 2024-01-12 DIAGNOSIS — M5412 Radiculopathy, cervical region: Secondary | ICD-10-CM | POA: Diagnosis not present

## 2024-01-12 DIAGNOSIS — R3989 Other symptoms and signs involving the genitourinary system: Secondary | ICD-10-CM

## 2024-01-12 NOTE — Telephone Encounter (Signed)
 Copied from CRM [id number]. Topic: Clinical - Lab/Test Results >> Jan 11, 2024  1:01 PM Miquel SAILOR wrote: Reason for CRM: Basic metabolic panel with GFR-Patient calling on why CO2 low reading . Needs call back [phone] >> Jan 12, 2024  9:50 AM Graeme ORN wrote: Patient called back. Request to speak to provider. Has additional questions about previous call. Would like a call back. Thank You

## 2024-01-12 NOTE — Progress Notes (Signed)
 Patient was contacted this morning to review abnormal lab results. His bicarbonate level was low at 18, and microalbumin was moderately elevated at 131. We discussed the critical role of medication adherence in slowing the progression of end-stage renal disease. The patient admitted to taking Jardiance  only about once per week, rather than as prescribed. Given this non-compliance, I have decided not to increase the Jardiance  dosage at this time. We reviewed the benefits of Jardiance  in protecting kidney function, and the patient expressed understanding of the associated risks. He committed to improved adherence moving forward. The patient conveyed sincere appreciation for the call and counseling provided.  Plan:  - Continue Jardiance  10 mg daily  - Continue Amlodipine  10 mg Daily  - Repeat Micro/albumin urine in 3 months

## 2024-01-12 NOTE — Progress Notes (Signed)
 E-Visit for Urinary Problems  Based on what you shared with me, I feel your condition warrants further evaluation and I recommend that you be seen for a face to face office visit.  Male bladder infections are not very common.  We worry about prostate or kidney conditions.  The standard of care is to examine the abdomen and kidneys, and to do a urine and blood test to make sure that something more serious is not going on.  We recommend that you see a provider today.  If your doctor's office is closed [facility] has the following Urgent Cares:   NOTE: There will be NO CHARGE for this E-Visit   If you are having a true medical emergency, please call 911.     For an urgent face to face visit, [facility] has multiple urgent care centers for your convenience.  Click the link below for the full list of locations and hours, walk-in wait times, appointment scheduling options and driving directions:  Urgent Care - [place], [place], [place], [place], [place], [place]       Your MyChart E-visit questionnaire answers were reviewed by a board certified advanced clinical practitioner to complete your personal care plan based on your specific symptoms.  Thank you for using e-Visits.

## 2024-01-17 ENCOUNTER — Other Ambulatory Visit

## 2024-01-18 ENCOUNTER — Other Ambulatory Visit

## 2024-01-18 DIAGNOSIS — I10 Essential (primary) hypertension: Secondary | ICD-10-CM

## 2024-01-19 ENCOUNTER — Telehealth: Payer: Self-pay | Admitting: *Deleted

## 2024-01-19 LAB — BASIC METABOLIC PANEL WITH GFR
BUN/Creatinine Ratio: 16 (ref 9–20)
BUN: 19 mg/dL (ref 6–24)
CO2: 20 mmol/L (ref 20–29)
Calcium: 9.3 mg/dL (ref 8.7–10.2)
Chloride: 98 mmol/L (ref 96–106)
Creatinine, Ser: 1.19 mg/dL (ref 0.76–1.27)
Glucose: 157 mg/dL — ABNORMAL HIGH (ref 70–99)
Potassium: 4.2 mmol/L (ref 3.5–5.2)
Sodium: 135 mmol/L (ref 134–144)
eGFR: 72 mL/min/1.73 (ref >59)

## 2024-01-19 NOTE — Telephone Encounter (Signed)
 Copied from CRM [id number]. Topic: Clinical - Lab/Test Results >> Jan 19, 2024  7:57 AM Cherylann RAMAN wrote: Reason for CRM: Patient states that his EGFR went from 100 to 72 after taking new medication of losartan -hydrochlorothiazide  (HYZAAR) 50-12.5 MG tablet. Patient would like a call from a nurse or his provider to explain what is going on and why did it drop. Please contact patient at [phone].

## 2024-01-19 NOTE — Telephone Encounter (Signed)
 Pt called and informed of Dr Jac response; pt very appreciative.

## 2024-01-25 ENCOUNTER — Ambulatory Visit: Admitting: Physical Medicine and Rehabilitation

## 2024-01-25 DIAGNOSIS — M5412 Radiculopathy, cervical region: Secondary | ICD-10-CM | POA: Diagnosis not present

## 2024-01-26 ENCOUNTER — Telehealth: Admitting: Physician Assistant

## 2024-01-26 DIAGNOSIS — J019 Acute sinusitis, unspecified: Secondary | ICD-10-CM

## 2024-01-26 DIAGNOSIS — B9689 Other specified bacterial agents as the cause of diseases classified elsewhere: Secondary | ICD-10-CM

## 2024-01-26 MED ORDER — AMOXICILLIN-POT CLAVULANATE 875-125 MG PO TABS: 1.0000 | ORAL_TABLET | Freq: Two times a day (BID) | ORAL | 0 refills | Status: DC

## 2024-01-26 NOTE — Progress Notes (Signed)

## 2024-01-26 NOTE — Progress Notes (Signed)
 I have spent 5 minutes in review of e-visit questionnaire, review and updating patient chart, medical decision making and response to patient.   Piedad Climes, PA-C

## 2024-01-27 DIAGNOSIS — G4733 Obstructive sleep apnea (adult) (pediatric): Secondary | ICD-10-CM | POA: Diagnosis not present

## 2024-01-28 DIAGNOSIS — G4733 Obstructive sleep apnea (adult) (pediatric): Secondary | ICD-10-CM | POA: Diagnosis not present

## 2024-02-02 ENCOUNTER — Other Ambulatory Visit: Payer: Self-pay | Admitting: Student

## 2024-02-02 NOTE — Telephone Encounter (Signed)
 Medication sent to pharmacy

## 2024-02-03 ENCOUNTER — Ambulatory Visit (INDEPENDENT_AMBULATORY_CARE_PROVIDER_SITE_OTHER)

## 2024-02-03 ENCOUNTER — Encounter (HOSPITAL_COMMUNITY): Payer: Self-pay

## 2024-02-03 ENCOUNTER — Ambulatory Visit (HOSPITAL_COMMUNITY)
Admission: EM | Admit: 2024-02-03 | Discharge: 2024-02-03 | Disposition: A | Discharge status: Home / Self Care | Attending: Emergency Medicine | Admitting: Emergency Medicine

## 2024-02-03 DIAGNOSIS — R059 Cough, unspecified: Secondary | ICD-10-CM | POA: Diagnosis not present

## 2024-02-03 DIAGNOSIS — R051 Acute cough: Secondary | ICD-10-CM

## 2024-02-03 DIAGNOSIS — R918 Other nonspecific abnormal finding of lung field: Secondary | ICD-10-CM | POA: Diagnosis not present

## 2024-02-03 DIAGNOSIS — J019 Acute sinusitis, unspecified: Secondary | ICD-10-CM | POA: Diagnosis not present

## 2024-02-03 DIAGNOSIS — B9689 Other specified bacterial agents as the cause of diseases classified elsewhere: Secondary | ICD-10-CM | POA: Diagnosis not present

## 2024-02-03 LAB — POC SARS CORONAVIRUS 2 AG -  ED: SARS Coronavirus 2 Ag: NEGATIVE

## 2024-02-03 MED ORDER — AZELASTINE HCL 0.1 % NA SOLN
2.0000 | Freq: Two times a day (BID) | NASAL | 0 refills | Status: DC
Start: 2024-02-03 — End: 2024-03-23

## 2024-02-03 MED ORDER — ONDANSETRON 4 MG PO TBDP
ORAL_TABLET | ORAL | Status: AC
  Filled 2024-02-03: qty 1

## 2024-02-03 MED ORDER — IPRATROPIUM-ALBUTEROL 0.5-2.5 (3) MG/3ML IN SOLN
3.0000 mL | Freq: Once | RESPIRATORY_TRACT | Status: AC
  Administered 2024-02-03: 3 mL via RESPIRATORY_TRACT

## 2024-02-03 MED ORDER — ONDANSETRON 4 MG PO TBDP
4.0000 mg | ORAL_TABLET | Freq: Once | ORAL | Status: AC
  Administered 2024-02-03: 4 mg via ORAL

## 2024-02-03 MED ORDER — IPRATROPIUM-ALBUTEROL 0.5-2.5 (3) MG/3ML IN SOLN
RESPIRATORY_TRACT | Status: AC
  Filled 2024-02-03: qty 3

## 2024-02-03 NOTE — ED Triage Notes (Signed)
 Pt c/o cough, post nasal drip, diarrhea, and fatigue x4-5 days. States took tylenol  and zyrtec  with no relief.

## 2024-02-03 NOTE — Discharge Instructions (Signed)
 I recommend continuing with the Augmentin  that was previously prescribed for bacterial sinusitis. Use azelastine  nasal spray twice daily to help with congestion. You can take over-the-counter Coricidin for cough and congestion.  This medication is safe with your history of high blood pressure. Follow-up with your primary care provider or return here if your symptoms persist. If you develop shortness of breath, trouble breathing, chest pain, fevers unrelieved by medication, severe weakness, or passing out please seek immediate medical treatment in the emergency department.

## 2024-02-03 NOTE — ED Provider Notes (Addendum)
 [facility]    CSN: [id number] Arrival date & time: 02/03/24  1019      History   Chief Complaint Chief Complaint  Patient presents with   Cough    HPI John [name] is a 58 y.o. male.   Patient presents with productive cough, postnasal drip, sore throat, nasal congestion, and fatigue x 7 to 8 days.  Patient states that he did start having some diarrhea and had 2 episodes of vomiting this morning as well.  Patient does endorse some nausea at this time.  Patient also had a virtual visit on 7/16 and he was prescribed Augmentin  at that time for bacterial sinusitis.  Patient states that he did start taking this but did not finish it because he felt it was making him more tired.  Patient denies any fever, but states that a few nights ago he did take some Tylenol  and woke up sweating. Patient states that he did try taking Zyrtec  daily with minimal relief of congestion and postnasal drip.  Denies chest pain, shortness of breath, abdominal pain.  Patient denies any known sick contacts.  Patient denies history of asthma or COPD.  Past medical history includes type 2 diabetes, hypertension, and hyperlipidemia.  The history is provided by the patient and medical records.  Cough   Past Medical History:  Diagnosis Date   Anxiety disorder    Erectile dysfunction    Hyperlipidemia    Hypertension    Obesity, morbid, BMI 50 or higher (HCC)    Poorly controlled type 2 diabetes mellitus (HCC)    Schizophrenia (HCC)    Sleep apnea     Patient Active Problem List   Diagnosis Date Noted   Shoulder pain, bilateral 01/11/2023   BPPV (benign paroxysmal positional vertigo) 01/11/2023   Left cervical radiculopathy 09/29/2022   Healthcare maintenance 12/03/2021   Erectile dysfunction 02/28/2020   Hyperlipidemia associated with type 2 diabetes mellitus (HCC) 10/18/2019   Type 2 diabetes mellitus with unspecified complications (HCC) 07/26/2019   Schizophrenia (HCC) 07/25/2019    Anxiety disorder 07/25/2019   OSA (obstructive sleep apnea) 08/17/2013   Morbid obesity (HCC) 08/17/2013   Hypertension 08/17/2013    Past Surgical History:  Procedure Laterality Date   ANKLE SURGERY     APPENDECTOMY         Home Medications    Prior to Admission medications   Medication Sig Start Date End Date Taking? Authorizing Provider  azelastine  (ASTELIN ) 0.1 % nasal spray Place 2 sprays into both nostrils 2 (two) times daily. Use in each nostril as directed 02/03/24  Yes Johnie Flaming A, NP  Accu-Chek Softclix Lancets lancets USE CHECK GLUCOSE ONCE TO TWICE DAILY 08/26/23   Alexander-Savino, [place], MD  amLODipine  (NORVASC ) 10 MG tablet Take 1 tablet (10 mg total) by mouth at bedtime. 08/26/23   Alexander-Savino, [place], MD  amoxicillin -clavulanate (AUGMENTIN ) 875-125 MG tablet Take 1 tablet by mouth 2 (two) times daily. 01/26/24   Gladis Elsie BROCKS, PA-C  baclofen  (LIORESAL ) 10 MG tablet Take 1 tablet (10 mg total) by mouth 2 (two) times daily as needed for muscle spasms. 12/11/23   Raspet, Erin K, PA-C  BD PEN NEEDLE NANO 2ND GEN 32G X 4 MM MISC USE NEEDLE WITH INSULIN  PEN TO INJECT YOUR INSULIN  AS DIRECTED 08/26/23   Alexander-Savino, [place], MD  Blood Glucose Monitoring Suppl (ACCU-CHEK GUIDE) w/Device KIT Use to check blood sugars 1-2 times daily. 03/26/23   Fernand Prost, MD  cetirizine  (ZYRTEC ) 10 MG tablet Take  1 tablet by mouth daily.    [provider]  diclofenac  Sodium (VOLTAREN  ARTHRITIS PAIN) 1 % GEL Apply 4 g topically 4 (four) times daily. 08/26/23   Alexander-Savino, [place], MD  Elastic Bandages & Supports (WRIST SPLINT/ELASTIC RIGHT XL) MISC 1 Application by Does not apply route daily. 08/26/23   Alexander-Savino, [place], MD  fluticasone  (FLONASE ) 50 MCG/ACT nasal spray Place 2 sprays into both nostrils daily. 12/12/23   Lavell Bari LABOR, FNP  gabapentin  (NEURONTIN ) 100 MG capsule Take 1 capsule (100 mg total) by mouth 3 (three) times daily. 09/01/23  11/30/23  Elnora Ip, MD  glucose blood (ACCU-CHEK GUIDE TEST) test strip Use to check blood sugar once to twice daily 12/23/23   Gregary Sharper, MD  JARDIANCE  10 MG TABS tablet TAKE 1 TABLET BY MOUTH ONCE DAILY BEFORE BREAKFAST 02/02/24   Tawkaliyar, Roya, DO  LANTUS  SOLOSTAR 100 UNIT/ML Solostar Pen Inject 25 Units into the skin at bedtime. 01/04/24   Jolaine Pac, DO  lidocaine  (LIDODERM ) 5 % Place 1 patch onto the skin daily. Remove & Discard patch within 12 hours or as directed by MD 12/11/23   Raspet, Rocky K, PA-C  losartan -hydrochlorothiazide  (HYZAAR) 50-12.5 MG tablet Take 1 tablet by mouth daily. 01/10/24 01/09/25  Amoako, Prince, MD  meclizine  (ANTIVERT ) 25 MG tablet Take 1 tablet (25 mg total) by mouth 3 (three) times daily as needed for dizziness. 12/24/23   Vivienne Delon HERO, PA-C  omeprazole  (PRILOSEC) 20 MG capsule Take 1 capsule (20 mg total) by mouth daily before breakfast. 10/30/23   Fleming, Zelda W, NP  ondansetron  (ZOFRAN -ODT) 4 MG disintegrating tablet Take 1 tablet (4 mg total) by mouth every 8 (eight) hours as needed for nausea or vomiting. 12/28/23   Reddick, Johnathan B, NP  rosuvastatin  (CRESTOR ) 20 MG tablet Take 1 tablet by mouth once daily 11/24/23   Fernand Prost, MD  Semaglutide , 1 MG/DOSE, 4 MG/3ML SOPN Inject 1 mg into the skin once a week. 12/23/23   Gregary Sharper, MD    Family History Family History  Problem Relation Age of Onset   Breast cancer Mother    Alcohol abuse Father    Hypertension Sister    Breast cancer Sister    Hypertension Brother    Diabetes type II Brother    Colon cancer Paternal Uncle        possible but not sure   Colon polyps Neg Hx    Rectal cancer Neg Hx    Stomach cancer Neg Hx     Social History Social History   Tobacco Use   Smoking status: Never   Smokeless tobacco: Never  Vaping Use   Vaping status: Never Used  Substance Use Topics   Alcohol use: Yes    Comment: rare in the last two years, 1x in 2-3 months    Drug use: Yes    Types: Marijuana    Comment: Occasional     Allergies   Patient has no known allergies.   Review of Systems Review of Systems  Respiratory:  Positive for cough.    Per HPI  Physical Exam Triage Vital Signs ED Triage Vitals  Encounter Vitals Group     BP 02/03/24 1058 (!) 152/89     Girls Systolic BP Percentile --      Girls Diastolic BP Percentile --      Boys Systolic BP Percentile --      Boys Diastolic BP Percentile --      Pulse Rate 02/03/24 1058 (!)  101     Resp 02/03/24 1058 18     Temp 02/03/24 1058 98.2 F (36.8 C)     Temp Source 02/03/24 1058 Oral     SpO2 02/03/24 1058 96 %     Weight --      Height --      Head Circumference --      Peak Flow --      Pain Score 02/03/24 1100 0     Pain Loc --      Pain Education --      Exclude from Growth Chart --    No data found.  Updated Vital Signs BP (!) 152/89   Pulse (!) 101   Temp 98.2 F (36.8 C) (Oral)   Resp 18   SpO2 96%   Visual Acuity Right Eye Distance:   Left Eye Distance:   Bilateral Distance:    Right Eye Near:   Left Eye Near:    Bilateral Near:     Physical Exam Vitals and nursing note reviewed.  Constitutional:      General: He is awake. He is not in acute distress.    Appearance: Normal appearance. He is well-developed and well-groomed. He is not ill-appearing.  HENT:     Right Ear: Tympanic membrane, ear canal and external ear normal.     Left Ear: Tympanic membrane, ear canal and external ear normal.     Nose: Congestion and rhinorrhea present.     Mouth/Throat:     Mouth: Mucous membranes are moist.     Pharynx: Posterior oropharyngeal erythema and postnasal drip present. No oropharyngeal exudate.  Cardiovascular:     Rate and Rhythm: Normal rate and regular rhythm.  Pulmonary:     Effort: Pulmonary effort is normal.     Breath sounds: Examination of the right-lower field reveals decreased breath sounds. Examination of the left-lower field reveals  decreased breath sounds. Decreased breath sounds present.  Abdominal:     General: Abdomen is protuberant. Bowel sounds are normal.     Palpations: Abdomen is soft.     Tenderness: There is no abdominal tenderness. There is no guarding or rebound.  Musculoskeletal:        General: Normal range of motion.  Skin:    General: Skin is warm and dry.  Neurological:     General: No focal deficit present.     Mental Status: He is alert and oriented to person, place, and time. Mental status is at baseline.  Psychiatric:        Behavior: Behavior is cooperative.      UC Treatments / Results  Labs (all labs ordered are listed, but only abnormal results are displayed) Labs Reviewed  POC SARS CORONAVIRUS 2 AG -  ED    EKG   Radiology DG Chest 2 View Result Date: 02/03/2024 CLINICAL DATA:  Cough EXAM: CHEST - 2 VIEW COMPARISON:  11/20/2003 FINDINGS: Moderately elevated right hemidiaphragm with scarring or atelectasis along the right lower lobe and right middle lobe. The lungs appear otherwise clear.  Heart size within normal limits. Mild lower thoracic spondylosis. No blunting of the costophrenic angles. IMPRESSION: 1. Moderately elevated right hemidiaphragm with scarring or atelectasis along the right lower lobe and right middle lobe. 2. Mild lower thoracic spondylosis. Electronically Signed   By: Ryan Salvage M.D.   On: 02/03/2024 12:33    Procedures Procedures (including critical care time)  Medications Ordered in UC Medications  ipratropium-albuterol  (DUONEB) 0.5-2.5 (3) MG/3ML nebulizer solution  3 mL (3 mLs Nebulization Given 02/03/24 1145)  ondansetron  (ZOFRAN -ODT) disintegrating tablet 4 mg (4 mg Oral Given 02/03/24 1149)    Initial Impression / Assessment and Plan / UC Course  I have reviewed the triage vital signs and the nursing notes.  Pertinent labs & imaging results that were available during my care of the patient were reviewed by me and considered in my medical  decision making (see chart for details).     Patient is overall well-appearing.  Vitals are stable.  Upon exam congestion and rhinorrhea are present, mild erythema and PND noted to pharynx.  Mildly diminished breath sounds noted to right and left lower lobes.  Chest x-ray ordered.  Based on my interpretation there is no evidence of pneumonia at this time.  Radiology report reveals moderately elevated right hemidiaphragm with scarring or atelectasis along the right lower lobe and right middle lobe.  DuoNeb given but lung sounds remain unchanged.  Diminished lung sounds likely related to x-ray findings.  ODT Zofran  given in clinic for acute nausea and patient reports relief with this.  Patient states that he does have some leftover Zofran  at home and he will take this if needed.  Recommended initiate previously prescribed Augmentin  for bacterial sinusitis.  Prescribed azelastine  nasal spray for additional relief of this.  Recommended Coricidin for cough and congestion as well.  Discussed follow-up, return, and strict ER precautions. Final Clinical Impressions(s) / UC Diagnoses   Final diagnoses:  Acute cough  Acute bacterial sinusitis     Discharge Instructions      I recommend continuing with the Augmentin  that was previously prescribed for bacterial sinusitis. Use azelastine  nasal spray twice daily to help with congestion. You can take over-the-counter Coricidin for cough and congestion.  This medication is safe with your history of high blood pressure. Follow-up with your primary care provider or return here if your symptoms persist. If you develop shortness of breath, trouble breathing, chest pain, fevers unrelieved by medication, severe weakness, or passing out please seek immediate medical treatment in the emergency department.     ED Prescriptions     Medication Sig Dispense Auth. Provider   azelastine  (ASTELIN ) 0.1 % nasal spray Place 2 sprays into both nostrils 2 (two) times  daily. Use in each nostril as directed 30 mL Johnie Flaming A, NP      PDMP not reviewed this encounter.   Johnie Flaming LABOR, NP 02/03/24 1252    Johnie Flaming A, NP 02/03/24 1253

## 2024-02-07 ENCOUNTER — Ambulatory Visit: Admitting: Student

## 2024-02-07 ENCOUNTER — Ambulatory Visit: Payer: Self-pay

## 2024-02-07 VITALS — BP 148/62 | HR 107 | Temp 98.1°F | Ht 67.0 in | Wt 327.6 lb

## 2024-02-07 DIAGNOSIS — R42 Dizziness and giddiness: Secondary | ICD-10-CM

## 2024-02-07 DIAGNOSIS — H8112 Benign paroxysmal vertigo, left ear: Secondary | ICD-10-CM

## 2024-02-07 MED ORDER — MECLIZINE HCL 25 MG PO TABS: 25.0000 mg | ORAL_TABLET | Freq: Three times a day (TID) | ORAL | 0 refills | Status: AC | PRN

## 2024-02-07 NOTE — Telephone Encounter (Signed)
 Pt has an app today @ 1515 PM with Dr Marylu.

## 2024-02-07 NOTE — Telephone Encounter (Signed)
 FYI Only or Action Required?: FYI only for provider.  Patient was last seen in primary care on 01/10/2024 by Renne Homans, MD.  Called Nurse Triage reporting Medication Problem.  Symptoms began today.  Interventions attempted: Nothing.  Symptoms are: unchanged.Pt. Already has appointment for today. Took elderberry probiotic and started feeling dizzy and having nausea.  Triage Disposition: Call PCP Now  Patient/caregiver understands and will follow disposition?: Yes    Reason for Disposition  [1] Caller has URGENT medicine question about med that primary care doctor (or NP/PA) or specialist prescribed AND [2] triager unable to answer question  Answer Assessment - Initial Assessment Questions 1. NAME of MEDICINE: What medicine(s) are you calling about?     Elderberry probiotic 2. QUESTION: What is your question? (e.g., double dose of medicine, side effect)     Side effect 3. PRESCRIBER: Who prescribed the medicine? Reason: if prescribed by specialist, call should be referred to that group.     N/a 4. SYMPTOMS: Do you have any symptoms? If Yes, ask: What symptoms are you having?  How bad are the symptoms (e.g., mild, moderate, severe)     dizzy 5. PREGNANCY:  Is there any chance that you are pregnant? When was your last menstrual period?     Nb/a  Protocols used: Medication Question Call-A-AH

## 2024-02-07 NOTE — Telephone Encounter (Signed)
  first attempt, LVM for patient to return call to [phone]  Message from Cunningham H sent at 02/07/2024  1:08 PM EDT  Patient took a elderberry with probiotic about a hour ago and he started feeling dizzy and light headed, no headache, feels like he was to vomit. Patients callback number is [phone].

## 2024-02-07 NOTE — Telephone Encounter (Signed)
 FYI Only or Action Required?: FYI only for provider.  Patient was last seen in primary care on 01/10/2024 by Renne Homans, MD.  Called Nurse Triage reporting Dizziness.  Symptoms began today.  Interventions attempted: OTC medications: elderberry and probiotic.  Symptoms are: unchanged.  Triage Disposition: See Physician Within 24 Hours  Patient/caregiver understands and will follow disposition?: Yes   Apt today  Copied from CRM #[id number]. Topic: Clinical - Pink Word Triage >> Feb 07, 2024  1:06 PM Shamecia H wrote: Reason for Triage: Patient took a elderberry with probiotic about a hour ago and he started feeling dizzy and light headed, no headache, feels like he was to vomit. Patients callback number is [phone]. >> Feb 07, 2024  1:08 PM Shamecia H wrote: Patient took a elderberry with probiotic about a hour ago and he started feeling dizzy and light headed, no headache, feels like he was to vomit. Patients callback number is [phone]. Reason for Disposition  [1] MODERATE dizziness (e.g., interferes with normal activities) AND [2] has NOT been evaluated by doctor (or NP/PA) for this  (Exception: Dizziness caused by heat exposure, sudden standing, or poor fluid intake.)  Answer Assessment - Initial Assessment Questions 1. DESCRIPTION: Describe your dizziness.     Tilting room 2. LIGHTHEADED: Do you feel lightheaded? (e.g., somewhat faint, woozy, weak upon standing)     woozy 3. VERTIGO: Do you feel like either you or the room is spinning or tilting? (i.e., vertigo)     Tilting, hx vertigo 4. SEVERITY: How bad is it?  Do you feel like you are going to faint? Can you stand and walk?     mild 5. ONSET:  When did the dizziness begin?     Today after elderberry and probiotic 6. AGGRAVATING FACTORS: Does anything make it worse? (e.g., standing, change in head position)     Standing up 7. HEART RATE: Can you tell me your heart rate? How many beats in 15  seconds?  (Note: Not all patients can do this.)       no 8. CAUSE: What do you think is causing the dizziness? (e.g., decreased fluids or food, diarrhea, emotional distress, heat exposure, new medicine, sudden standing, vomiting; unknown)     unknown 9. RECURRENT SYMPTOM: Have you had dizziness before? If Yes, ask: When was the last time? What happened that time?     Yes, hx vertigo 10. OTHER SYMPTOMS: Do you have any other symptoms? (e.g., fever, chest pain, vomiting, diarrhea, bleeding)       no 11. PREGNANCY: Is there any chance you are pregnant? When was your last menstrual period?       na  Protocols used: Dizziness - Lightheadedness-A-AH

## 2024-02-07 NOTE — Progress Notes (Unsigned)
 CC: Follow-up  HPI:  [name] is a 58 y.o. male living with a history stated below and presents today for follow-up. Please see problem based assessment and plan for additional details.  Past Medical History:  Diagnosis Date   Anxiety disorder    Erectile dysfunction    Hyperlipidemia    Hypertension    Obesity, morbid, BMI 50 or higher (HCC)    Poorly controlled type 2 diabetes mellitus (HCC)    Schizophrenia (HCC)    Sleep apnea     Current Outpatient Medications on File Prior to Visit  Medication Sig Dispense Refill   Accu-Chek Softclix Lancets lancets USE CHECK GLUCOSE ONCE TO TWICE DAILY 100 each 3   amLODipine  (NORVASC ) 10 MG tablet Take 1 tablet (10 mg total) by mouth at bedtime. 90 tablet 3   amoxicillin -clavulanate (AUGMENTIN ) 875-125 MG tablet Take 1 tablet by mouth 2 (two) times daily. 14 tablet 0   azelastine  (ASTELIN ) 0.1 % nasal spray Place 2 sprays into both nostrils 2 (two) times daily. Use in each nostril as directed 30 mL 0   baclofen  (LIORESAL ) 10 MG tablet Take 1 tablet (10 mg total) by mouth 2 (two) times daily as needed for muscle spasms. 20 each 0   BD PEN NEEDLE NANO 2ND GEN 32G X 4 MM MISC USE NEEDLE WITH INSULIN  PEN TO INJECT YOUR INSULIN  AS DIRECTED 100 each 3   Blood Glucose Monitoring Suppl (ACCU-CHEK GUIDE) w/Device KIT Use to check blood sugars 1-2 times daily. 1 kit 0   cetirizine  (ZYRTEC ) 10 MG tablet Take 1 tablet by mouth daily.     diclofenac  Sodium (VOLTAREN  ARTHRITIS PAIN) 1 % GEL Apply 4 g topically 4 (four) times daily. 4 g 0   Elastic Bandages & Supports (WRIST SPLINT/ELASTIC RIGHT XL) MISC 1 Application by Does not apply route daily. 1 each 0   fluticasone  (FLONASE ) 50 MCG/ACT nasal spray Place 2 sprays into both nostrils daily. 16 g 6   gabapentin  (NEURONTIN ) 100 MG capsule Take 1 capsule (100 mg total) by mouth 3 (three) times daily. 90 capsule 1   glucose blood (ACCU-CHEK GUIDE TEST) test strip Use to check blood sugar once to  twice daily 100 each 3   JARDIANCE  10 MG TABS tablet TAKE 1 TABLET BY MOUTH ONCE DAILY BEFORE BREAKFAST 30 tablet 0   LANTUS  SOLOSTAR 100 UNIT/ML Solostar Pen Inject 25 Units into the skin at bedtime. 30 mL 1   lidocaine  (LIDODERM ) 5 % Place 1 patch onto the skin daily. Remove & Discard patch within 12 hours or as directed by MD 30 patch 0   losartan -hydrochlorothiazide  (HYZAAR) 50-12.5 MG tablet Take 1 tablet by mouth daily. 30 tablet 11   meclizine  (ANTIVERT ) 25 MG tablet Take 1 tablet (25 mg total) by mouth 3 (three) times daily as needed for dizziness. 30 tablet 0   omeprazole  (PRILOSEC) 20 MG capsule Take 1 capsule (20 mg total) by mouth daily before breakfast. 30 capsule 0   ondansetron  (ZOFRAN -ODT) 4 MG disintegrating tablet Take 1 tablet (4 mg total) by mouth every 8 (eight) hours as needed for nausea or vomiting. 20 tablet 0   rosuvastatin  (CRESTOR ) 20 MG tablet Take 1 tablet by mouth once daily 90 tablet 0   Semaglutide , 1 MG/DOSE, 4 MG/3ML SOPN Inject 1 mg into the skin once a week. 3 mL 3   No current facility-administered medications on file prior to visit.    Family History  Problem Relation Age of Onset  Breast cancer Mother    Alcohol abuse Father    Hypertension Sister    Breast cancer Sister    Hypertension Brother    Diabetes type II Brother    Colon cancer Paternal Uncle        possible but not sure   Colon polyps Neg Hx    Rectal cancer Neg Hx    Stomach cancer Neg Hx     Social History   Socioeconomic History   Marital status: Widowed    Spouse name: Not on file   Number of children: Not on file   Years of education: Not on file   Highest education level: Not on file  Occupational History   Not on file  Tobacco Use   Smoking status: Never   Smokeless tobacco: Never  Vaping Use   Vaping status: Never Used  Substance and Sexual Activity   Alcohol use: Yes    Comment: rare in the last two years, 1x in 2-3 months   Drug use: Yes    Types: Marijuana     Comment: Occasional   Sexual activity: Yes  Other Topics Concern   Not on file  Social History Narrative   Not on file   Social Drivers of Health   Financial Resource Strain: Medium Risk (10/20/2023)   Overall Financial Resource Strain (CARDIA)    Difficulty of Paying Living Expenses: Somewhat hard  Food Insecurity: No Food Insecurity (10/20/2023)   Hunger Vital Sign    Worried About Running Out of Food in the Last Year: Never true    Ran Out of Food in the Last Year: Never true  Transportation Needs: Unmet Transportation Needs (10/20/2023)   PRAPARE - Transportation    Lack of Transportation (Medical): Yes    Lack of Transportation (Non-Medical): Yes  Physical Activity: Sufficiently Active (10/20/2023)   Exercise Vital Sign    Days of Exercise per Week: 7 days    Minutes of Exercise per Session: 30 min  Stress: Stress Concern Present (10/20/2023)   [employer] of Occupational Health - Occupational Stress Questionnaire    Feeling of Stress : Rather much  Social Connections: Moderately Isolated (10/20/2023)   Social Connection and Isolation Panel    Frequency of Communication with Friends and Family: More than three times a week    Frequency of Social Gatherings with Friends and Family: More than three times a week    Attends Religious Services: More than 4 times per year    Active Member of [employer] or Organizations: No    Attends Banker Meetings: Never    Marital Status: Widowed  Intimate Partner Violence: Not At Risk (10/20/2023)   Humiliation, Afraid, Rape, and Kick questionnaire    Fear of Current or Ex-Partner: No    Emotionally Abused: No    Physically Abused: No    Sexually Abused: No    Review of Systems: ROS negative except for what is noted on the assessment and plan.  Vitals:   02/07/24 1517  BP: (!) 148/62  Pulse: (!) 107  Temp: 98.1 F (36.7 C)  TempSrc: Oral  SpO2: 98%  Weight: (!) 327 lb 9.6 oz (148.6 kg)  Height: 5' 7 (1.702 m)     Physical Exam: Constitutional: well-appearing, sitting in chair, in no acute distress Cardiovascular: regular rate and rhythm, no m/r/g Pulmonary/Chest: normal work of breathing on room air, lungs clear to auscultation bilaterally Abdominal: soft, non-tender, non-distended MSK: normal bulk and tone Skin: warm and dry Psych:  normal mood and behavior  Assessment & Plan:     Patient discussed with Dr. {[id number]::Tpoopjfd,Z. Hoffman,Mullen,Narendra,Vincent,Guilloud,Lau,Machen}  No problem-specific Assessment & Plan notes found for this encounter.   Norman Lobstein, D.O. [facility] Internal Medicine, PGY-1 Phone: [phone] Date 02/07/2024 Time 3:59 PM

## 2024-02-08 DIAGNOSIS — R42 Dizziness and giddiness: Secondary | ICD-10-CM | POA: Insufficient documentation

## 2024-02-08 NOTE — Assessment & Plan Note (Signed)
 Has been seen for this a few times.  He describes a sense of sinus pressure associated with symptoms.  ENT referral previously sent with appointment scheduled for 9/11. He was given azelastine  nasal spray, meclizine , and Zofran  at recent UC visit for sinusitis.  Also seen on 6/17 at [facility] for vertigo-like symptoms.  He has not used nasal spray or meclizine .  Zofran  has helped with associated nausea.  He describes both dizziness and lightheadedness.  Orthostatics were negative.  He is tachycardic but rhythm is regular and states he is feeling a bit anxious.  EKG from 6/17 UC visit showed normal sinus rhythm with rate of 93.  I do wonder if there is a vestibular component given persistent sinus pressure though will defer vestibular rehab referral for now pending ENT evaluation.  I advised him to try meclizine /azelastine  to see if symptoms improve and to further elucidate potential etiology.  I also advised him to keep close track of potential exacerbating factors (e.g. medications) as symptoms are seemingly intermittent/sporadic. Further instructed to call if symptoms do not improve.

## 2024-02-09 NOTE — Progress Notes (Signed)
 Internal Medicine Clinic Attending  Case discussed with the resident at the time of the visit.  We reviewed the resident's history and exam and pertinent patient test results.  I agree with the assessment, diagnosis, and plan of care documented in the resident's note.

## 2024-02-12 ENCOUNTER — Encounter: Payer: Self-pay | Admitting: Student

## 2024-02-12 ENCOUNTER — Other Ambulatory Visit: Payer: Self-pay | Admitting: Student

## 2024-02-17 ENCOUNTER — Telehealth: Admitting: Physician Assistant

## 2024-02-17 DIAGNOSIS — R0981 Nasal congestion: Secondary | ICD-10-CM | POA: Diagnosis not present

## 2024-02-17 DIAGNOSIS — J349 Unspecified disorder of nose and nasal sinuses: Secondary | ICD-10-CM | POA: Diagnosis not present

## 2024-02-17 MED ORDER — LEVOCETIRIZINE DIHYDROCHLORIDE 5 MG PO TABS: 5.0000 mg | ORAL_TABLET | Freq: Every evening | ORAL | 0 refills | Status: DC

## 2024-02-17 NOTE — Progress Notes (Signed)
 Virtual Visit Consent   John [name], you are scheduled for a virtual visit with a [facility] provider today. Just as with appointments in the office, your consent must be obtained to participate. Your consent will be active for this visit and any virtual visit you may have with one of our providers in the next 365 days. If you have a MyChart account, a copy of this consent can be sent to you electronically.  As this is a virtual visit, video technology does not allow for your provider to perform a traditional examination. This may limit your provider's ability to fully assess your condition. If your provider identifies any concerns that need to be evaluated in person or the need to arrange testing (such as labs, EKG, etc.), we will make arrangements to do so. Although advances in technology are sophisticated, we cannot ensure that it will always work on either your end or our end. If the connection with a video visit is poor, the visit may have to be switched to a telephone visit. With either a video or telephone visit, we are not always able to ensure that we have a secure connection.  By engaging in this virtual visit, you consent to the provision of healthcare and authorize for your insurance to be billed (if applicable) for the services provided during this visit. Depending on your insurance coverage, you may receive a charge related to this service.  I need to obtain your verbal consent now. Are you willing to proceed with your visit today? John [name] has provided verbal consent on 02/17/2024 for a virtual visit (video or telephone). Teena Shuck, [place]  Date: 02/17/2024 3:18 PM   Virtual Visit via Video Note   I, Teena Shuck, connected with  John [name]  ([id number], 27-Apr-1966) on 02/17/24 at  3:15 PM EDT by a video-enabled telemedicine application and verified that I am speaking with the correct person using two identifiers.  Location: Patient: Virtual Visit Location Patient:  Home Provider: Virtual Visit Location Provider: Home Office   I discussed the limitations of evaluation and management by telemedicine and the availability of in person appointments. The patient expressed understanding and agreed to proceed.    History of Present Illness: John [name] is a 58 y.o. who identifies as a male who was assigned male at birth, and is being seen today for pressure on teeth and gums.  HPI: Oral Pain  This is a new problem. The current episode started in the past 7 days. The problem occurs constantly. The problem has been unchanged. The pain is mild. Associated symptoms include facial pain. Pertinent negatives include no difficulty swallowing. He has tried acetaminophen  and NSAIDs for the symptoms.    Problems:  Patient Active Problem List   Diagnosis Date Noted   Dizziness 02/08/2024   Shoulder pain, bilateral 01/11/2023   BPPV (benign paroxysmal positional vertigo) 01/11/2023   Left cervical radiculopathy 09/29/2022   Healthcare maintenance 12/03/2021   Erectile dysfunction 02/28/2020   Hyperlipidemia associated with type 2 diabetes mellitus (HCC) 10/18/2019   Type 2 diabetes mellitus with unspecified complications (HCC) 07/26/2019   Schizophrenia (HCC) 07/25/2019   Anxiety disorder 07/25/2019   OSA (obstructive sleep apnea) 08/17/2013   Morbid obesity (HCC) 08/17/2013   Hypertension 08/17/2013    Allergies: No Known Allergies Medications:  Current Outpatient Medications:    Accu-Chek Softclix Lancets lancets, USE CHECK GLUCOSE ONCE TO TWICE DAILY, Disp: 100 each, Rfl: 3   amLODipine  (NORVASC ) 10 MG tablet, Take  1 tablet (10 mg total) by mouth at bedtime., Disp: 90 tablet, Rfl: 3   amoxicillin -clavulanate (AUGMENTIN ) 875-125 MG tablet, Take 1 tablet by mouth 2 (two) times daily., Disp: 14 tablet, Rfl: 0   azelastine  (ASTELIN ) 0.1 % nasal spray, Place 2 sprays into both nostrils 2 (two) times daily. Use in each nostril as directed, Disp: 30 mL, Rfl: 0    baclofen  (LIORESAL ) 10 MG tablet, Take 1 tablet (10 mg total) by mouth 2 (two) times daily as needed for muscle spasms., Disp: 20 each, Rfl: 0   BD PEN NEEDLE NANO 2ND GEN 32G X 4 MM MISC, USE NEEDLE WITH INSULIN  PEN TO INJECT YOUR INSULIN  AS DIRECTED, Disp: 100 each, Rfl: 3   Blood Glucose Monitoring Suppl (ACCU-CHEK GUIDE) w/Device KIT, Use to check blood sugars 1-2 times daily., Disp: 1 kit, Rfl: 0   cetirizine  (ZYRTEC ) 10 MG tablet, Take 1 tablet by mouth daily., Disp: , Rfl:    diclofenac  Sodium (VOLTAREN  ARTHRITIS PAIN) 1 % GEL, Apply 4 g topically 4 (four) times daily., Disp: 4 g, Rfl: 0   Elastic Bandages & Supports (WRIST SPLINT/ELASTIC RIGHT XL) MISC, 1 Application by Does not apply route daily., Disp: 1 each, Rfl: 0   fluticasone  (FLONASE ) 50 MCG/ACT nasal spray, Place 2 sprays into both nostrils daily., Disp: 16 g, Rfl: 6   gabapentin  (NEURONTIN ) 100 MG capsule, Take 1 capsule (100 mg total) by mouth 3 (three) times daily., Disp: 90 capsule, Rfl: 1   glucose blood (ACCU-CHEK GUIDE TEST) test strip, Use to check blood sugar once to twice daily, Disp: 100 each, Rfl: 3   JARDIANCE  10 MG TABS tablet, TAKE 1 TABLET BY MOUTH ONCE DAILY BEFORE BREAKFAST, Disp: 30 tablet, Rfl: 0   LANTUS  SOLOSTAR 100 UNIT/ML Solostar Pen, Inject 25 Units into the skin at bedtime., Disp: 30 mL, Rfl: 1   lidocaine  (LIDODERM ) 5 %, Place 1 patch onto the skin daily. Remove & Discard patch within 12 hours or as directed by MD, Disp: 30 patch, Rfl: 0   losartan -hydrochlorothiazide  (HYZAAR) 50-12.5 MG tablet, Take 1 tablet by mouth daily., Disp: 30 tablet, Rfl: 11   meclizine  (ANTIVERT ) 25 MG tablet, Take 1 tablet (25 mg total) by mouth 3 (three) times daily as needed for dizziness., Disp: 30 tablet, Rfl: 0   omeprazole  (PRILOSEC) 20 MG capsule, Take 1 capsule (20 mg total) by mouth daily before breakfast., Disp: 30 capsule, Rfl: 0   ondansetron  (ZOFRAN -ODT) 4 MG disintegrating tablet, Take 1 tablet (4 mg total) by mouth  every 8 (eight) hours as needed for nausea or vomiting., Disp: 20 tablet, Rfl: 0   rosuvastatin  (CRESTOR ) 20 MG tablet, Take 1 tablet by mouth once daily, Disp: 90 tablet, Rfl: 0   Semaglutide , 1 MG/DOSE, 4 MG/3ML SOPN, Inject 1 mg into the skin once a week., Disp: 3 mL, Rfl: 3  Observations/Objective: Patient is well-developed, well-nourished in no acute distress.  Resting comfortably  at home.  Head is normocephalic, atraumatic.  No labored breathing.  Speech is clear and coherent with logical content.  Patient is alert and oriented at baseline.   Assessment and Plan: 1. Sinus problem (Primary)  2. Nasal congestion  Patient presenting with nasal congestion.. States zyrtec  is no longer working. Advised to trial xyzal  for the next 2 days unless his symptoms worsen.   Differentials include sinusitis (bacterial) bacterial pneumonia, sinusitis. Do not suspect underlying cardiopulmonary process. I considered, but think unlikely, dangerous causes of this patient's symptoms to include ACS,  CHF or pneumonia, pneumothorax. Patient is nontoxic and not in need of emergent medical intervention. Plan: reassurance, reassessment, over the counter medications, discharge with PCP follow-up  Follow Up Instructions: I discussed the assessment and treatment plan with the patient. The patient was provided an opportunity to ask questions and all were answered. The patient agreed with the plan and demonstrated an understanding of the instructions.  A copy of instructions were sent to the patient via MyChart unless otherwise noted below.    The patient was advised to call back or seek an in-person evaluation if the symptoms worsen or if the condition fails to improve as anticipated.    Teena Shuck, PA-C

## 2024-02-17 NOTE — Patient Instructions (Signed)
 John [name], thank you for joining Teena Shuck, PA-C for today's virtual visit.  While this provider is not your primary care provider (PCP), if your PCP is located in our provider database this encounter information will be shared with them immediately following your visit.   A [facility] MyChart account gives you access to today's visit and all your visits, tests, and labs performed at [facility]  click here if you don't have a [facility] MyChart account or go to [email or web address]  Consent: (Patient) John [name] provided verbal consent for this virtual visit at the beginning of the encounter.  Current Medications:  Current Outpatient Medications:    Accu-Chek Softclix Lancets lancets, USE CHECK GLUCOSE ONCE TO TWICE DAILY, Disp: 100 each, Rfl: 3   amLODipine  (NORVASC ) 10 MG tablet, Take 1 tablet (10 mg total) by mouth at bedtime., Disp: 90 tablet, Rfl: 3   amoxicillin -clavulanate (AUGMENTIN ) 875-125 MG tablet, Take 1 tablet by mouth 2 (two) times daily., Disp: 14 tablet, Rfl: 0   azelastine  (ASTELIN ) 0.1 % nasal spray, Place 2 sprays into both nostrils 2 (two) times daily. Use in each nostril as directed, Disp: 30 mL, Rfl: 0   baclofen  (LIORESAL ) 10 MG tablet, Take 1 tablet (10 mg total) by mouth 2 (two) times daily as needed for muscle spasms., Disp: 20 each, Rfl: 0   BD PEN NEEDLE NANO 2ND GEN 32G X 4 MM MISC, USE NEEDLE WITH INSULIN  PEN TO INJECT YOUR INSULIN  AS DIRECTED, Disp: 100 each, Rfl: 3   Blood Glucose Monitoring Suppl (ACCU-CHEK GUIDE) w/Device KIT, Use to check blood sugars 1-2 times daily., Disp: 1 kit, Rfl: 0   cetirizine  (ZYRTEC ) 10 MG tablet, Take 1 tablet by mouth daily., Disp: , Rfl:    diclofenac  Sodium (VOLTAREN  ARTHRITIS PAIN) 1 % GEL, Apply 4 g topically 4 (four) times daily., Disp: 4 g, Rfl: 0   Elastic Bandages & Supports (WRIST SPLINT/ELASTIC RIGHT XL) MISC, 1 Application by Does not apply route daily., Disp: 1 each, Rfl: 0   fluticasone   (FLONASE ) 50 MCG/ACT nasal spray, Place 2 sprays into both nostrils daily., Disp: 16 g, Rfl: 6   gabapentin  (NEURONTIN ) 100 MG capsule, Take 1 capsule (100 mg total) by mouth 3 (three) times daily., Disp: 90 capsule, Rfl: 1   glucose blood (ACCU-CHEK GUIDE TEST) test strip, Use to check blood sugar once to twice daily, Disp: 100 each, Rfl: 3   JARDIANCE  10 MG TABS tablet, TAKE 1 TABLET BY MOUTH ONCE DAILY BEFORE BREAKFAST, Disp: 30 tablet, Rfl: 0   LANTUS  SOLOSTAR 100 UNIT/ML Solostar Pen, Inject 25 Units into the skin at bedtime., Disp: 30 mL, Rfl: 1   lidocaine  (LIDODERM ) 5 %, Place 1 patch onto the skin daily. Remove & Discard patch within 12 hours or as directed by MD, Disp: 30 patch, Rfl: 0   losartan -hydrochlorothiazide  (HYZAAR) 50-12.5 MG tablet, Take 1 tablet by mouth daily., Disp: 30 tablet, Rfl: 11   meclizine  (ANTIVERT ) 25 MG tablet, Take 1 tablet (25 mg total) by mouth 3 (three) times daily as needed for dizziness., Disp: 30 tablet, Rfl: 0   omeprazole  (PRILOSEC) 20 MG capsule, Take 1 capsule (20 mg total) by mouth daily before breakfast., Disp: 30 capsule, Rfl: 0   ondansetron  (ZOFRAN -ODT) 4 MG disintegrating tablet, Take 1 tablet (4 mg total) by mouth every 8 (eight) hours as needed for nausea or vomiting., Disp: 20 tablet, Rfl: 0   rosuvastatin  (CRESTOR ) 20 MG tablet, Take 1 tablet by mouth  once daily, Disp: 90 tablet, Rfl: 0   Semaglutide , 1 MG/DOSE, 4 MG/3ML SOPN, Inject 1 mg into the skin once a week., Disp: 3 mL, Rfl: 3   Medications ordered in this encounter:  No orders of the defined types were placed in this encounter.    *If you need refills on other medications prior to your next appointment, please contact your pharmacy*  Follow-Up: Call back or seek an in-person evaluation if the symptoms worsen or if the condition fails to improve as anticipated.  [facility] [phone]  Other Instructions Please report to the nearest Emergency room with any  worsening symptoms. Follow up with primary care provider (PCP) in 2 -3 days.    If you have been instructed to have an in-person evaluation today at a local Urgent Care facility, please use the link below. It will take you to a list of all of our available [facility], including address, phone number and hours of operation. Please do not delay care.  [facility]  If you or a family member do not have a primary care provider, use the link below to schedule a visit and establish care. When you choose a [facility] primary care physician or advanced practice provider, you gain a long-term partner in health. Find a Primary Care Provider  Learn more about [facility]'s in-office and virtual care options:  - Get Care Now

## 2024-02-21 ENCOUNTER — Ambulatory Visit (HOSPITAL_COMMUNITY)
Admission: EM | Admit: 2024-02-21 | Discharge: 2024-02-21 | Disposition: A | Discharge status: Home / Self Care | Attending: Nurse Practitioner | Admitting: Nurse Practitioner

## 2024-02-21 ENCOUNTER — Encounter (HOSPITAL_COMMUNITY): Payer: Self-pay

## 2024-02-21 DIAGNOSIS — M542 Cervicalgia: Secondary | ICD-10-CM

## 2024-02-21 DIAGNOSIS — J329 Chronic sinusitis, unspecified: Secondary | ICD-10-CM

## 2024-02-21 DIAGNOSIS — M5412 Radiculopathy, cervical region: Secondary | ICD-10-CM | POA: Diagnosis not present

## 2024-02-21 MED ORDER — CHLORPHEN-PE-ACETAMINOPHEN 4-10-325 MG PO TABS: 1.0000 | ORAL_TABLET | Freq: Three times a day (TID) | ORAL | 0 refills | Status: AC

## 2024-02-21 MED ORDER — CYCLOBENZAPRINE HCL 10 MG PO TABS: 10.0000 mg | ORAL_TABLET | Freq: Every day | ORAL | 0 refills | Status: DC

## 2024-02-21 MED ORDER — KETOROLAC TROMETHAMINE 60 MG/2ML IM SOLN
60.0000 mg | Freq: Once | INTRAMUSCULAR | Status: AC
  Administered 2024-02-21 (×2): 60 mg via INTRAMUSCULAR

## 2024-02-21 MED ORDER — KETOROLAC TROMETHAMINE 60 MG/2ML IM SOLN
INTRAMUSCULAR | Status: AC
  Filled 2024-02-21: qty 2

## 2024-02-21 MED ORDER — METHOCARBAMOL 500 MG PO TABS: 500.0000 mg | ORAL_TABLET | Freq: Every morning | ORAL | 0 refills | Status: DC

## 2024-02-21 NOTE — ED Provider Notes (Signed)
 [facility]    CSN: [id number] Arrival date & time: 02/21/24  9044      History   Chief Complaint Chief Complaint  Patient presents with   Arm Pain    Neck pain    HPI John [name] is a 58 y.o. male.   Discussed the use of AI scribe software for clinical note transcription with the patient, who gave verbal consent to proceed.   The patient presents with neck pain, cold sensation in fingers, and sinus-related symptoms. The patient has a history of cervical radiculopathy and recent treatment for left-sided neck pain.  The patient reports experiencing left neck pain that radiates down to the arm. The pain was severe last night, prompting the patient to call his son for assistance. The patient describes a cold sensation as well as pain that radiates from the neck down his entire left arm and into the fingers.   The patient's neck pain history dates back to at least May, when he visited Urgent Care on May 31st for left-sided neck pain that had persisted for about a month prior to that visit. No reported injury. X-rays at that time showed arthritis of the cervical spine. The patient was prescribed Mobic  and gabapentin . He was seen by sports medicine and diagnosed with cervical radiculopathy. He was referred to [facility] for an intra-foraminal steroid injection which was administered later in June but reportedly did not provide relief.  Concurrent with the neck pain, the patient is experiencing sinus-related symptoms. These include nasal congestion, post-nasal drainage, and right ear pain. The patient reports feeling pressure in his nose and front teeth, which sometimes leads to headaches. He also mentions experiencing chills and recent nausea. A recent video visit addressed mouth pain, and the patient was recommended to take Xyzal  for allergy symptoms, as Zyrtec  was not effective.  Patient reports that the Xyzal  makes him feel crazy and he has not taken it.  Patient reports that  his previously prescribed Mobic  and gabapentin  also did not provide any relief for his symptoms.  He is specifically asking for a shot or something and something to relieve his pain.  The patient denies having a sore throat, cough, shortness of breath, wheezing, fever, body aches, vomiting, or diarrhea.  The following portions of the patient's history were reviewed and updated as appropriate: allergies, current medications, past family history, past medical history, past social history, past surgical history, and problem list.        Past Medical History:  Diagnosis Date   Anxiety disorder    Erectile dysfunction    Hyperlipidemia    Hypertension    Obesity, morbid, BMI 50 or higher (HCC)    Poorly controlled type 2 diabetes mellitus (HCC)    Schizophrenia (HCC)    Sleep apnea     Patient Active Problem List   Diagnosis Date Noted   Dizziness 02/08/2024   Shoulder pain, bilateral 01/11/2023   BPPV (benign paroxysmal positional vertigo) 01/11/2023   Left cervical radiculopathy 09/29/2022   Healthcare maintenance 12/03/2021   Erectile dysfunction 02/28/2020   Hyperlipidemia associated with type 2 diabetes mellitus (HCC) 10/18/2019   Type 2 diabetes mellitus with unspecified complications (HCC) 07/26/2019   Schizophrenia (HCC) 07/25/2019   Anxiety disorder 07/25/2019   OSA (obstructive sleep apnea) 08/17/2013   Morbid obesity (HCC) 08/17/2013   Hypertension 08/17/2013    Past Surgical History:  Procedure Laterality Date   ANKLE SURGERY     APPENDECTOMY  Home Medications    Prior to Admission medications   Medication Sig Start Date End Date Taking? Authorizing Provider  Chlorphen-PE-Acetaminophen  4-10-325 MG TABS Take 1 tablet by mouth in the morning, at noon, and at bedtime. 02/21/24  Yes Iola Lukes, FNP  cyclobenzaprine  (FLEXERIL ) 10 MG tablet Take 1 tablet (10 mg total) by mouth at bedtime. 02/21/24  Yes Iola Lukes, FNP  methocarbamol   (ROBAXIN ) 500 MG tablet Take 1 tablet (500 mg total) by mouth every morning. 02/21/24  Yes Iola Lukes, FNP  Accu-Chek Softclix Lancets lancets USE CHECK GLUCOSE ONCE TO TWICE DAILY 08/26/23   Alexander-Savino, [place], MD  amLODipine  (NORVASC ) 10 MG tablet Take 1 tablet (10 mg total) by mouth at bedtime. 08/26/23   Alexander-Savino, [place], MD  amoxicillin -clavulanate (AUGMENTIN ) 875-125 MG tablet Take 1 tablet by mouth 2 (two) times daily. 01/26/24   Gladis Elsie BROCKS, PA-C  azelastine  (ASTELIN ) 0.1 % nasal spray Place 2 sprays into both nostrils 2 (two) times daily. Use in each nostril as directed 02/03/24   Johnie Flaming A, NP  baclofen  (LIORESAL ) 10 MG tablet Take 1 tablet (10 mg total) by mouth 2 (two) times daily as needed for muscle spasms. 12/11/23   Raspet, Erin K, PA-C  BD PEN NEEDLE NANO 2ND GEN 32G X 4 MM MISC USE NEEDLE WITH INSULIN  PEN TO INJECT YOUR INSULIN  AS DIRECTED 08/26/23   Alexander-Savino, [place], MD  Blood Glucose Monitoring Suppl (ACCU-CHEK GUIDE) w/Device KIT Use to check blood sugars 1-2 times daily. 03/26/23   Fernand Prost, MD  diclofenac  Sodium (VOLTAREN  ARTHRITIS PAIN) 1 % GEL Apply 4 g topically 4 (four) times daily. 08/26/23   Alexander-Savino, [place], MD  Elastic Bandages & Supports (WRIST SPLINT/ELASTIC RIGHT XL) MISC 1 Application by Does not apply route daily. 08/26/23   Alexander-Savino, [place], MD  fluticasone  (FLONASE ) 50 MCG/ACT nasal spray Place 2 sprays into both nostrils daily. 12/12/23   Lavell Bari LABOR, FNP  gabapentin  (NEURONTIN ) 100 MG capsule Take 1 capsule (100 mg total) by mouth 3 (three) times daily. 09/01/23 11/30/23  Elnora Ip, MD  glucose blood (ACCU-CHEK GUIDE TEST) test strip Use to check blood sugar once to twice daily 12/23/23   Gregary Sharper, MD  JARDIANCE  10 MG TABS tablet TAKE 1 TABLET BY MOUTH ONCE DAILY BEFORE BREAKFAST 02/02/24   Tawkaliyar, Roya, DO  LANTUS  SOLOSTAR 100 UNIT/ML Solostar Pen Inject 25 Units into the skin  at bedtime. 01/04/24   Jolaine Pac, DO  lidocaine  (LIDODERM ) 5 % Place 1 patch onto the skin daily. Remove & Discard patch within 12 hours or as directed by MD 12/11/23   Raspet, Rocky K, PA-C  losartan -hydrochlorothiazide  (HYZAAR) 50-12.5 MG tablet Take 1 tablet by mouth daily. 01/10/24 01/09/25  Amoako, [place], MD  meclizine  (ANTIVERT ) 25 MG tablet Take 1 tablet (25 mg total) by mouth 3 (three) times daily as needed for dizziness. 02/07/24   Marylu Gee, DO  omeprazole  (PRILOSEC) 20 MG capsule Take 1 capsule (20 mg total) by mouth daily before breakfast. 10/30/23   Fleming, Zelda W, NP  ondansetron  (ZOFRAN -ODT) 4 MG disintegrating tablet Take 1 tablet (4 mg total) by mouth every 8 (eight) hours as needed for nausea or vomiting. 12/28/23   Reddick, Johnathan B, NP  rosuvastatin  (CRESTOR ) 20 MG tablet Take 1 tablet by mouth once daily 11/24/23   Fernand Prost, MD  Semaglutide , 1 MG/DOSE, 4 MG/3ML SOPN Inject 1 mg into the skin once a week. 12/23/23   Gregary Sharper, MD    Family History  Family History  Problem Relation Age of Onset   Breast cancer Mother    Alcohol abuse Father    Hypertension Sister    Breast cancer Sister    Hypertension Brother    Diabetes type II Brother    Colon cancer Paternal Uncle        possible but not sure   Colon polyps Neg Hx    Rectal cancer Neg Hx    Stomach cancer Neg Hx     Social History Social History   Tobacco Use   Smoking status: Never   Smokeless tobacco: Never  Vaping Use   Vaping status: Never Used  Substance Use Topics   Alcohol use: Yes    Comment: rare in the last two years, 1x in 2-3 months   Drug use: Yes    Types: Marijuana    Comment: Occasional     Allergies   Patient has no known allergies.   Review of Systems Review of Systems  Constitutional:  Positive for chills. Negative for fever.  HENT:  Positive for congestion, dental problem, postnasal drip and sinus pressure (in the front teeth). Negative for ear pain  (fluttering in right ear  sometimes occurs in the left as well) and sore throat.   Respiratory:  Positive for cough (mildy productive). Negative for shortness of breath and wheezing.   Gastrointestinal:  Positive for nausea. Negative for diarrhea and vomiting.  Musculoskeletal:  Positive for neck pain. Negative for myalgias.  Neurological:  Positive for headaches (due to pressure in the teeth). Negative for numbness.  All other systems reviewed and are negative.    Physical Exam Triage Vital Signs ED Triage Vitals  Encounter Vitals Group     BP 02/21/24 1055 (!) 141/91     Girls Systolic BP Percentile --      Girls Diastolic BP Percentile --      Boys Systolic BP Percentile --      Boys Diastolic BP Percentile --      Pulse Rate 02/21/24 1055 92     Resp 02/21/24 1055 18     Temp 02/21/24 1055 98.1 F (36.7 C)     Temp Source 02/21/24 1055 Oral     SpO2 02/21/24 1055 99 %     Weight --      Height --      Head Circumference --      Peak Flow --      Pain Score 02/21/24 1056 10     Pain Loc --      Pain Education --      Exclude from Growth Chart --    No data found.  Updated Vital Signs BP (!) 141/91 (BP Location: Right Arm) Comment: states his b/p meds in his pocket, hasn't took it yet.  Pulse 92   Temp 98.1 F (36.7 C) (Oral)   Resp 18   SpO2 99%   Visual Acuity Right Eye Distance:   Left Eye Distance:   Bilateral Distance:    Right Eye Near:   Left Eye Near:    Bilateral Near:     Physical Exam Vitals reviewed.  Constitutional:      General: He is awake. He is not in acute distress.    Appearance: Normal appearance. He is well-developed. He is obese. He is not ill-appearing, toxic-appearing or diaphoretic.  HENT:     Head: Normocephalic.     Right Ear: Hearing, tympanic membrane, ear canal and external ear normal. No drainage, swelling  or tenderness. No middle ear effusion. Tympanic membrane is not erythematous.     Left Ear: Hearing, tympanic  membrane, ear canal and external ear normal. No drainage, swelling or tenderness.  No middle ear effusion. Tympanic membrane is not erythematous.     Nose: Congestion and rhinorrhea present.     Right Sinus: No maxillary sinus tenderness or frontal sinus tenderness.     Left Sinus: No maxillary sinus tenderness or frontal sinus tenderness.     Mouth/Throat:     Lips: Pink.     Mouth: Mucous membranes are moist.     Pharynx: Oropharynx is clear. Uvula midline. No pharyngeal swelling, oropharyngeal exudate, posterior oropharyngeal erythema or uvula swelling.     Tonsils: No tonsillar exudate or tonsillar abscesses.  Eyes:     General: Vision grossly intact.     Conjunctiva/sclera: Conjunctivae normal.  Cardiovascular:     Rate and Rhythm: Normal rate and regular rhythm.     Heart sounds: Normal heart sounds.  Pulmonary:     Effort: Pulmonary effort is normal.     Breath sounds: Normal breath sounds and air entry.  Musculoskeletal:        General: Normal range of motion.     Cervical back: Normal range of motion and neck supple. No rigidity. Pain with movement and muscular tenderness present. No spinous process tenderness. Normal range of motion.  Lymphadenopathy:     Cervical: No cervical adenopathy.  Skin:    General: Skin is warm and dry.  Neurological:     General: No focal deficit present.     Mental Status: He is alert and oriented to person, place, and time.     Sensory: Sensation is intact.     Motor: Motor function is intact.     Coordination: Coordination is intact.     Gait: Gait is intact.  Psychiatric:        Mood and Affect: Mood normal.        Speech: Speech normal.        Behavior: Behavior normal. Behavior is cooperative.      UC Treatments / Results  Labs (all labs ordered are listed, but only abnormal results are displayed) Labs Reviewed - No data to display  EKG   Radiology No results found.  Procedures Procedures (including critical care  time)  Medications Ordered in UC Medications  ketorolac  (TORADOL ) injection 60 mg (60 mg Intramuscular Given 02/21/24 1311)    Initial Impression / Assessment and Plan / UC Course  I have reviewed the triage vital signs and the nursing notes.  Pertinent labs & imaging results that were available during my care of the patient were reviewed by me and considered in my medical decision making (see chart for details).     The patient presents with persistent left-sided neck pain radiating down the left arm, consistent with cervical radiculopathy, with a history of cervical spine arthritis confirmed by prior X-rays. He received a cervical injection in June without relief and has had ongoing symptoms since at least May, recently requiring assistance from family due to pain severity. He is followed by [facility] and has an MRI scheduled for August 15. A Toradol  injection was given for systemic pain relief, and he was prescribed robaxin  in the morning and flexeril  at night, with the option to use acetaminophen  or ibuprofen  for additional pain control. He was advised that narcotics will not be prescribed for his chronic condition, to follow up with [facility] for pain  management, and to use their orthopedic urgent care for walk-in visits as needed. Emergency evaluation was advised for worsening or changing symptoms.  The patient also reports nasal congestion, drainage, ear pain, and pressure in the nose and front teeth, with prior trials of Zyrtec  and Xyzal  being ineffective or causing side effects. He was prescribed Norel AD three times daily and instructed to resume daily Flonase , noting that he has refills available on his medication profile. He was advised to follow up with his primary care physician if sinus symptoms do not improve within one week.  Today's evaluation has revealed no signs of a dangerous process. Discussed diagnosis with patient and/or guardian. Patient and/or guardian aware of  their diagnosis, possible red flag symptoms to watch out for and need for close follow up. Patient and/or guardian understands verbal and written discharge instructions. Patient and/or guardian comfortable with plan and disposition.  Patient and/or guardian has a clear mental status at this time, good insight into illness (after discussion and teaching) and has clear judgment to make decisions regarding their care  Documentation was completed with the aid of voice recognition software. Transcription may contain typographical errors. Final Clinical Impressions(s) / UC Diagnoses   Final diagnoses:  Right cervical radiculopathy  Neck pain on left side  Chronic sinusitis, unspecified location     Discharge Instructions      You were seen today for neck pain radiating into your left arm, consistent with cervical radiculopathy, as well as sinus symptoms. For your neck pain, you received a Toradol  injection in the clinic. You should take Robaxin  in the morning and Flexeril  at night as prescribed, and you may also use acetaminophen  or ibuprofen  for additional pain relief if needed. Avoid activities that strain your neck, use a supportive pillow when resting, and apply heat or gentle stretching as tolerated. Follow up with [facility] as scheduled, and you may use their orthopedic urgent care for walk-in visits if your pain becomes difficult to manage. Go to the emergency room if your pain suddenly worsens, you develop new weakness, numbness, loss of bladder or bowel control, or any other concerning changes.  For your sinus symptoms, take Norel AD three times a day as prescribed, and resume daily use of Flonase  nasal spray. You have refills available, so request this from the pharmacy when picking up your other medications. Drink plenty of fluids, use a humidifier or saline nasal spray to help with congestion, and rest as much as possible. If your sinus symptoms do not improve within one week,  schedule a follow-up with your primary care provider. Seek immediate medical attention if you develop high fever, facial swelling, vision changes, severe headache, or worsening facial pain.      ED Prescriptions     Medication Sig Dispense Auth. Provider   Chlorphen-PE-Acetaminophen  4-10-325 MG TABS Take 1 tablet by mouth in the morning, at noon, and at bedtime. 21 tablet Iola Lukes, FNP   methocarbamol  (ROBAXIN ) 500 MG tablet Take 1 tablet (500 mg total) by mouth every morning. 10 tablet Iola Lukes, FNP   cyclobenzaprine  (FLEXERIL ) 10 MG tablet Take 1 tablet (10 mg total) by mouth at bedtime. 10 tablet Iola Lukes, FNP      PDMP not reviewed this encounter.   Iola Lukes, [place] 02/21/24 1328

## 2024-02-21 NOTE — Discharge Instructions (Addendum)
 You were seen today for neck pain radiating into your left arm, consistent with cervical radiculopathy, as well as sinus symptoms. For your neck pain, you received a Toradol  injection in the clinic. You should take Robaxin  in the morning and Flexeril  at night as prescribed, and you may also use acetaminophen  or ibuprofen  for additional pain relief if needed. Avoid activities that strain your neck, use a supportive pillow when resting, and apply heat or gentle stretching as tolerated. Follow up with [facility] as scheduled, and you may use their orthopedic urgent care for walk-in visits if your pain becomes difficult to manage. Go to the emergency room if your pain suddenly worsens, you develop new weakness, numbness, loss of bladder or bowel control, or any other concerning changes.  For your sinus symptoms, take Norel AD three times a day as prescribed, and resume daily use of Flonase  nasal spray. You have refills available, so request this from the pharmacy when picking up your other medications. Drink plenty of fluids, use a humidifier or saline nasal spray to help with congestion, and rest as much as possible. If your sinus symptoms do not improve within one week, schedule a follow-up with your primary care provider. Seek immediate medical attention if you develop high fever, facial swelling, vision changes, severe headache, or worsening facial pain.

## 2024-02-21 NOTE — ED Notes (Addendum)
 Pt c/o lt lower toothache and neck pain radiating down lt arm with numbness x3-4 months. States had a cortisone shot to lt neck 7/15 with no relief. States has scheduled for MRI. States took tylenol  and motrin  with no relief. States family member gave him 2 puffs of her albuterol  for unknown reason.

## 2024-02-22 ENCOUNTER — Telehealth: Payer: Self-pay

## 2024-02-22 NOTE — Telephone Encounter (Signed)
 Copied from CRM #[id number]. Topic: Clinical - Prescription Issue >> Feb 21, 2024  8:04 AM Benton KIDD wrote: Reason for CRM: c pap mask not fitting right and can't sleep for over ahour Keep having to adjust [phone]  Called patient.  Patient states he feels the mask does not fit right and he cannot sleep with the full face mask.  He thinks it may be leaking as well.  Informed patient he can go to [employer], his DME company, and try other masks to find a mask that he would be more comfortable with.  Patient is in agreement with this.  Gave patient phone number for [employer].

## 2024-02-25 ENCOUNTER — Ambulatory Visit: Payer: Self-pay

## 2024-02-25 DIAGNOSIS — M5412 Radiculopathy, cervical region: Secondary | ICD-10-CM | POA: Diagnosis not present

## 2024-02-25 NOTE — Telephone Encounter (Signed)
 FYI Only or Action Required?: FYI only for provider.  Patient was last seen in primary care on 02/07/2024 by Marylu Gee, DO.  Called Nurse Triage reporting Arm Pain.  Symptoms began several months ago.  Interventions attempted: Nothing.  Symptoms are: unchanged.  Triage Disposition: See PCP Within 2 Weeks  Patient/caregiver understands and will follow disposition?: Yes  Copied from CRM #[id number]. Topic: Clinical - Red Word Triage >> Feb 25, 2024  4:34 PM Mercer PEDLAR wrote: Red Word that prompted transfer to Nurse Triage: Patient stated that he has pain in his arms after taking losartan -hydrochlorothiazide  (HYZAAR) 50-12.5 MG tablet. Reason for Disposition  Arm pain is a chronic symptom (recurrent or ongoing AND present > 4 weeks)  Additional Information  Negative: [1] Age > 40 AND [2] no obvious cause AND [3] pain even when not moving the arm  (Exception: Pain is clearly made worse by moving arm or bending neck.)    Has been evaluated pinched nerve  Negative: [1] SEVERE pain (e.g., excruciating) AND [2] not improved 2 hours after pain medicine    Has not tried any medicine today  Answer Assessment - Initial Assessment Questions 1. ONSET: When did the pain start?     Intermittent for a few month 2. LOCATION: Where is the pain located?     Right arm 3. PAIN: How bad is the pain? (Scale 0-10; or none, mild, moderate, severe)     Up to 10/10, current 9/10 4. WORK OR EXERCISE: Has there been any recent work or exercise that involved this part of the body?     No 5. CAUSE: What do you think is causing the arm pain?     Feels it is the losartan -hctz 6. OTHER SYMPTOMS: Do you have any other symptoms? (e.g., neck pain, swelling, rash, fever, numbness, weakness)     Denies   Additional info: Stopped Losartan /hydrochlorothiazide  for a few days and pain resolved, took dose 2 hours ago and now right arm pain. Denies palpitation, chest pain, shortness of breath and all other  symptoms. States he was evaluated in the ED last week for this same right arm pain was advised he has a pinched nerve, received injection with relief.   Current bp 147/90, pulse 84.  Protocols used: Arm Pain-A-AH

## 2024-02-28 ENCOUNTER — Ambulatory Visit: Payer: Self-pay | Admitting: Student

## 2024-02-28 ENCOUNTER — Other Ambulatory Visit: Payer: Self-pay | Admitting: Student

## 2024-02-28 NOTE — Telephone Encounter (Signed)
 Medication sent to pharmacy

## 2024-03-01 ENCOUNTER — Encounter: Payer: Self-pay | Admitting: Student

## 2024-03-01 DIAGNOSIS — M545 Low back pain, unspecified: Secondary | ICD-10-CM | POA: Diagnosis not present

## 2024-03-02 DIAGNOSIS — M545 Low back pain, unspecified: Secondary | ICD-10-CM | POA: Diagnosis not present

## 2024-03-02 DIAGNOSIS — R300 Dysuria: Secondary | ICD-10-CM | POA: Diagnosis not present

## 2024-03-03 ENCOUNTER — Telehealth: Admitting: Physician Assistant

## 2024-03-03 DIAGNOSIS — R300 Dysuria: Secondary | ICD-10-CM

## 2024-03-03 NOTE — Progress Notes (Signed)
 E-Visit for Urinary Problems  Based on what you shared with me, I feel your condition warrants further evaluation and I recommend that you be seen for a face to face office visit.  Male bladder infections are not very common.  We worry about prostate or kidney conditions.  The standard of care is to examine the abdomen and kidneys, and to do a urine and blood test to make sure that something more serious is not going on.  We recommend that you see a provider today.  If your doctor's office is closed [facility] has the following Urgent Cares:   NOTE: There will be NO CHARGE for this E-Visit   If you are having a true medical emergency, please call 911.     For an urgent face to face visit, [facility] has multiple urgent care centers for your convenience.  Click the link below for the full list of locations and hours, walk-in wait times, appointment scheduling options and driving directions:  Urgent Care - [place], [place], [place], [place], [place], [place]  [place]     Your MyChart E-visit questionnaire answers were reviewed by a board certified advanced clinical practitioner to complete your personal care plan based on your specific symptoms.  Thank you for using e-Visits.        I have spent 5 minutes in review of e-visit questionnaire, review and updating patient chart, medical decision making and response to patient.   [name], PA-C

## 2024-03-07 DIAGNOSIS — M5412 Radiculopathy, cervical region: Secondary | ICD-10-CM | POA: Diagnosis not present

## 2024-03-09 ENCOUNTER — Telehealth: Admitting: Physician Assistant

## 2024-03-09 DIAGNOSIS — B9689 Other specified bacterial agents as the cause of diseases classified elsewhere: Secondary | ICD-10-CM

## 2024-03-09 DIAGNOSIS — J208 Acute bronchitis due to other specified organisms: Secondary | ICD-10-CM | POA: Diagnosis not present

## 2024-03-09 MED ORDER — AZITHROMYCIN 250 MG PO TABS: ORAL_TABLET | ORAL | 0 refills | Status: DC

## 2024-03-09 NOTE — Progress Notes (Signed)
 Virtual Visit Consent   John [name], you are scheduled for a virtual visit with a  provider today. Just as with appointments in the office, your consent must be obtained to participate. Your consent will be active for this visit and any virtual visit you may have with one of our providers in the next 365 days. If you have a MyChart account, a copy of this consent can be sent to you electronically.  As this is a virtual visit, video technology does not allow for your provider to perform a traditional examination. This may limit your provider's ability to fully assess your condition. If your provider identifies any concerns that need to be evaluated in person or the need to arrange testing (such as labs, EKG, etc.), we will make arrangements to do so. Although advances in technology are sophisticated, we cannot ensure that it will always work on either your end or our end. If the connection with a video visit is poor, the visit may have to be switched to a telephone visit. With either a video or telephone visit, we are not always able to ensure that we have a secure connection.  By engaging in this virtual visit, you consent to the provision of healthcare and authorize for your insurance to be billed (if applicable) for the services provided during this visit. Depending on your insurance coverage, you may receive a charge related to this service.  I need to obtain your verbal consent now. Are you willing to proceed with your visit today? John [name] has provided verbal consent on 03/09/2024 for a virtual visit (video or telephone). John [name], [place]  Date: 03/09/2024 1:09 PM   Virtual Visit via Video Note   I, John [name], connected with  John [name]  ([id number], 12/05/1965) on 03/09/24 at 12:15 PM EDT by a video-enabled telemedicine application and verified that I am speaking with the correct person using two identifiers.  Location: Patient: Virtual Visit Location  Patient: Home Provider: Virtual Visit Location Provider: Home Office   I discussed the limitations of evaluation and management by telemedicine and the availability of in person appointments. The patient expressed understanding and agreed to proceed.    History of Present Illness: John [name] is a 58 y.o. who identifies as a male who was assigned male at birth, and is being seen today for chest congestion and cough over the past several days, now with worsening of congestion with increased sputum production and change in coloration of sputum from clear to white to green. Denies fever. Has been taking OTC Robitussin-DM.Denies recent travel or sick contact.  HPI: HPI  Problems:  Patient Active Problem List   Diagnosis Date Noted   Dizziness 02/08/2024   Shoulder pain, bilateral 01/11/2023   BPPV (benign paroxysmal positional vertigo) 01/11/2023   Left cervical radiculopathy 09/29/2022   Healthcare maintenance 12/03/2021   Erectile dysfunction 02/28/2020   Hyperlipidemia associated with type 2 diabetes mellitus (HCC) 10/18/2019   Type 2 diabetes mellitus with unspecified complications (HCC) 07/26/2019   Schizophrenia (HCC) 07/25/2019   Anxiety disorder 07/25/2019   OSA (obstructive sleep apnea) 08/17/2013   Morbid obesity (HCC) 08/17/2013   Hypertension 08/17/2013    Allergies: No Known Allergies Medications:  Current Outpatient Medications:    azithromycin  (ZITHROMAX ) 250 MG tablet, Take 2 tablets on day 1, then 1 tablet daily on days 2 through 5, Disp: 6 tablet, Rfl: 0   Accu-Chek Softclix Lancets lancets, USE CHECK GLUCOSE ONCE TO TWICE  DAILY, Disp: 100 each, Rfl: 3   amLODipine  (NORVASC ) 10 MG tablet, Take 1 tablet (10 mg total) by mouth at bedtime., Disp: 90 tablet, Rfl: 3   azelastine  (ASTELIN ) 0.1 % nasal spray, Place 2 sprays into both nostrils 2 (two) times daily. Use in each nostril as directed, Disp: 30 mL, Rfl: 0   baclofen  (LIORESAL ) 10 MG tablet, Take 1 tablet (10 mg  total) by mouth 2 (two) times daily as needed for muscle spasms., Disp: 20 each, Rfl: 0   BD PEN NEEDLE NANO 2ND GEN 32G X 4 MM MISC, USE NEEDLE WITH INSULIN  PEN TO INJECT YOUR INSULIN  AS DIRECTED, Disp: 100 each, Rfl: 3   Blood Glucose Monitoring Suppl (ACCU-CHEK GUIDE) w/Device KIT, Use to check blood sugars 1-2 times daily., Disp: 1 kit, Rfl: 0   Chlorphen-PE-Acetaminophen  4-10-325 MG TABS, Take 1 tablet by mouth in the morning, at noon, and at bedtime., Disp: 21 tablet, Rfl: 0   cyclobenzaprine  (FLEXERIL ) 10 MG tablet, Take 1 tablet (10 mg total) by mouth at bedtime., Disp: 10 tablet, Rfl: 0   diclofenac  Sodium (VOLTAREN  ARTHRITIS PAIN) 1 % GEL, Apply 4 g topically 4 (four) times daily., Disp: 4 g, Rfl: 0   Elastic Bandages & Supports (WRIST SPLINT/ELASTIC RIGHT XL) MISC, 1 Application by Does not apply route daily., Disp: 1 each, Rfl: 0   fluticasone  (FLONASE ) 50 MCG/ACT nasal spray, Place 2 sprays into both nostrils daily., Disp: 16 g, Rfl: 6   gabapentin  (NEURONTIN ) 100 MG capsule, Take 1 capsule (100 mg total) by mouth 3 (three) times daily., Disp: 90 capsule, Rfl: 1   glucose blood (ACCU-CHEK GUIDE TEST) test strip, Use to check blood sugar once to twice daily, Disp: 100 each, Rfl: 3   JARDIANCE  10 MG TABS tablet, TAKE 1 TABLET BY MOUTH ONCE DAILY BEFORE BREAKFAST, Disp: 30 tablet, Rfl: 0   LANTUS  SOLOSTAR 100 UNIT/ML Solostar Pen, Inject 25 Units into the skin at bedtime., Disp: 30 mL, Rfl: 1   lidocaine  (LIDODERM ) 5 %, Place 1 patch onto the skin daily. Remove & Discard patch within 12 hours or as directed by MD, Disp: 30 patch, Rfl: 0   losartan -hydrochlorothiazide  (HYZAAR) 50-12.5 MG tablet, Take 1 tablet by mouth daily., Disp: 30 tablet, Rfl: 11   meclizine  (ANTIVERT ) 25 MG tablet, Take 1 tablet (25 mg total) by mouth 3 (three) times daily as needed for dizziness., Disp: 30 tablet, Rfl: 0   methocarbamol  (ROBAXIN ) 500 MG tablet, Take 1 tablet (500 mg total) by mouth every morning., Disp:  10 tablet, Rfl: 0   omeprazole  (PRILOSEC) 20 MG capsule, Take 1 capsule (20 mg total) by mouth daily before breakfast., Disp: 30 capsule, Rfl: 0   ondansetron  (ZOFRAN -ODT) 4 MG disintegrating tablet, Take 1 tablet (4 mg total) by mouth every 8 (eight) hours as needed for nausea or vomiting., Disp: 20 tablet, Rfl: 0   rosuvastatin  (CRESTOR ) 20 MG tablet, Take 1 tablet by mouth once daily, Disp: 90 tablet, Rfl: 0   Semaglutide , 1 MG/DOSE, 4 MG/3ML SOPN, Inject 1 mg into the skin once a week., Disp: 3 mL, Rfl: 3  Observations/Objective: Patient is well-developed, well-nourished in no acute distress.  Resting comfortably at home.  Head is normocephalic, atraumatic.  No labored breathing. Speech is clear and coherent with logical content.  Patient is alert and oriented at baseline.   Assessment and Plan: 1. Acute bacterial bronchitis (Primary) - azithromycin  (ZITHROMAX ) 250 MG tablet; Take 2 tablets on day 1, then 1 tablet  daily on days 2 through 5  Dispense: 6 tablet; Refill: 0  Rx Azithromycin .  Increase fluids.  Rest.  Saline nasal spray.  Probiotic.  Mucinex as directed.  Humidifier in bedroom.  Continue Robitussin-DM OTC.  Call or return to clinic if symptoms are not improving.   Follow Up Instructions: I discussed the assessment and treatment plan with the patient. The patient was provided an opportunity to ask questions and all were answered. The patient agreed with the plan and demonstrated an understanding of the instructions.  A copy of instructions were sent to the patient via MyChart unless otherwise noted below.   The patient was advised to call back or seek an in-person evaluation if the symptoms worsen or if the condition fails to improve as anticipated.    John Velma Lunger, PA-C

## 2024-03-09 NOTE — Patient Instructions (Signed)
 [name], thank you for joining [name], PA-C for today's virtual visit.  While this provider is not your primary care provider (PCP), if your PCP is located in our provider database this encounter information will be shared with them immediately following your visit.   A [facility] MyChart account gives you access to today's visit and all your visits, tests, and labs performed at [facility]  click here if you don't have a [facility] MyChart account or go to [email or web address]  Consent: (Patient) NIK GORRELL provided verbal consent for this virtual visit at the beginning of the encounter.  Current Medications:  Current Outpatient Medications:    Accu-Chek Softclix Lancets lancets, USE CHECK GLUCOSE ONCE TO TWICE DAILY, Disp: 100 each, Rfl: 3   amLODipine  (NORVASC ) 10 MG tablet, Take 1 tablet (10 mg total) by mouth at bedtime., Disp: 90 tablet, Rfl: 3   amoxicillin -clavulanate (AUGMENTIN ) 875-125 MG tablet, Take 1 tablet by mouth 2 (two) times daily., Disp: 14 tablet, Rfl: 0   azelastine  (ASTELIN ) 0.1 % nasal spray, Place 2 sprays into both nostrils 2 (two) times daily. Use in each nostril as directed, Disp: 30 mL, Rfl: 0   baclofen  (LIORESAL ) 10 MG tablet, Take 1 tablet (10 mg total) by mouth 2 (two) times daily as needed for muscle spasms., Disp: 20 each, Rfl: 0   BD PEN NEEDLE NANO 2ND GEN 32G X 4 MM MISC, USE NEEDLE WITH INSULIN  PEN TO INJECT YOUR INSULIN  AS DIRECTED, Disp: 100 each, Rfl: 3   Blood Glucose Monitoring Suppl (ACCU-CHEK GUIDE) w/Device KIT, Use to check blood sugars 1-2 times daily., Disp: 1 kit, Rfl: 0   Chlorphen-PE-Acetaminophen  4-10-325 MG TABS, Take 1 tablet by mouth in the morning, at noon, and at bedtime., Disp: 21 tablet, Rfl: 0   cyclobenzaprine  (FLEXERIL ) 10 MG tablet, Take 1 tablet (10 mg total) by mouth at bedtime., Disp: 10 tablet, Rfl: 0   diclofenac  Sodium (VOLTAREN  ARTHRITIS PAIN) 1 % GEL, Apply 4 g topically 4 (four)  times daily., Disp: 4 g, Rfl: 0   Elastic Bandages & Supports (WRIST SPLINT/ELASTIC RIGHT XL) MISC, 1 Application by Does not apply route daily., Disp: 1 each, Rfl: 0   fluticasone  (FLONASE ) 50 MCG/ACT nasal spray, Place 2 sprays into both nostrils daily., Disp: 16 g, Rfl: 6   gabapentin  (NEURONTIN ) 100 MG capsule, Take 1 capsule (100 mg total) by mouth 3 (three) times daily., Disp: 90 capsule, Rfl: 1   glucose blood (ACCU-CHEK GUIDE TEST) test strip, Use to check blood sugar once to twice daily, Disp: 100 each, Rfl: 3   JARDIANCE  10 MG TABS tablet, TAKE 1 TABLET BY MOUTH ONCE DAILY BEFORE BREAKFAST, Disp: 30 tablet, Rfl: 0   LANTUS  SOLOSTAR 100 UNIT/ML Solostar Pen, Inject 25 Units into the skin at bedtime., Disp: 30 mL, Rfl: 1   lidocaine  (LIDODERM ) 5 %, Place 1 patch onto the skin daily. Remove & Discard patch within 12 hours or as directed by MD, Disp: 30 patch, Rfl: 0   losartan -hydrochlorothiazide  (HYZAAR) 50-12.5 MG tablet, Take 1 tablet by mouth daily., Disp: 30 tablet, Rfl: 11   meclizine  (ANTIVERT ) 25 MG tablet, Take 1 tablet (25 mg total) by mouth 3 (three) times daily as needed for dizziness., Disp: 30 tablet, Rfl: 0   methocarbamol  (ROBAXIN ) 500 MG tablet, Take 1 tablet (500 mg total) by mouth every morning., Disp: 10 tablet, Rfl: 0   omeprazole  (PRILOSEC) 20 MG capsule, Take 1 capsule (20 mg total) by  mouth daily before breakfast., Disp: 30 capsule, Rfl: 0   ondansetron  (ZOFRAN -ODT) 4 MG disintegrating tablet, Take 1 tablet (4 mg total) by mouth every 8 (eight) hours as needed for nausea or vomiting., Disp: 20 tablet, Rfl: 0   rosuvastatin  (CRESTOR ) 20 MG tablet, Take 1 tablet by mouth once daily, Disp: 90 tablet, Rfl: 0   Semaglutide , 1 MG/DOSE, 4 MG/3ML SOPN, Inject 1 mg into the skin once a week., Disp: 3 mL, Rfl: 3   Medications ordered in this encounter:  No orders of the defined types were placed in this encounter.    *If you need refills on other medications prior to your  next appointment, please contact your pharmacy*  Follow-Up: Call back or seek an in-person evaluation if the symptoms worsen or if the condition fails to improve as anticipated.  [facility] [phone]  Other Instructions Take antibiotic (Azithromycin ) as directed.  Increase fluids.  Get plenty of rest. Use Mucinex for congestion. Take a daily probiotic (I recommend Align or Culturelle, but even Activia Yogurt may be beneficial).  A humidifier placed in the bedroom may offer some relief for a dry, scratchy throat of nasal irritation.  Read information below on acute bronchitis. Please call or return to clinic if symptoms are not improving.  Acute Bronchitis Bronchitis is when the airways that extend from the windpipe into the lungs get red, puffy, and painful (inflamed). Bronchitis often causes thick spit (mucus) to develop. This leads to a cough. A cough is the most common symptom of bronchitis. In acute bronchitis, the condition usually begins suddenly and goes away over time (usually in 2 weeks). Smoking, allergies, and asthma can make bronchitis worse. Repeated episodes of bronchitis may cause more lung problems.  HOME CARE Rest. Drink enough fluids to keep your pee (urine) clear or pale yellow (unless you need to limit fluids as told by your doctor). Only take over-the-counter or prescription medicines as told by your doctor. Avoid smoking and secondhand smoke. These can make bronchitis worse. If you are a smoker, think about using nicotine gum or skin patches. Quitting smoking will help your lungs heal faster. Reduce the chance of getting bronchitis again by: Washing your hands often. Avoiding people with cold symptoms. Trying not to touch your hands to your mouth, nose, or eyes. Follow up with your doctor as told.  GET HELP IF: Your symptoms do not improve after 1 week of treatment. Symptoms include: Cough. Fever. Coughing up thick spit. Body aches. Chest  congestion. Chills. Shortness of breath. Sore throat.  GET HELP RIGHT AWAY IF:  You have an increased fever. You have chills. You have severe shortness of breath. You have bloody thick spit (sputum). You throw up (vomit) often. You lose too much body fluid (dehydration). You have a severe headache. You faint.  MAKE SURE YOU:  Understand these instructions. Will watch your condition. Will get help right away if you are not doing well or get worse. Document Released: 12/16/2007 Document Revised: 03/01/2013 Document Reviewed: 12/20/2012 [facility] Patient Information 2015 [place], [place]. This information is not intended to replace advice given to you by your health care provider. Make sure you discuss any questions you have with your health care provider.    If you have been instructed to have an in-person evaluation today at a local Urgent Care facility, please use the link below. It will take you to a list of all of our available [place] [facility], including address, phone number and  hours of operation. Please do not delay care.  [facility]  If you or a family member do not have a primary care provider, use the link below to schedule a visit and establish care. When you choose a [facility] primary care physician or advanced practice provider, you gain a long-term partner in health. Find a Primary Care Provider  Learn more about [place]'s in-office and virtual care options: [place] - Get Care Now

## 2024-03-13 DIAGNOSIS — G4733 Obstructive sleep apnea (adult) (pediatric): Secondary | ICD-10-CM | POA: Diagnosis not present

## 2024-03-14 ENCOUNTER — Telehealth: Admitting: Physician Assistant

## 2024-03-14 DIAGNOSIS — R062 Wheezing: Secondary | ICD-10-CM | POA: Diagnosis not present

## 2024-03-14 DIAGNOSIS — J9801 Acute bronchospasm: Secondary | ICD-10-CM | POA: Diagnosis not present

## 2024-03-14 MED ORDER — PREDNISONE 20 MG PO TABS: 40.0000 mg | ORAL_TABLET | Freq: Every day | ORAL | 0 refills | Status: AC

## 2024-03-14 MED ORDER — ALBUTEROL SULFATE HFA 108 (90 BASE) MCG/ACT IN AERS: 2.0000 | INHALATION_SPRAY | Freq: Four times a day (QID) | RESPIRATORY_TRACT | 0 refills | Status: AC | PRN

## 2024-03-14 NOTE — Patient Instructions (Signed)
 John [name], thank you for joining John Velma Lunger, John [name] for today's virtual visit.  While this provider is not your primary care provider (PCP), if your PCP is located in our provider database this encounter information will be shared with them immediately following your visit.   A [facility] MyChart account gives you access to today's visit and all your visits, tests, and labs performed at [facility]  click here if you don't have a [facility] MyChart account or go to [email or web address]  Consent: (Patient) John [name] provided verbal consent for this virtual visit at the beginning of the encounter.  Current Medications:  Current Outpatient Medications:    Accu-Chek Softclix Lancets lancets, USE CHECK GLUCOSE ONCE TO TWICE DAILY, Disp: 100 each, Rfl: 3   amLODipine  (NORVASC ) 10 MG tablet, Take 1 tablet (10 mg total) by mouth at bedtime., Disp: 90 tablet, Rfl: 3   azelastine  (ASTELIN ) 0.1 % nasal spray, Place 2 sprays into both nostrils 2 (two) times daily. Use in each nostril as directed, Disp: 30 mL, Rfl: 0   azithromycin  (ZITHROMAX ) 250 MG tablet, Take 2 tablets on day 1, then 1 tablet daily on days 2 through 5, Disp: 6 tablet, Rfl: 0   baclofen  (LIORESAL ) 10 MG tablet, Take 1 tablet (10 mg total) by mouth 2 (two) times daily as needed for muscle spasms., Disp: 20 each, Rfl: 0   BD PEN NEEDLE NANO 2ND GEN 32G X 4 MM MISC, USE NEEDLE WITH INSULIN  PEN TO INJECT YOUR INSULIN  AS DIRECTED, Disp: 100 each, Rfl: 3   Blood Glucose Monitoring Suppl (ACCU-CHEK GUIDE) w/Device KIT, Use to check blood sugars 1-2 times daily., Disp: 1 kit, Rfl: 0   Chlorphen-PE-Acetaminophen  4-10-325 MG TABS, Take 1 tablet by mouth in the morning, at noon, and at bedtime., Disp: 21 tablet, Rfl: 0   cyclobenzaprine  (FLEXERIL ) 10 MG tablet, Take 1 tablet (10 mg total) by mouth at bedtime., Disp: 10 tablet, Rfl: 0   diclofenac  Sodium (VOLTAREN  ARTHRITIS PAIN) 1 % GEL, Apply 4 g topically 4  (four) times daily., Disp: 4 g, Rfl: 0   Elastic Bandages & Supports (WRIST SPLINT/ELASTIC RIGHT XL) MISC, 1 Application by Does not apply route daily., Disp: 1 each, Rfl: 0   fluticasone  (FLONASE ) 50 MCG/ACT nasal spray, Place 2 sprays into both nostrils daily., Disp: 16 g, Rfl: 6   gabapentin  (NEURONTIN ) 100 MG capsule, Take 1 capsule (100 mg total) by mouth 3 (three) times daily., Disp: 90 capsule, Rfl: 1   glucose blood (ACCU-CHEK GUIDE TEST) test strip, Use to check blood sugar once to twice daily, Disp: 100 each, Rfl: 3   JARDIANCE  10 MG TABS tablet, TAKE 1 TABLET BY MOUTH ONCE DAILY BEFORE BREAKFAST, Disp: 30 tablet, Rfl: 0   LANTUS  SOLOSTAR 100 UNIT/ML Solostar Pen, Inject 25 Units into the skin at bedtime., Disp: 30 mL, Rfl: 1   lidocaine  (LIDODERM ) 5 %, Place 1 patch onto the skin daily. Remove & Discard patch within 12 hours or as directed by MD, Disp: 30 patch, Rfl: 0   losartan -hydrochlorothiazide  (HYZAAR) 50-12.5 MG tablet, Take 1 tablet by mouth daily., Disp: 30 tablet, Rfl: 11   meclizine  (ANTIVERT ) 25 MG tablet, Take 1 tablet (25 mg total) by mouth 3 (three) times daily as needed for dizziness., Disp: 30 tablet, Rfl: 0   methocarbamol  (ROBAXIN ) 500 MG tablet, Take 1 tablet (500 mg total) by mouth every morning., Disp: 10 tablet, Rfl: 0   omeprazole  (PRILOSEC) 20 MG capsule, Take  1 capsule (20 mg total) by mouth daily before breakfast., Disp: 30 capsule, Rfl: 0   ondansetron  (ZOFRAN -ODT) 4 MG disintegrating tablet, Take 1 tablet (4 mg total) by mouth every 8 (eight) hours as needed for nausea or vomiting., Disp: 20 tablet, Rfl: 0   rosuvastatin  (CRESTOR ) 20 MG tablet, Take 1 tablet by mouth once daily, Disp: 90 tablet, Rfl: 0   Semaglutide , 1 MG/DOSE, 4 MG/3ML SOPN, Inject 1 mg into the skin once a week., Disp: 3 mL, Rfl: 3   Medications ordered in this encounter:  No orders of the defined types were placed in this encounter.    *If you need refills on other medications prior to  your next appointment, please contact your pharmacy*  Follow-Up: Call back or seek an in-person evaluation if the symptoms worsen or if the condition fails to improve as anticipated.  [facility] [phone]  Other Instructions Hydrate and rest. Ok to continue cough medication. Start the albuterol  as directed for coughing spells and wheezing. Ok to continue Mucinex OTC to thin any lingering congestion. You can take an OTC non-drowsy Claritin for a few days as well -- avoid long-term use in this case. If you note any non-resolving, new, or worsening symptoms despite treatment, please seek an in-person evaluation ASAP.     If you have been instructed to have an in-person evaluation today at a local Urgent Care facility, please use the link below. It will take you to a list of all of our available [facility], including address, phone number and hours of operation. Please do not delay care.  [facility]  If you or a family member do not have a primary care provider, use the link below to schedule a visit and establish care. When you choose a [place] primary care physician or advanced practice provider, you gain a long-term partner in health. Find a Primary Care Provider  Learn more about [place]'s in-office and virtual care options: [place] - Get Care Now

## 2024-03-14 NOTE — Progress Notes (Signed)
 Virtual Visit Consent   John [name], you are scheduled for a virtual visit with a [facility] provider today. Just as with appointments in the office, your consent must be obtained to participate. Your consent will be active for this visit and any virtual visit you may have with one of our providers in the next 365 days. If you have a MyChart account, a copy of this consent can be sent to you electronically.  As this is a virtual visit, video technology does not allow for your provider to perform a traditional examination. This may limit your provider's ability to fully assess your condition. If your provider identifies any concerns that need to be evaluated in person or the need to arrange testing (such as labs, EKG, etc.), we will make arrangements to do so. Although advances in technology are sophisticated, we cannot ensure that it will always work on either your end or our end. If the connection with a video visit is poor, the visit may have to be switched to a telephone visit. With either a video or telephone visit, we are not always able to ensure that we have a secure connection.  By engaging in this virtual visit, you consent to the provision of healthcare and authorize for your insurance to be billed (if applicable) for the services provided during this visit. Depending on your insurance coverage, you may receive a charge related to this service.  I need to obtain your verbal consent now. Are you willing to proceed with your visit today? John [name] has provided verbal consent on 03/14/2024 for a virtual visit (video or telephone). [name], [place]  Date: 03/14/2024 7:35 PM   Virtual Visit via Video Note   I, [name], connected with  John [name]  ([id number], Nov 03, 1965) on 03/14/24 at  7:45 PM EDT by a video-enabled telemedicine application and verified that I am speaking with the correct person using two identifiers.  Location: Patient: Virtual Visit Location  Patient: Home Provider: Virtual Visit Location Provider: Home Office   I discussed the limitations of evaluation and management by telemedicine and the availability of in person appointments. The patient expressed understanding and agreed to proceed.    History of Present Illness: John [name] is a 58 y.o. who identifies as a male who was assigned male at birth, and is being seen today for follow-up from visit with this provider on 8/28 for acute bacterial bronchitis. Notes completing course of Azithromycin  with substantial improvement in cough and congestion itself, but still having some mild residual cough with chest tightness an wheezing. Denies chest pain. Denies fever, chills. Notes at end of antibiotic course, started having mild itching in his ears and nose.    HPI: HPI  Problems:  Patient Active Problem List   Diagnosis Date Noted   Dizziness 02/08/2024   Shoulder pain, bilateral 01/11/2023   BPPV (benign paroxysmal positional vertigo) 01/11/2023   Left cervical radiculopathy 09/29/2022   Healthcare maintenance 12/03/2021   Erectile dysfunction 02/28/2020   Hyperlipidemia associated with type 2 diabetes mellitus (HCC) 10/18/2019   Type 2 diabetes mellitus with unspecified complications (HCC) 07/26/2019   Schizophrenia (HCC) 07/25/2019   Anxiety disorder 07/25/2019   OSA (obstructive sleep apnea) 08/17/2013   Morbid obesity (HCC) 08/17/2013   Hypertension 08/17/2013    Allergies:  Allergies  Allergen Reactions   Azithromycin  Itching   Medications:  Current Outpatient Medications:    albuterol  (VENTOLIN  HFA) 108 (90 Base) MCG/ACT inhaler, Inhale 2  puffs into the lungs every 6 (six) hours as needed for wheezing or shortness of breath., Disp: 8 g, Rfl: 0   predniSONE  (DELTASONE ) 20 MG tablet, Take 2 tablets (40 mg total) by mouth daily with breakfast., Disp: 10 tablet, Rfl: 0   Accu-Chek Softclix Lancets lancets, USE CHECK GLUCOSE ONCE TO TWICE DAILY, Disp: 100 each, Rfl: 3    amLODipine  (NORVASC ) 10 MG tablet, Take 1 tablet (10 mg total) by mouth at bedtime., Disp: 90 tablet, Rfl: 3   azelastine  (ASTELIN ) 0.1 % nasal spray, Place 2 sprays into both nostrils 2 (two) times daily. Use in each nostril as directed, Disp: 30 mL, Rfl: 0   baclofen  (LIORESAL ) 10 MG tablet, Take 1 tablet (10 mg total) by mouth 2 (two) times daily as needed for muscle spasms., Disp: 20 each, Rfl: 0   BD PEN NEEDLE NANO 2ND GEN 32G X 4 MM MISC, USE NEEDLE WITH INSULIN  PEN TO INJECT YOUR INSULIN  AS DIRECTED, Disp: 100 each, Rfl: 3   Blood Glucose Monitoring Suppl (ACCU-CHEK GUIDE) w/Device KIT, Use to check blood sugars 1-2 times daily., Disp: 1 kit, Rfl: 0   Chlorphen-PE-Acetaminophen  4-10-325 MG TABS, Take 1 tablet by mouth in the morning, at noon, and at bedtime., Disp: 21 tablet, Rfl: 0   cyclobenzaprine  (FLEXERIL ) 10 MG tablet, Take 1 tablet (10 mg total) by mouth at bedtime., Disp: 10 tablet, Rfl: 0   diclofenac  Sodium (VOLTAREN  ARTHRITIS PAIN) 1 % GEL, Apply 4 g topically 4 (four) times daily., Disp: 4 g, Rfl: 0   Elastic Bandages & Supports (WRIST SPLINT/ELASTIC RIGHT XL) MISC, 1 Application by Does not apply route daily., Disp: 1 each, Rfl: 0   fluticasone  (FLONASE ) 50 MCG/ACT nasal spray, Place 2 sprays into both nostrils daily., Disp: 16 g, Rfl: 6   gabapentin  (NEURONTIN ) 100 MG capsule, Take 1 capsule (100 mg total) by mouth 3 (three) times daily., Disp: 90 capsule, Rfl: 1   glucose blood (ACCU-CHEK GUIDE TEST) test strip, Use to check blood sugar once to twice daily, Disp: 100 each, Rfl: 3   JARDIANCE  10 MG TABS tablet, TAKE 1 TABLET BY MOUTH ONCE DAILY BEFORE BREAKFAST, Disp: 30 tablet, Rfl: 0   LANTUS  SOLOSTAR 100 UNIT/ML Solostar Pen, Inject 25 Units into the skin at bedtime., Disp: 30 mL, Rfl: 1   lidocaine  (LIDODERM ) 5 %, Place 1 patch onto the skin daily. Remove & Discard patch within 12 hours or as directed by MD, Disp: 30 patch, Rfl: 0   losartan -hydrochlorothiazide  (HYZAAR)  50-12.5 MG tablet, Take 1 tablet by mouth daily., Disp: 30 tablet, Rfl: 11   meclizine  (ANTIVERT ) 25 MG tablet, Take 1 tablet (25 mg total) by mouth 3 (three) times daily as needed for dizziness., Disp: 30 tablet, Rfl: 0   methocarbamol  (ROBAXIN ) 500 MG tablet, Take 1 tablet (500 mg total) by mouth every morning., Disp: 10 tablet, Rfl: 0   omeprazole  (PRILOSEC) 20 MG capsule, Take 1 capsule (20 mg total) by mouth daily before breakfast., Disp: 30 capsule, Rfl: 0   ondansetron  (ZOFRAN -ODT) 4 MG disintegrating tablet, Take 1 tablet (4 mg total) by mouth every 8 (eight) hours as needed for nausea or vomiting., Disp: 20 tablet, Rfl: 0   rosuvastatin  (CRESTOR ) 20 MG tablet, Take 1 tablet by mouth once daily, Disp: 90 tablet, Rfl: 0   Semaglutide , 1 MG/DOSE, 4 MG/3ML SOPN, Inject 1 mg into the skin once a week., Disp: 3 mL, Rfl: 3  Observations/Objective: Patient is well-developed, well-nourished in no acute  distress.  Resting comfortably  at home.  Head is normocephalic, atraumatic.  No labored breathing.  Speech is clear and coherent with logical content.  Patient is alert and oriented at baseline.    Assessment and Plan: 1. Bronchospasm (Primary) - albuterol  (VENTOLIN  HFA) 108 (90 Base) MCG/ACT inhaler; Inhale 2 puffs into the lungs every 6 (six) hours as needed for wheezing or shortness of breath.  Dispense: 8 g; Refill: 0  2. Wheezing - albuterol  (VENTOLIN  HFA) 108 (90 Base) MCG/ACT inhaler; Inhale 2 puffs into the lungs every 6 (six) hours as needed for wheezing or shortness of breath.  Dispense: 8 g; Refill: 0 - predniSONE  (DELTASONE ) 20 MG tablet; Take 2 tablets (40 mg total) by mouth daily with breakfast.  Dispense: 10 tablet; Refill: 0  Suspect post infections bronchospasm contributing to residual cough and wheezing. Supportive measures and OTC medications reviewed. Start Albuterol  and prednisone  per orders. Added Azithromycin  to allergy list giving mild itching that occurred while  taking.   Follow Up Instructions: I discussed the assessment and treatment plan with the patient. The patient was provided an opportunity to ask questions and all were answered. The patient agreed with the plan and demonstrated an understanding of the instructions.  A copy of instructions were sent to the patient via MyChart unless otherwise noted below.   The patient was advised to call back or seek an in-person evaluation if the symptoms worsen or if the condition fails to improve as anticipated.    [name], PA-C

## 2024-03-16 ENCOUNTER — Other Ambulatory Visit: Payer: Self-pay

## 2024-03-16 DIAGNOSIS — E1169 Type 2 diabetes mellitus with other specified complication: Secondary | ICD-10-CM

## 2024-03-16 MED ORDER — ROSUVASTATIN CALCIUM 20 MG PO TABS: 20.0000 mg | ORAL_TABLET | Freq: Every day | ORAL | 3 refills | Status: AC

## 2024-03-16 NOTE — Progress Notes (Signed)
 This patient is appearing on a report for being at risk of failing the adherence measure for cholesterol (statin) medications this calendar year.   Medication: rosuvastatin  20 mg daily Last fill date: 11/25/23 for 90 day supply - 0 refills remaining  Outreached patient to discuss importance of medication adherence to rosuvastatin . He reports that he still has a supply remaining of this medication, but he will fill when he runs out. Will collaborate with provider to order refills. Patient is due for repeat lipid panel (LDL-C 129 mg/dL in July 7975)  Patient reports that he feels that his losartan -hydrochlorothiazide  50-12.5 mg daily is making him jittery and toss and turn overnight. He reports that he has not taken it in several days. He usually takes the medication at night. Appears patient was transitioned to this medication from losartan  alone ~01/10/24. Advised him to try taking it in the morning. Patient checked BP while on the phone - initially 134/81 mmHg HR 101, repeat 129/82 mmHg, HR 105, which are both close to goal < 130/80 mmHg despite not taking losartan -hydrochlorothiazide  for several days. If side effects persist, consider switching back to losartan  50 mg daily, or could increase to 100 mg daily if additional antihypertensive control is needed.   Assisted patient in scheduling [facility] follow-up with Dr. Jolaine on 04/19/24. Will continue to follow for adherence.   Lorain Baseman, PharmD [facility] [phone]

## 2024-03-17 ENCOUNTER — Telehealth: Admitting: Family Medicine

## 2024-03-17 ENCOUNTER — Telehealth

## 2024-03-17 DIAGNOSIS — F419 Anxiety disorder, unspecified: Secondary | ICD-10-CM

## 2024-03-17 DIAGNOSIS — G4700 Insomnia, unspecified: Secondary | ICD-10-CM

## 2024-03-17 MED ORDER — HYDROXYZINE PAMOATE 25 MG PO CAPS: 25.0000 mg | ORAL_CAPSULE | Freq: Three times a day (TID) | ORAL | 0 refills | Status: AC | PRN

## 2024-03-17 NOTE — Progress Notes (Signed)
 Virtual Visit Consent   John [name], you are scheduled for a virtual visit with a [facility] provider today. Just as with appointments in the office, your consent must be obtained to participate. Your consent will be active for this visit and any virtual visit you may have with one of our providers in the next 365 days. If you have a MyChart account, a copy of this consent can be sent to you electronically.  As this is a virtual visit, video technology does not allow for your provider to perform a traditional examination. This may limit your provider's ability to fully assess your condition. If your provider identifies any concerns that need to be evaluated in person or the need to arrange testing (such as labs, EKG, etc.), we will make arrangements to do so. Although advances in technology are sophisticated, we cannot ensure that it will always work on either your end or our end. If the connection with a video visit is poor, the visit may have to be switched to a telephone visit. With either a video or telephone visit, we are not always able to ensure that we have a secure connection.  By engaging in this virtual visit, you consent to the provision of healthcare and authorize for your insurance to be billed (if applicable) for the services provided during this visit. Depending on your insurance coverage, you may receive a charge related to this service.  I need to obtain your verbal consent now. Are you willing to proceed with your visit today? John [name] has provided verbal consent on 03/17/2024 for a virtual visit (video or telephone). John Lamp, FNP  Date: 03/17/2024 10:22 AM   Virtual Visit via Video Note   I, John [name], connected with  John [name]  ([id number], Sep 11, 1965) on 03/17/24 at 10:15 AM EDT by a video-enabled telemedicine application and verified that I am speaking with the correct person using two identifiers.  Location: Patient: Virtual Visit Location Patient:  Home Provider: Virtual Visit Location Provider: Home Office   I discussed the limitations of evaluation and management by telemedicine and the availability of in person appointments. The patient expressed understanding and agreed to proceed.    History of Present Illness: John [name] is a 58 y.o. who identifies as a male who was assigned male at birth, and is being seen today for anxiety, mind racing at night. He was previously on a benzo to help him sleep. He would like that back but understands we can not prescribe controlled meds. [facility]  HPI: HPI  Problems:  Patient Active Problem List   Diagnosis Date Noted   Dizziness 02/08/2024   Shoulder pain, bilateral 01/11/2023   BPPV (benign paroxysmal positional vertigo) 01/11/2023   Left cervical radiculopathy 09/29/2022   Healthcare maintenance 12/03/2021   Erectile dysfunction 02/28/2020   Hyperlipidemia associated with type 2 diabetes mellitus (HCC) 10/18/2019   Type 2 diabetes mellitus with unspecified complications (HCC) 07/26/2019   Schizophrenia (HCC) 07/25/2019   Anxiety disorder 07/25/2019   OSA (obstructive sleep apnea) 08/17/2013   Morbid obesity (HCC) 08/17/2013   Hypertension 08/17/2013    Allergies:  Allergies  Allergen Reactions   Azithromycin  Itching   Medications:  Current Outpatient Medications:    hydrOXYzine  (VISTARIL ) 25 MG capsule, Take 1 capsule (25 mg total) by mouth every 8 (eight) hours as needed for anxiety., Disp: 30 capsule, Rfl: 0   Accu-Chek Softclix Lancets lancets, USE CHECK GLUCOSE ONCE TO TWICE DAILY, Disp: 100 each, Rfl: 3  albuterol  (VENTOLIN  HFA) 108 (90 Base) MCG/ACT inhaler, Inhale 2 puffs into the lungs every 6 (six) hours as needed for wheezing or shortness of breath., Disp: 8 g, Rfl: 0   amLODipine  (NORVASC ) 10 MG tablet, Take 1 tablet (10 mg total) by mouth at bedtime., Disp: 90 tablet, Rfl: 3   azelastine  (ASTELIN ) 0.1 % nasal spray, Place 2 sprays into both nostrils 2 (two) times daily.  Use in each nostril as directed, Disp: 30 mL, Rfl: 0   baclofen  (LIORESAL ) 10 MG tablet, Take 1 tablet (10 mg total) by mouth 2 (two) times daily as needed for muscle spasms., Disp: 20 each, Rfl: 0   BD PEN NEEDLE NANO 2ND GEN 32G X 4 MM MISC, USE NEEDLE WITH INSULIN  PEN TO INJECT YOUR INSULIN  AS DIRECTED, Disp: 100 each, Rfl: 3   Blood Glucose Monitoring Suppl (ACCU-CHEK GUIDE) w/Device KIT, Use to check blood sugars 1-2 times daily., Disp: 1 kit, Rfl: 0   Chlorphen-PE-Acetaminophen  4-10-325 MG TABS, Take 1 tablet by mouth in the morning, at noon, and at bedtime., Disp: 21 tablet, Rfl: 0   cyclobenzaprine  (FLEXERIL ) 10 MG tablet, Take 1 tablet (10 mg total) by mouth at bedtime., Disp: 10 tablet, Rfl: 0   diclofenac  Sodium (VOLTAREN  ARTHRITIS PAIN) 1 % GEL, Apply 4 g topically 4 (four) times daily., Disp: 4 g, Rfl: 0   Elastic Bandages & Supports (WRIST SPLINT/ELASTIC RIGHT XL) MISC, 1 Application by Does not apply route daily., Disp: 1 each, Rfl: 0   fluticasone  (FLONASE ) 50 MCG/ACT nasal spray, Place 2 sprays into both nostrils daily., Disp: 16 g, Rfl: 6   gabapentin  (NEURONTIN ) 100 MG capsule, Take 1 capsule (100 mg total) by mouth 3 (three) times daily., Disp: 90 capsule, Rfl: 1   glucose blood (ACCU-CHEK GUIDE TEST) test strip, Use to check blood sugar once to twice daily, Disp: 100 each, Rfl: 3   JARDIANCE  10 MG TABS tablet, TAKE 1 TABLET BY MOUTH ONCE DAILY BEFORE BREAKFAST, Disp: 30 tablet, Rfl: 0   LANTUS  SOLOSTAR 100 UNIT/ML Solostar Pen, Inject 25 Units into the skin at bedtime., Disp: 30 mL, Rfl: 1   lidocaine  (LIDODERM ) 5 %, Place 1 patch onto the skin daily. Remove & Discard patch within 12 hours or as directed by MD, Disp: 30 patch, Rfl: 0   losartan -hydrochlorothiazide  (HYZAAR) 50-12.5 MG tablet, Take 1 tablet by mouth daily., Disp: 30 tablet, Rfl: 11   meclizine  (ANTIVERT ) 25 MG tablet, Take 1 tablet (25 mg total) by mouth 3 (three) times daily as needed for dizziness., Disp: 30  tablet, Rfl: 0   methocarbamol  (ROBAXIN ) 500 MG tablet, Take 1 tablet (500 mg total) by mouth every morning., Disp: 10 tablet, Rfl: 0   omeprazole  (PRILOSEC) 20 MG capsule, Take 1 capsule (20 mg total) by mouth daily before breakfast., Disp: 30 capsule, Rfl: 0   ondansetron  (ZOFRAN -ODT) 4 MG disintegrating tablet, Take 1 tablet (4 mg total) by mouth every 8 (eight) hours as needed for nausea or vomiting., Disp: 20 tablet, Rfl: 0   predniSONE  (DELTASONE ) 20 MG tablet, Take 2 tablets (40 mg total) by mouth daily with breakfast., Disp: 10 tablet, Rfl: 0   rosuvastatin  (CRESTOR ) 20 MG tablet, Take 1 tablet (20 mg total) by mouth daily., Disp: 90 tablet, Rfl: 3   Semaglutide , 1 MG/DOSE, 4 MG/3ML SOPN, Inject 1 mg into the skin once a week., Disp: 3 mL, Rfl: 3  Observations/Objective: Patient is well-developed, well-nourished in no acute distress.  Resting comfortably  at  home.  Head is normocephalic, atraumatic.  No labored breathing.  Speech is clear and coherent with logical content.  Patient is alert and oriented at baseline.   Assessment and Plan: 1. Insomnia, unspecified type (Primary)  2. Anxiety disorder, unspecified type  Follow up with pcp as needed.   Follow Up Instructions: I discussed the assessment and treatment plan with the patient. The patient was provided an opportunity to ask questions and all were answered. The patient agreed with the plan and demonstrated an understanding of the instructions.  A copy of instructions were sent to the patient via MyChart unless otherwise noted below.     The patient was advised to call back or seek an in-person evaluation if the symptoms worsen or if the condition fails to improve as anticipated.    Blease Capaldi, FNP

## 2024-03-17 NOTE — Patient Instructions (Signed)
 Insomnia Insomnia is a sleep disorder that makes it difficult to fall asleep or stay asleep. Insomnia can cause fatigue, low energy, difficulty concentrating, mood swings, and poor performance at work or school. There are three different ways to classify insomnia: Difficulty falling asleep. Difficulty staying asleep. Waking up too early in the morning. Any type of insomnia can be long-term (chronic) or short-term (acute). Both are common. Short-term insomnia usually lasts for 3 months or less. Chronic insomnia occurs at least three times a week for longer than 3 months. What are the causes? Insomnia may be caused by another condition, situation, or substance, such as: Having certain mental health conditions, such as anxiety and depression. Using caffeine, alcohol , tobacco, or drugs. Having gastrointestinal conditions, such as gastroesophageal reflux disease (GERD). Having certain medical conditions. These include: Asthma. Alzheimer's disease. Stroke. Chronic pain. An overactive thyroid  gland (hyperthyroidism). Other sleep disorders, such as restless legs syndrome and sleep apnea. Menopause. Sometimes, the cause of insomnia may not be known. What increases the risk? Risk factors for insomnia include: Gender. Females are affected more often than males. Age. Insomnia is more common as people get older. Stress and certain medical and mental health conditions. Lack of exercise. Having an irregular work schedule. This may include working night shifts and traveling between different time zones. What are the signs or symptoms? If you have insomnia, the main symptom is having trouble falling asleep or having trouble staying asleep. This may lead to other symptoms, such as: Feeling tired or having low energy. Feeling nervous about going to sleep. Not feeling rested in the morning. Having trouble concentrating. Feeling irritable, anxious, or depressed. How is this diagnosed? This condition  may be diagnosed based on: Your symptoms and medical history. Your health care provider may ask about: Your sleep habits. Any medical conditions you have. Your mental health. A physical exam. How is this treated? Treatment for insomnia depends on the cause. Treatment may focus on treating an underlying condition that is causing the insomnia. Treatment may also include: Medicines to help you sleep. Counseling or therapy. Lifestyle adjustments to help you sleep better. Follow these instructions at home: Eating and drinking  Limit or avoid alcohol , caffeinated beverages, and products that contain nicotine and tobacco, especially close to bedtime. These can disrupt your sleep. Do not eat a large meal or eat spicy foods right before bedtime. This can lead to digestive discomfort that can make it hard for you to sleep. Sleep habits  Keep a sleep diary to help you and your health care provider figure out what could be causing your insomnia. Write down: When you sleep. When you wake up during the night. How well you sleep and how rested you feel the next day. Any side effects of medicines you are taking. What you eat and drink. Make your bedroom a dark, comfortable place where it is easy to fall asleep. Put up shades or blackout curtains to block light from outside. Use a white noise machine to block noise. Keep the temperature cool. Limit screen use before bedtime. This includes: Not watching TV. Not using your smartphone, tablet, or computer. Stick to a routine that includes going to bed and waking up at the same times every day and night. This can help you fall asleep faster. Consider making a quiet activity, such as reading, part of your nighttime routine. Try to avoid taking naps during the day so that you sleep better at night. Get out of bed if you are still awake after  15 minutes of trying to sleep. Keep the lights down, but try reading or doing a quiet activity. When you feel  sleepy, go back to bed. General instructions Take over-the-counter and prescription medicines only as told by your health care provider. Exercise regularly as told by your health care provider. However, avoid exercising in the hours right before bedtime. Use relaxation techniques to manage stress. Ask your health care provider to suggest some techniques that may work well for you. These may include: Breathing exercises. Routines to release muscle tension. Visualizing peaceful scenes. Make sure that you drive carefully. Do not drive if you feel very sleepy. Keep all follow-up visits. This is important. Contact a health care provider if: You are tired throughout the day. You have trouble in your daily routine due to sleepiness. You continue to have sleep problems, or your sleep problems get worse. Get help right away if: You have thoughts about hurting yourself or someone else. Get help right away if you feel like you may hurt yourself or others, or have thoughts about taking your own life. Go to your nearest emergency room or: Call 911. Call the National Suicide Prevention Lifeline at 2232757840 or 988. This is open 24 hours a day. Text the Crisis Text Line at 657-529-4371. Summary Insomnia is a sleep disorder that makes it difficult to fall asleep or stay asleep. Insomnia can be long-term (chronic) or short-term (acute). Treatment for insomnia depends on the cause. Treatment may focus on treating an underlying condition that is causing the insomnia. Keep a sleep diary to help you and your health care provider figure out what could be causing your insomnia. This information is not intended to replace advice given to you by your health care provider. Make sure you discuss any questions you have with your health care provider. Document Revised: 06/09/2021 Document Reviewed: 06/09/2021 Elsevier Patient Education  2024 [employer].

## 2024-03-20 ENCOUNTER — Encounter (HOSPITAL_COMMUNITY): Payer: Self-pay

## 2024-03-20 ENCOUNTER — Ambulatory Visit: Payer: Self-pay

## 2024-03-20 ENCOUNTER — Ambulatory Visit (HOSPITAL_COMMUNITY)
Admission: EM | Admit: 2024-03-20 | Discharge: 2024-03-20 | Disposition: A | Discharge status: Home / Self Care | Attending: Physician Assistant | Admitting: Physician Assistant

## 2024-03-20 ENCOUNTER — Ambulatory Visit

## 2024-03-20 DIAGNOSIS — M545 Low back pain, unspecified: Secondary | ICD-10-CM | POA: Diagnosis not present

## 2024-03-20 MED ORDER — KETOROLAC TROMETHAMINE 30 MG/ML IJ SOLN
INTRAMUSCULAR | Status: AC
  Filled 2024-03-20: qty 1

## 2024-03-20 MED ORDER — METHOCARBAMOL 500 MG PO TABS: 500.0000 mg | ORAL_TABLET | Freq: Four times a day (QID) | ORAL | 0 refills | Status: AC

## 2024-03-20 MED ORDER — KETOROLAC TROMETHAMINE 60 MG/2ML IM SOLN
30.0000 mg | Freq: Once | INTRAMUSCULAR | Status: AC
  Administered 2024-03-20: 30 mg via INTRAMUSCULAR

## 2024-03-20 NOTE — Patient Instructions (Incomplete)
 Thank you, Mr.Josealberto D Riche for allowing us  to provide your care today. Today we discussed ***.    Referrals: -  New medications: -  I have ordered the following labs for you:  Lab Orders  No laboratory test(s) ordered today     I will call if any are abnormal. All of your labs can be accessed through My Chart.   My Chart Access: [email or web address]  Please follow-up in:    We look forward to seeing you next time. Please call our clinic at [phone] if you have any questions or concerns. The best time to call is Monday-Friday from 9am-4pm, but there is someone available 24/7. If after hours or the weekend, call the main hospital number and ask for the Internal Medicine Resident On-Call. If you need medication refills, please notify your pharmacy one week in advance and they will send us  a request.   Thank you for letting us  take part in your care. Wishing you the best!  Claire Bridge, DO 03/20/2024, 8:01 AM [facility]

## 2024-03-20 NOTE — ED Provider Notes (Signed)
 [facility]    CSN: [id number] Arrival date & time: 03/20/24  1444      History   Chief Complaint Chief Complaint  Patient presents with   Back Pain    HPI John [name] is a 58 y.o. male.   Patient lobe pain.  Patient reports that he began having the pain 3 days ago.  Patient states he has been taking Tylenol .  Patient states that he used his albuterol  inhaler last night.  Patient reports he has felt shaky since he used the medication.  Patient reports he recently has been experiencing a lot of anxiety.  Patient reports he has not had a fever he has not had any chills.  Patient is requesting a shot of Toradol  to help with his back pain.  Patient denies any nausea or vomiting.  He has not had any fever or chills.  Patient denies any cough  The history is provided by the patient. No language interpreter was used.  Back Pain Location:  Lumbar spine   Past Medical History:  Diagnosis Date   Anxiety disorder    Erectile dysfunction    Hyperlipidemia    Hypertension    Obesity, morbid, BMI 50 or higher (HCC)    Poorly controlled type 2 diabetes mellitus (HCC)    Schizophrenia (HCC)    Sleep apnea     Patient Active Problem List   Diagnosis Date Noted   Dizziness 02/08/2024   Shoulder pain, bilateral 01/11/2023   BPPV (benign paroxysmal positional vertigo) 01/11/2023   Left cervical radiculopathy 09/29/2022   Healthcare maintenance 12/03/2021   Erectile dysfunction 02/28/2020   Hyperlipidemia associated with type 2 diabetes mellitus (HCC) 10/18/2019   Type 2 diabetes mellitus with unspecified complications (HCC) 07/26/2019   Schizophrenia (HCC) 07/25/2019   Anxiety disorder 07/25/2019   OSA (obstructive sleep apnea) 08/17/2013   Morbid obesity (HCC) 08/17/2013   Hypertension 08/17/2013    Past Surgical History:  Procedure Laterality Date   ANKLE SURGERY     APPENDECTOMY         Home Medications    Prior to Admission medications   Medication Sig  Start Date End Date Taking? Authorizing Provider  methocarbamol  (ROBAXIN ) 500 MG tablet Take 1 tablet (500 mg total) by mouth 4 (four) times daily. 03/20/24  Yes Flint Raring K, PA-C  Accu-Chek Softclix Lancets lancets USE CHECK GLUCOSE ONCE TO TWICE DAILY 08/26/23   Alexander-Savino, Washington, MD  albuterol  (VENTOLIN  HFA) 108 (90 Base) MCG/ACT inhaler Inhale 2 puffs into the lungs every 6 (six) hours as needed for wheezing or shortness of breath. 03/14/24   Gladis Elsie BROCKS, PA-C  amLODipine  (NORVASC ) 10 MG tablet Take 1 tablet (10 mg total) by mouth at bedtime. 08/26/23   Alexander-Savino, [place], MD  azelastine  (ASTELIN ) 0.1 % nasal spray Place 2 sprays into both nostrils 2 (two) times daily. Use in each nostril as directed 02/03/24   Johnie Flaming A, NP  BD PEN NEEDLE NANO 2ND GEN 32G X 4 MM MISC USE NEEDLE WITH INSULIN  PEN TO INJECT YOUR INSULIN  AS DIRECTED 08/26/23   Alexander-Savino, [place], MD  Blood Glucose Monitoring Suppl (ACCU-CHEK GUIDE) w/Device KIT Use to check blood sugars 1-2 times daily. 03/26/23   Fernand Prost, MD  Chlorphen-PE-Acetaminophen  4-10-325 MG TABS Take 1 tablet by mouth in the morning, at noon, and at bedtime. 02/21/24   Iola Lukes, FNP  Elastic Bandages & Supports (WRIST SPLINT/ELASTIC RIGHT XL) MISC 1 Application by Does not apply route daily. 08/26/23  Alexander-Savino, [place], MD  fluticasone  (FLONASE ) 50 MCG/ACT nasal spray Place 2 sprays into both nostrils daily. 12/12/23   Lavell Lye A, FNP  gabapentin  (NEURONTIN ) 100 MG capsule Take 1 capsule (100 mg total) by mouth 3 (three) times daily. 09/01/23 11/30/23  Elnora Ip, MD  glucose blood (ACCU-CHEK GUIDE TEST) test strip Use to check blood sugar once to twice daily 12/23/23   Gregary Sharper, MD  hydrOXYzine  (VISTARIL ) 25 MG capsule Take 1 capsule (25 mg total) by mouth every 8 (eight) hours as needed for anxiety. 03/17/24 04/16/24  Blair, Diane W, FNP  JARDIANCE  10 MG TABS tablet TAKE 1 TABLET BY  MOUTH ONCE DAILY BEFORE BREAKFAST 02/28/24   Marylu Gee, DO  LANTUS  SOLOSTAR 100 UNIT/ML Solostar Pen Inject 25 Units into the skin at bedtime. 01/04/24   Jolaine Pac, DO  losartan -hydrochlorothiazide  (HYZAAR) 50-12.5 MG tablet Take 1 tablet by mouth daily. 01/10/24 01/09/25  Amoako, Prince, MD  meclizine  (ANTIVERT ) 25 MG tablet Take 1 tablet (25 mg total) by mouth 3 (three) times daily as needed for dizziness. 02/07/24   Marylu Gee, DO  omeprazole  (PRILOSEC) 20 MG capsule Take 1 capsule (20 mg total) by mouth daily before breakfast. 10/30/23   Fleming, Zelda W, NP  ondansetron  (ZOFRAN -ODT) 4 MG disintegrating tablet Take 1 tablet (4 mg total) by mouth every 8 (eight) hours as needed for nausea or vomiting. 12/28/23   Reddick, Johnathan B, NP  predniSONE  (DELTASONE ) 20 MG tablet Take 2 tablets (40 mg total) by mouth daily with breakfast. 03/14/24   Gladis Elsie BROCKS, PA-C  rosuvastatin  (CRESTOR ) 20 MG tablet Take 1 tablet (20 mg total) by mouth daily. 03/16/24   Marylu Gee, DO  Semaglutide , 1 MG/DOSE, 4 MG/3ML SOPN Inject 1 mg into the skin once a week. 12/23/23   Gregary Sharper, MD    Family History Family History  Problem Relation Age of Onset   Breast cancer Mother    Alcohol abuse Father    Hypertension Sister    Breast cancer Sister    Hypertension Brother    Diabetes type II Brother    Colon cancer Paternal Uncle        possible but not sure   Colon polyps Neg Hx    Rectal cancer Neg Hx    Stomach cancer Neg Hx     Social History Social History   Tobacco Use   Smoking status: Never   Smokeless tobacco: Never  Vaping Use   Vaping status: Never Used  Substance Use Topics   Alcohol use: Not Currently    Comment: rare in the last two years, 1x in 2-3 months   Drug use: Not Currently    Types: Marijuana    Comment: Occasional     Allergies   Azithromycin    Review of Systems Review of Systems  Musculoskeletal:  Positive for back pain.  All other systems reviewed  and are negative.    Physical Exam Triage Vital Signs ED Triage Vitals  Encounter Vitals Group     BP 03/20/24 1722 (!) 149/93     Girls Systolic BP Percentile --      Girls Diastolic BP Percentile --      Boys Systolic BP Percentile --      Boys Diastolic BP Percentile --      Pulse Rate 03/20/24 1722 84     Resp 03/20/24 1722 16     Temp 03/20/24 1722 98.5 F (36.9 C)     Temp Source 03/20/24 1722  Oral     SpO2 03/20/24 1722 96 %     Weight --      Height --      Head Circumference --      Peak Flow --      Pain Score 03/20/24 1726 9     Pain Loc --      Pain Education --      Exclude from Growth Chart --    No data found.  Updated Vital Signs BP (!) 149/93 (BP Location: Left Arm)   Pulse 84   Temp 98.5 F (36.9 C) (Oral)   Resp 16   SpO2 96%   Visual Acuity Right Eye Distance:   Left Eye Distance:   Bilateral Distance:    Right Eye Near:   Left Eye Near:    Bilateral Near:     Physical Exam Vitals and nursing note reviewed.  Constitutional:      General: He is not in acute distress.    Appearance: He is well-developed.  HENT:     Head: Normocephalic and atraumatic.  Eyes:     Conjunctiva/sclera: Conjunctivae normal.  Cardiovascular:     Rate and Rhythm: Normal rate and regular rhythm.     Heart sounds: No murmur heard. Pulmonary:     Effort: Pulmonary effort is normal. No respiratory distress.     Breath sounds: Normal breath sounds.  Abdominal:     Palpations: Abdomen is soft.     Tenderness: There is no abdominal tenderness.  Musculoskeletal:        General: No swelling.  Skin:    General: Skin is warm and dry.     Capillary Refill: Capillary refill takes less than 2 seconds.  Neurological:     Mental Status: He is alert.  Psychiatric:        Mood and Affect: Mood normal.      UC Treatments / Results  Labs (all labs ordered are listed, but only abnormal results are displayed) Labs Reviewed - No data to  display  EKG   Radiology No results found.  Procedures Procedures (including critical care time)  Medications Ordered in UC Medications  ketorolac  (TORADOL ) injection 30 mg (30 mg Intramuscular Given 03/20/24 1826)    Initial Impression / Assessment and Plan / UC Course  I have reviewed the triage vital signs and the nursing notes.  Pertinent labs & imaging results that were available during my care of the patient were reviewed by me and considered in my medical decision making (see chart for details).     Patient given injection of Toradol .  I discussed patient's anxiety with him.  He was prescribed medication for anxiety 3 days ago.  Patient is advised to follow-up with his primary care physician for recheck.  Patient is advised he can also go to the behavioral health urgent care if he continues to experience anxiety. Final Clinical Impressions(s) / UC Diagnoses   Final diagnoses:  Acute right-sided low back pain without sciatica   Discharge Instructions   None    ED Prescriptions     Medication Sig Dispense Auth. Provider   methocarbamol  (ROBAXIN ) 500 MG tablet Take 1 tablet (500 mg total) by mouth 4 (four) times daily. 20 tablet Wrenna Saks K, PA-C      PDMP not reviewed this encounter. An After Visit Summary was printed and given to the patient.       Flint Sonny POUR, PA-C 03/20/24 2051

## 2024-03-20 NOTE — ED Triage Notes (Signed)
 Patient here today with c/o right side low back pain and right side rib area pain X 3 days. Patient has taken Tylenol  with no relief.   Patient also noticed that he had some tingling in the left side of his chest while he was sitting in the lobby. Patient states that he used an albuterol  last night and not sure if that has anything to do with it.

## 2024-03-20 NOTE — Patient Instructions (Incomplete)
 Thank you, [name] for allowing us  to provide your care today. Today we discussed a referral to get your colonoscopy done.    Referrals: - [facility] for your routine colonoscopy   New medications: -  I have ordered the following labs for you:  Lab Orders  No laboratory test(s) ordered today     I will call if any are abnormal. All of your labs can be accessed through My Chart.   My Chart Access: [email or web address]  Please follow-up in: 1 month for your A1c to be rechecked    We look forward to seeing you next time. Please call our clinic at [phone] if you have any questions or concerns. The best time to call is Monday-Friday from 9am-4pm, but there is someone available 24/7. If after hours or the weekend, call the main hospital number and ask for the Internal Medicine Resident On-Call. If you need medication refills, please notify your pharmacy one week in advance and they will send us  a request.   Thank you for letting us  take part in your care. Wishing you the best!  Brenee Gajda, DO 03/20/2024, 10:24 AM [facility]

## 2024-03-20 NOTE — Progress Notes (Deleted)
   Established Patient Office Visit  Subjective   Patient ID: John [name], male    DOB: [birth year]  Age: 58 y.o. MRN: [id number]  No chief complaint on file.   HPI  {History (Optional):23778}  ROS    Objective:     There were no vitals taken for this visit. Physical Exam   The 10-year ASCVD risk score (Arnett DK, et al., 2019) is: 24.2%    Assessment & Plan:   Patient seen with {IMTSattending2025/2026:32924}.   Problem List Items Addressed This Visit   None   No follow-ups on file.    Tonnette Zwiebel, DO Internal Medicine Resident, PGY-1 8:01 AM 03/20/2024

## 2024-03-20 NOTE — Progress Notes (Deleted)
   Established Patient Office Visit  Subjective   Patient ID: John [name], male    DOB: Oct 16, 1965  Age: 58 y.o. MRN: [id number]  No chief complaint on file.   John [name] is a 58 year old male with a past medical history of HTN, OSA, T2DM, HLD, anxiety, and schizophrenia who presents to the clinic today to request a colonoscopy. The patient has never had one done and would like to ensure he stays up-to-date with his healthcare maintenance. Please see problem-based assessment and plan below.    ROS    Objective:    There were no vitals taken for this visit. Physical Exam   The 10-year ASCVD risk score (Arnett DK, et al., 2019) is: 24.2%    Assessment & Plan:   Patient seen with {IMTSattending2025/2026:32924}.  Health Maintenance Patient made appointment because he would like a referral to GI for his first colonoscopy.     John Greenhouse, DO Internal Medicine Resident, PGY-1 10:20 AM 03/20/2024

## 2024-03-23 ENCOUNTER — Ambulatory Visit (INDEPENDENT_AMBULATORY_CARE_PROVIDER_SITE_OTHER): Admitting: Audiology

## 2024-03-23 ENCOUNTER — Encounter (INDEPENDENT_AMBULATORY_CARE_PROVIDER_SITE_OTHER): Payer: Self-pay | Admitting: Otolaryngology

## 2024-03-23 ENCOUNTER — Ambulatory Visit (INDEPENDENT_AMBULATORY_CARE_PROVIDER_SITE_OTHER): Admitting: Otolaryngology

## 2024-03-23 VITALS — BP 152/98 | HR 88 | Ht 67.0 in | Wt 327.0 lb

## 2024-03-23 DIAGNOSIS — J328 Other chronic sinusitis: Secondary | ICD-10-CM

## 2024-03-23 DIAGNOSIS — I10 Essential (primary) hypertension: Secondary | ICD-10-CM | POA: Diagnosis not present

## 2024-03-23 DIAGNOSIS — H903 Sensorineural hearing loss, bilateral: Secondary | ICD-10-CM

## 2024-03-23 DIAGNOSIS — R0981 Nasal congestion: Secondary | ICD-10-CM

## 2024-03-23 DIAGNOSIS — H8110 Benign paroxysmal vertigo, unspecified ear: Secondary | ICD-10-CM | POA: Diagnosis not present

## 2024-03-23 DIAGNOSIS — R2689 Other abnormalities of gait and mobility: Secondary | ICD-10-CM

## 2024-03-23 DIAGNOSIS — J343 Hypertrophy of nasal turbinates: Secondary | ICD-10-CM | POA: Diagnosis not present

## 2024-03-23 DIAGNOSIS — R448 Other symptoms and signs involving general sensations and perceptions: Secondary | ICD-10-CM | POA: Diagnosis not present

## 2024-03-23 MED ORDER — AZELASTINE HCL 0.1 % NA SOLN: 2.0000 | Freq: Every day | NASAL | 0 refills | Status: AC

## 2024-03-23 NOTE — Progress Notes (Signed)
 Dear Dr. Fernand, Here is my assessment for our mutual patient, John [name]. Thank you for allowing me the opportunity to care for your patient. Please do not hesitate to contact me should you have any other questions. Sincerely, Dr. Eldora Blanch  Otolaryngology Clinic Note Referring provider: Dr. Fernand HPI:  John [name] is a 58 y.o. male kindly referred by Dr. Fernand for evaluation of imbalance and sinonasal symptoms  Initial visit (03/2024):  He reports intermittent episodes of dizziness, reports going on for a while. He describes this as room spinning briefly, only happens when he lays on his right side. Comes and goes - lasts a few seconds and goes away. Occurs about once a month. He does the Epley which he found on Facebook and this seems to help. No aural fullness. Some headaches as well. No light or sound sensitivity. No fluctuating hearing. He has tried meclizine  - it seems to help. This is not happening currently.  He also appears to have separate instances of feeling off balance - this occurs almost every day and often happens when he tries to get up but gets better when he sits down.   He does have a mild headache today and has HTN - did not take his BP meds today.  He also notes episodes of sinus congestion and pressure which happen with frequent regularity. He has PND and was recently diagnosed with bronchitis for which he recently got antibiotics and steroids -- he tried use prednisone  which made him feel weird. No prior sinonasal surgery or allergy testing. He uses a Neti Pot when he feels like his sinuses are draining. He does not use any other sprays - has tried flonase  in the past but did not take it regularly because it makes him jittery.  Patient denies: ear pain, drainage Patient additionally denies: deep pain in ear canal, eustachian tube symptoms such as popping/crackling, sensitive to pressure changes Patient also denies barotrauma, ototoxic medication use Prior ear  surgery: no  ENT Surgery: no Personal or FHx of bleeding dz or anesthesia difficulty: no  AP/AC: no  Tobacco: no  PMHx: OSA, T2DM, HTN, GAD  Independent Review of Additional Tests or Records:  Dr. Marylu (02/07/2024): dizziness, with sense of sinus pressure; given astelin , meclizine , zofran  for sinusitis; has not used nasal spray or meclizine ; orthostatic negative; Dx: Imbalance, Sinus pressure; Rx: ref to ENT BMP reviewed: BUN/Cr 19/1.19 03/2024 Audiogram was independently reviewed and interpreted by me and it reveals - A/A tymps; normal downsloping to mild SNHL AU with 100% WRT at 55dB SNHL= Sensorineural hearing loss  03/09/2024 Elsie Lunger: chest congestio nand cough with increased sputum production and change in color. Dx: Bronchitis; Rx: azithromycin ; then seen for wheezing, Rx: prednisone  CT Head/Sinus 06/03/2000 reviewed: no evidence of signifcant MPT on limited exam  PMH/Meds/All/SocHx/FamHx/ROS:   Past Medical History:  Diagnosis Date   Anxiety disorder    Erectile dysfunction    Hyperlipidemia    Hypertension    Obesity, morbid, BMI 50 or higher (HCC)    Poorly controlled type 2 diabetes mellitus (HCC)    Schizophrenia (HCC)    Sleep apnea      Past Surgical History:  Procedure Laterality Date   ANKLE SURGERY     APPENDECTOMY      Family History  Problem Relation Age of Onset   Breast cancer Mother    Alcohol abuse Father    Hypertension Sister    Breast cancer Sister    Hypertension Brother  Diabetes type II Brother    Colon cancer Paternal Uncle        possible but not sure   Colon polyps Neg Hx    Rectal cancer Neg Hx    Stomach cancer Neg Hx      Social Connections: Moderately Isolated (10/20/2023)   Social Connection and Isolation Panel    Frequency of Communication with Friends and Family: More than three times a week    Frequency of Social Gatherings with Friends and Family: More than three times a week    Attends Religious Services: More  than 4 times per year    Active Member of [employer] or Organizations: No    Attends Banker Meetings: Never    Marital Status: Widowed      Current Outpatient Medications:    Accu-Chek Softclix Lancets lancets, USE CHECK GLUCOSE ONCE TO TWICE DAILY, Disp: 100 each, Rfl: 3   albuterol  (VENTOLIN  HFA) 108 (90 Base) MCG/ACT inhaler, Inhale 2 puffs into the lungs every 6 (six) hours as needed for wheezing or shortness of breath., Disp: 8 g, Rfl: 0   amLODipine  (NORVASC ) 10 MG tablet, Take 1 tablet (10 mg total) by mouth at bedtime., Disp: 90 tablet, Rfl: 3   BD PEN NEEDLE NANO 2ND GEN 32G X 4 MM MISC, USE NEEDLE WITH INSULIN  PEN TO INJECT YOUR INSULIN  AS DIRECTED, Disp: 100 each, Rfl: 3   Blood Glucose Monitoring Suppl (ACCU-CHEK GUIDE) w/Device KIT, Use to check blood sugars 1-2 times daily., Disp: 1 kit, Rfl: 0   Chlorphen-PE-Acetaminophen  4-10-325 MG TABS, Take 1 tablet by mouth in the morning, at noon, and at bedtime., Disp: 21 tablet, Rfl: 0   Elastic Bandages & Supports (WRIST SPLINT/ELASTIC RIGHT XL) MISC, 1 Application by Does not apply route daily., Disp: 1 each, Rfl: 0   gabapentin  (NEURONTIN ) 100 MG capsule, Take 1 capsule (100 mg total) by mouth 3 (three) times daily., Disp: 90 capsule, Rfl: 1   glucose blood (ACCU-CHEK GUIDE TEST) test strip, Use to check blood sugar once to twice daily, Disp: 100 each, Rfl: 3   hydrOXYzine  (VISTARIL ) 25 MG capsule, Take 1 capsule (25 mg total) by mouth every 8 (eight) hours as needed for anxiety., Disp: 30 capsule, Rfl: 0   JARDIANCE  10 MG TABS tablet, TAKE 1 TABLET BY MOUTH ONCE DAILY BEFORE BREAKFAST, Disp: 30 tablet, Rfl: 0   LANTUS  SOLOSTAR 100 UNIT/ML Solostar Pen, Inject 25 Units into the skin at bedtime., Disp: 30 mL, Rfl: 1   losartan -hydrochlorothiazide  (HYZAAR) 50-12.5 MG tablet, Take 1 tablet by mouth daily., Disp: 30 tablet, Rfl: 11   meclizine  (ANTIVERT ) 25 MG tablet, Take 1 tablet (25 mg total) by mouth 3 (three) times daily as  needed for dizziness., Disp: 30 tablet, Rfl: 0   omeprazole  (PRILOSEC) 20 MG capsule, Take 1 capsule (20 mg total) by mouth daily before breakfast., Disp: 30 capsule, Rfl: 0   ondansetron  (ZOFRAN -ODT) 4 MG disintegrating tablet, Take 1 tablet (4 mg total) by mouth every 8 (eight) hours as needed for nausea or vomiting., Disp: 20 tablet, Rfl: 0   rosuvastatin  (CRESTOR ) 20 MG tablet, Take 1 tablet (20 mg total) by mouth daily., Disp: 90 tablet, Rfl: 3   Semaglutide , 1 MG/DOSE, 4 MG/3ML SOPN, Inject 1 mg into the skin once a week., Disp: 3 mL, Rfl: 3   azelastine  (ASTELIN ) 0.1 % nasal spray, Place 2 sprays into both nostrils daily. Use in each nostril as directed, Disp: 30 mL, Rfl: 0  fluticasone  (FLONASE ) 50 MCG/ACT nasal spray, Place 2 sprays into both nostrils daily. (Patient not taking: Reported on 03/23/2024), Disp: 16 g, Rfl: 6   methocarbamol  (ROBAXIN ) 500 MG tablet, Take 1 tablet (500 mg total) by mouth 4 (four) times daily. (Patient not taking: Reported on 03/23/2024), Disp: 20 tablet, Rfl: 0   predniSONE  (DELTASONE ) 20 MG tablet, Take 2 tablets (40 mg total) by mouth daily with breakfast. (Patient not taking: Reported on 03/23/2024), Disp: 10 tablet, Rfl: 0   Physical Exam:   BP (!) 152/98 (BP Location: Left Wrist, Patient Position: Sitting, Cuff Size: Large)   Pulse 88   Ht 5' 7 (1.702 m)   Wt (!) 327 lb (148.3 kg)   SpO2 91%   BMI 51.22 kg/m   Salient findings:  CN II-XII intact Bilateral EAC clear and TM intact with well pneumatized middle ear spaces Weber 512: mid Rinne 512: AC > BC b/l  Head shake negative, uses a cane, DH negative bilaterally Anterior rhinoscopy: Septum relatively midline; bilateral inferior turbinates with mild hypertrophy; Nasal endoscopy was indicated to better evaluate the nose and paranasal sinuses, given the patient's history and exam findings, and is detailed below. No lesions of oral cavity/oropharynx No obviously palpable neck  masses/lymphadenopathy/thyromegaly No respiratory distress or stridor  Seprately Identifiable Procedures:  Prior to initiating any procedures, risks/benefits/alternatives were explained to the patient and verbal consent obtained. PROCEDURE: Bilateral Diagnostic Rigid Nasal Endoscopy Pre-procedure diagnosis: Concern for sinusitis Post-procedure diagnosis: same Indication: See pre-procedure diagnosis and physical exam above Complications: None apparent EBL: 0 mL Anesthesia: Lidocaine  4% and topical decongestant was topically sprayed in each nasal cavity  Description of Procedure:  Patient was identified. A rigid 30 degree endoscope was utilized to evaluate the sinonasal cavities, mucosa, sinus ostia and turbinates and septum.  Overall, signs of mucosal inflammation are not noted.  No mucopurulence, polyps, or masses noted.   Right Middle meatus: clear Right SE Recess: clear Left MM: clear Left SE Recess: clear  CPT CODE -- 68768 - Mod 25   Impression & Plans:  John [name] is a 58 y.o. male with:  1. Benign paroxysmal positional vertigo, unspecified laterality   2. Facial pressure   3. Other chronic sinusitis   4. Nasal congestion   5. Hypertrophy of both inferior nasal turbinates   6. Imbalance   7. Primary hypertension   8. Sensorineural hearing loss (SNHL) of both ears    He appears to have two separate descriptions and types of imbalance --- one appears more consistent with BPPV given lasts a few seconds and helps when he does the Epley. This has not occurred in about a month.   The other instance happens multiple episodes per week and associated with lightheadedness, and noted to be more consistent with perhaps a cardiac cause/perhaps HTN(?). He did not take his BP meds today and I advised to take those.   He also complains of chronic sinus pressure and has recently been treated for bronchitis. We discussed utility of medical management but he cannot tolerate steroids  including INCS. Given recent treatment, will get post-treatment sinus CT as it is a recurring problem  Will start with vestibular rehab Encouraged to take his BP meds and follow up with PCP re: his other imbalance. Do not think this is otologic - perhaps uncontrolled HTN? Sinus pressure: will do daily rinses and astelin  BID; CT sinus  F/u 2 months  See below regarding exact medications prescribed this encounter including dosages and route: Meds ordered  this encounter  Medications   azelastine  (ASTELIN ) 0.1 % nasal spray    Sig: Place 2 sprays into both nostrils daily. Use in each nostril as directed    Dispense:  30 mL    Refill:  0      Thank you for allowing me the opportunity to care for your patient. Please do not hesitate to contact me should you have any other questions.  Sincerely, Eldora Blanch, MD Otolaryngologist (ENT), [facility] Phone: [phone] Fax: [phone]  03/23/2024, 2:39 PM   MDM:  Level 4 - 99204 Complexity/Problems addressed: mod - chronic problems Data complexity: mod - independent review of notes, labs, tests - Morbidity: mod  - Drug prescribed or managed: y

## 2024-03-23 NOTE — Patient Instructions (Addendum)
 I have ordered an imaging study for you to complete prior to your next visit. Please call [facility] Scheduling at [phone] to schedule your imaging if you have not received a call within 24 hours. If you are unable to complete your imaging study prior to your next scheduled visit please call our office to let us  know.   We have also sent you to physical therapy for your balance/vertigo to test and treat it. If you do not hear back from them in a week or so, please call us .   I have ordered an imaging study for you to complete prior to your next visit. Please call [facility] Sched[facility] at [phone] to schedule your imaging if you have not received a call within 24 hours. If you are unable to complete your imaging study prior to your next scheduled visit please call our office to let us  know.   Use astelin  spray two sprays each nostril daily Sinus Saline Rinse - use daily; stop 3 days before CT scan

## 2024-03-23 NOTE — Progress Notes (Signed)
  [address], [address] [place], [place] [address] [phone]  Audiological Evaluation    Name: John [name]     DOB:   1966/07/08      MRN:   [id number]                                                                                     Service Date: 03/23/2024     Accompanied by: unaccompanied to the booth   Patient comes today after Dr. Tobie, ENT sent a referral for a hearing evaluation due to concerns with dizziness.   Symptoms Yes Details  Hearing loss  []    Tinnitus  [x]  Some humming or whistling sound in the right ear.  Ear pain/ infections/pressure  [x]  Ear itchiness  Balance problems  [x]  When he lays back the rooms spin and Epley maneuver helped. Today feels dizzy and links it to pressure in his front teeth he started feeling today  Noise exposure history  []    Previous ear surgeries  []    Family history of hearing loss  []    Amplification  []    Other  []      Otoscopy: Right ear: Clear external ear canal and notable landmarks visualized on the tympanic membrane. Left ear:  Clear external ear canal and notable landmarks visualized on the tympanic membrane.  Tympanometry: Right ear: Type A- Normal external ear canal volume with normal middle ear pressure and tympanic membrane compliance. Left ear: Type A- Normal external ear canal volume with normal middle ear pressure and tympanic membrane compliance.  Distortion Product Otoacoustic Emissions: Equipment not available.  Pure tone Audiometry: Right ear- Normal hearing from [phone] Hz, then mild presumably sensorineural hearing loss from 6000 Hz - 8000 Hz. Left ear-  Normal hearing from [phone], then mild presumably sensorineural hearing loss at 8000 Hz .  Speech Audiometry: Right ear- Speech Reception Threshold (SRT) was obtained at 10 dBHL. Left ear-Speech Reception Threshold (SRT) was obtained at 10 dBHL.   Word Recognition Score Tested using NU-6 (recorded) Right ear: 100% was obtained at a  presentation level of 55 dBHL with contralateral masking which is deemed as  excellent. Left ear: 100% was obtained at a presentation level of 55 dBHL with contralateral masking which is deemed as  excellent.   The hearing test results were completed under headphones and results are deemed to be of good reliability. Test technique:  conventional    Impression: There is not a significant difference in pure-tone thresholds between ears., There is not a significant difference in the word recognition score in between ears.    Recommendations: Follow up with ENT as scheduled for today. Return for a hearing evaluation if concerns with hearing changes arise or per MD recommendation.   John [name], AUD

## 2024-03-24 ENCOUNTER — Telehealth (INDEPENDENT_AMBULATORY_CARE_PROVIDER_SITE_OTHER): Payer: Self-pay | Admitting: Otolaryngology

## 2024-03-24 NOTE — Telephone Encounter (Signed)
 LVM for patient to call back regarding appointment with Dr. Tobie on 11/13.  Needs to have an Audio before.

## 2024-03-27 ENCOUNTER — Telehealth: Admitting: Physician Assistant

## 2024-03-27 DIAGNOSIS — R369 Urethral discharge, unspecified: Secondary | ICD-10-CM

## 2024-03-27 DIAGNOSIS — R3989 Other symptoms and signs involving the genitourinary system: Secondary | ICD-10-CM | POA: Diagnosis not present

## 2024-03-27 DIAGNOSIS — M5441 Lumbago with sciatica, right side: Secondary | ICD-10-CM

## 2024-03-27 DIAGNOSIS — R350 Frequency of micturition: Secondary | ICD-10-CM

## 2024-03-27 MED ORDER — CEPHALEXIN 500 MG PO CAPS: 500.0000 mg | ORAL_CAPSULE | Freq: Two times a day (BID) | ORAL | 0 refills | Status: AC

## 2024-03-27 NOTE — Patient Instructions (Signed)
 John [name], thank you for joining John CHRISTELLA Dickinson, PA-C for today's virtual visit.  While this provider is not your primary care provider (PCP), if your PCP is located in our provider database this encounter information will be shared with them immediately following your visit.   A [facility] MyChart account gives you access to today's visit and all your visits, tests, and labs performed at [facility]  click here if you don't have a [facility] MyChart account or go to [email or web address]  Consent: (Patient) John [name] provided verbal consent for this virtual visit at the beginning of the encounter.  Current Medications:  Current Outpatient Medications:    cephALEXin  (KEFLEX ) 500 MG capsule, Take 1 capsule (500 mg total) by mouth 2 (two) times daily., Disp: 14 capsule, Rfl: 0   Accu-Chek Softclix Lancets lancets, USE CHECK GLUCOSE ONCE TO TWICE DAILY, Disp: 100 each, Rfl: 3   albuterol  (VENTOLIN  HFA) 108 (90 Base) MCG/ACT inhaler, Inhale 2 puffs into the lungs every 6 (six) hours as needed for wheezing or shortness of breath., Disp: 8 g, Rfl: 0   amLODipine  (NORVASC ) 10 MG tablet, Take 1 tablet (10 mg total) by mouth at bedtime., Disp: 90 tablet, Rfl: 3   azelastine  (ASTELIN ) 0.1 % nasal spray, Place 2 sprays into both nostrils daily. Use in each nostril as directed, Disp: 30 mL, Rfl: 0   BD PEN NEEDLE NANO 2ND GEN 32G X 4 MM MISC, USE NEEDLE WITH INSULIN  PEN TO INJECT YOUR INSULIN  AS DIRECTED, Disp: 100 each, Rfl: 3   Blood Glucose Monitoring Suppl (ACCU-CHEK GUIDE) w/Device KIT, Use to check blood sugars 1-2 times daily., Disp: 1 kit, Rfl: 0   Chlorphen-PE-Acetaminophen  4-10-325 MG TABS, Take 1 tablet by mouth in the morning, at noon, and at bedtime., Disp: 21 tablet, Rfl: 0   Elastic Bandages & Supports (WRIST SPLINT/ELASTIC RIGHT XL) MISC, 1 Application by Does not apply route daily., Disp: 1 each, Rfl: 0   fluticasone  (FLONASE ) 50 MCG/ACT nasal spray, Place 2  sprays into both nostrils daily. (Patient not taking: Reported on 03/23/2024), Disp: 16 g, Rfl: 6   gabapentin  (NEURONTIN ) 100 MG capsule, Take 1 capsule (100 mg total) by mouth 3 (three) times daily., Disp: 90 capsule, Rfl: 1   glucose blood (ACCU-CHEK GUIDE TEST) test strip, Use to check blood sugar once to twice daily, Disp: 100 each, Rfl: 3   hydrOXYzine  (VISTARIL ) 25 MG capsule, Take 1 capsule (25 mg total) by mouth every 8 (eight) hours as needed for anxiety., Disp: 30 capsule, Rfl: 0   JARDIANCE  10 MG TABS tablet, TAKE 1 TABLET BY MOUTH ONCE DAILY BEFORE BREAKFAST, Disp: 30 tablet, Rfl: 0   LANTUS  SOLOSTAR 100 UNIT/ML Solostar Pen, Inject 25 Units into the skin at bedtime., Disp: 30 mL, Rfl: 1   losartan -hydrochlorothiazide  (HYZAAR) 50-12.5 MG tablet, Take 1 tablet by mouth daily., Disp: 30 tablet, Rfl: 11   meclizine  (ANTIVERT ) 25 MG tablet, Take 1 tablet (25 mg total) by mouth 3 (three) times daily as needed for dizziness., Disp: 30 tablet, Rfl: 0   methocarbamol  (ROBAXIN ) 500 MG tablet, Take 1 tablet (500 mg total) by mouth 4 (four) times daily. (Patient not taking: Reported on 03/23/2024), Disp: 20 tablet, Rfl: 0   omeprazole  (PRILOSEC) 20 MG capsule, Take 1 capsule (20 mg total) by mouth daily before breakfast., Disp: 30 capsule, Rfl: 0   ondansetron  (ZOFRAN -ODT) 4 MG disintegrating tablet, Take 1 tablet (4 mg total) by mouth every 8 (eight) hours  as needed for nausea or vomiting., Disp: 20 tablet, Rfl: 0   predniSONE  (DELTASONE ) 20 MG tablet, Take 2 tablets (40 mg total) by mouth daily with breakfast. (Patient not taking: Reported on 03/23/2024), Disp: 10 tablet, Rfl: 0   rosuvastatin  (CRESTOR ) 20 MG tablet, Take 1 tablet (20 mg total) by mouth daily., Disp: 90 tablet, Rfl: 3   Semaglutide , 1 MG/DOSE, 4 MG/3ML SOPN, Inject 1 mg into the skin once a week., Disp: 3 mL, Rfl: 3   Medications ordered in this encounter:  Meds ordered this encounter  Medications   cephALEXin  (KEFLEX ) 500 MG  capsule    Sig: Take 1 capsule (500 mg total) by mouth 2 (two) times daily.    Dispense:  14 capsule    Refill:  0    Supervising Provider:   BLAISE ALEENE KIDD [[id number]]     *If you need refills on other medications prior to your next appointment, please contact your pharmacy*  Follow-Up: Call back or seek an in-person evaluation if the symptoms worsen or if the condition fails to improve as anticipated.  [facility] [phone]  Other Instructions  Urinary Tract Infection, Male A urinary tract infection (UTI) is an infection in your urinary tract. The urinary tract is made up of the organs that make, store, and get rid of pee (urine) in your body. These organs include: The kidneys. The ureters. The bladder. The urethra. What are the causes? Most UTIs are caused by germs called bacteria. They may be in or near your genitals. These germs grow and cause swelling in your urinary tract. What increases the risk? You're more likely to get a UTI if: You have a soft tube called a catheter that drains your pee. You can't control when you pee or poop. You have trouble peeing because of: A prostate that's bigger than normal. A kidney stone. A urinary blockage. A nerve condition that affects your bladder. Not getting enough to drink. You're sexually active. You're an older adult. You're also more likely to get a UTI if you have other health problems, such as: Diabetes. A weak immune system. Your immune system is your body's defense system. Sickle cell disease. Injury of the spine. What are the signs or symptoms? Symptoms may include: Needing to pee right away. Peeing small amounts often. Trouble getting the stream started. Pain or burning when you pee. Blood in your pee. Pee that smells bad or odd. Pain in your belly or lower back. You may also: Feel confused. This may be the first symptom in older adults. Vomit. Not feel hungry. Feel tired or easily  annoyed. Have a fever or chills. How is this diagnosed? A UTI is diagnosed based on your medical history and an exam. You may also have other tests. These may include: Pee tests. Blood tests. Tests for sexually transmitted infections (STIs). If you've had more than one UTI, you may need to have imaging studies done to find out why you keep getting them. How is this treated? A UTI can be treated by: Taking antibiotics or other medicines. Drinking enough fluid to keep your pee pale yellow. In rare cases, a UTI can cause a very bad condition called sepsis. Sepsis may be treated in the hospital. Follow these instructions at home: Medicines Take your medicines only as told by your health care provider. If you were given antibiotics, take them as told by your provider. Do not stop taking them even if you start to feel better. General  instructions Make sure you: Pee often and fully. Do not hold your pee for a long time. Pee after you have sex. Contact a health care provider if: Your symptoms don't get better after 1-2 days of taking antibiotics. Your symptoms go away and then come back. You have a fever or chills. You vomit or feel like you may vomit. Get help right away if: You have very bad pain in your back or lower belly. You faint. This information is not intended to replace advice given to you by your health care provider. Make sure you discuss any questions you have with your health care provider. Document Revised: 06/09/2023 Document Reviewed: 10/02/2022 Elsevier Patient Education  [employer].   If you have been instructed to have an in-person evaluation today at a local Urgent Care facility, please use the link below. It will take you to a list of all of our available [facility], including address, phone number and hours of operation. Please do not delay care.  [facility]  If you or a family member do not have a primary care provider, use the  link below to schedule a visit and establish care. When you choose a [facility] primary care physician or advanced practice provider, you gain a long-term partner in health. Find a Primary Care Provider  Learn more about [facility] in-office and virtual care options: [facility] - Get Care Now

## 2024-03-27 NOTE — Progress Notes (Signed)
 Virtual Visit Consent   BHARATH BERNSTEIN, you are scheduled for a virtual visit with a [facility] provider today. Just as with appointments in the office, your consent must be obtained to participate. Your consent will be active for this visit and any virtual visit you may have with one of our providers in the next 365 days. If you have a MyChart account, a copy of this consent can be sent to you electronically.  As this is a virtual visit, video technology does not allow for your provider to perform a traditional examination. This may limit your provider's ability to fully assess your condition. If your provider identifies any concerns that need to be evaluated in person or the need to arrange testing (such as labs, EKG, etc.), we will make arrangements to do so. Although advances in technology are sophisticated, we cannot ensure that it will always work on either your end or our end. If the connection with a video visit is poor, the visit may have to be switched to a telephone visit. With either a video or telephone visit, we are not always able to ensure that we have a secure connection.  By engaging in this virtual visit, you consent to the provision of healthcare and authorize for your insurance to be billed (if applicable) for the services provided during this visit. Depending on your insurance coverage, you may receive a charge related to this service.  I need to obtain your verbal consent now. Are you willing to proceed with your visit today? John [name] has provided verbal consent on 03/27/2024 for a virtual visit (video or telephone). John CHRISTELLA Dickinson, PA-C  Date: 03/27/2024 2:41 PM   Virtual Visit via Video Note   I, John [name], connected with  John [name]  ([id number], Jan 01, 1966) on 03/27/24 at  2:00 PM EDT by a video-enabled telemedicine application and verified that I am speaking with the correct person using two identifiers.  Location: Patient: Virtual Visit Location  Patient: Home Provider: Virtual Visit Location Provider: Home Office   I discussed the limitations of evaluation and management by telemedicine and the availability of in person appointments. The patient expressed understanding and agreed to proceed.    History of Present Illness: John [name] is a 58 y.o. who identifies as a male who was assigned male at birth, and is being seen today for dysuria and right side back pain.  HPI: Back Pain This is a recurrent problem. The current episode started in the past 7 days (Seen in person at [facility] on 03/20/24 for similar issue and given a Toradol  IM with some relief). The problem occurs constantly. The problem has been gradually worsening since onset. The pain is present in the lumbar spine (right side only). The quality of the pain is described as burning and aching. The pain radiates to the right thigh (right groin area also). The pain is moderate. The pain is The same all the time. Associated symptoms include dysuria and leg pain. Pertinent negatives include no bladder incontinence, bowel incontinence, paresis, paresthesias, perianal numbness, tingling or weakness. (Urine frequency, penile discharge) Risk factors include obesity, sedentary lifestyle and lack of exercise. He has tried analgesics (Toradol  IM on 03/20/24) for the symptoms. The treatment provided no relief.  Patient is not sexually active, has not been in 15-20 years reported by patient.    Problems:  Patient Active Problem List   Diagnosis Date Noted   Dizziness 02/08/2024   Shoulder pain, bilateral 01/11/2023  BPPV (benign paroxysmal positional vertigo) 01/11/2023   Left cervical radiculopathy 09/29/2022   Healthcare maintenance 12/03/2021   Erectile dysfunction 02/28/2020   Hyperlipidemia associated with type 2 diabetes mellitus (HCC) 10/18/2019   Type 2 diabetes mellitus with unspecified complications (HCC) 07/26/2019   Schizophrenia (HCC) 07/25/2019   Anxiety disorder 07/25/2019    OSA (obstructive sleep apnea) 08/17/2013   Morbid obesity (HCC) 08/17/2013   Hypertension 08/17/2013    Allergies:  Allergies  Allergen Reactions   Azithromycin  Itching   Medications:  Current Outpatient Medications:    cephALEXin  (KEFLEX ) 500 MG capsule, Take 1 capsule (500 mg total) by mouth 2 (two) times daily., Disp: 14 capsule, Rfl: 0   Accu-Chek Softclix Lancets lancets, USE CHECK GLUCOSE ONCE TO TWICE DAILY, Disp: 100 each, Rfl: 3   albuterol  (VENTOLIN  HFA) 108 (90 Base) MCG/ACT inhaler, Inhale 2 puffs into the lungs every 6 (six) hours as needed for wheezing or shortness of breath., Disp: 8 g, Rfl: 0   amLODipine  (NORVASC ) 10 MG tablet, Take 1 tablet (10 mg total) by mouth at bedtime., Disp: 90 tablet, Rfl: 3   azelastine  (ASTELIN ) 0.1 % nasal spray, Place 2 sprays into both nostrils daily. Use in each nostril as directed, Disp: 30 mL, Rfl: 0   BD PEN NEEDLE NANO 2ND GEN 32G X 4 MM MISC, USE NEEDLE WITH INSULIN  PEN TO INJECT YOUR INSULIN  AS DIRECTED, Disp: 100 each, Rfl: 3   Blood Glucose Monitoring Suppl (ACCU-CHEK GUIDE) w/Device KIT, Use to check blood sugars 1-2 times daily., Disp: 1 kit, Rfl: 0   Chlorphen-PE-Acetaminophen  4-10-325 MG TABS, Take 1 tablet by mouth in the morning, at noon, and at bedtime., Disp: 21 tablet, Rfl: 0   Elastic Bandages & Supports (WRIST SPLINT/ELASTIC RIGHT XL) MISC, 1 Application by Does not apply route daily., Disp: 1 each, Rfl: 0   fluticasone  (FLONASE ) 50 MCG/ACT nasal spray, Place 2 sprays into both nostrils daily. (Patient not taking: Reported on 03/23/2024), Disp: 16 g, Rfl: 6   gabapentin  (NEURONTIN ) 100 MG capsule, Take 1 capsule (100 mg total) by mouth 3 (three) times daily., Disp: 90 capsule, Rfl: 1   glucose blood (ACCU-CHEK GUIDE TEST) test strip, Use to check blood sugar once to twice daily, Disp: 100 each, Rfl: 3   hydrOXYzine  (VISTARIL ) 25 MG capsule, Take 1 capsule (25 mg total) by mouth every 8 (eight) hours as needed for anxiety.,  Disp: 30 capsule, Rfl: 0   JARDIANCE  10 MG TABS tablet, TAKE 1 TABLET BY MOUTH ONCE DAILY BEFORE BREAKFAST, Disp: 30 tablet, Rfl: 0   LANTUS  SOLOSTAR 100 UNIT/ML Solostar Pen, Inject 25 Units into the skin at bedtime., Disp: 30 mL, Rfl: 1   losartan -hydrochlorothiazide  (HYZAAR) 50-12.5 MG tablet, Take 1 tablet by mouth daily., Disp: 30 tablet, Rfl: 11   meclizine  (ANTIVERT ) 25 MG tablet, Take 1 tablet (25 mg total) by mouth 3 (three) times daily as needed for dizziness., Disp: 30 tablet, Rfl: 0   methocarbamol  (ROBAXIN ) 500 MG tablet, Take 1 tablet (500 mg total) by mouth 4 (four) times daily. (Patient not taking: Reported on 03/23/2024), Disp: 20 tablet, Rfl: 0   omeprazole  (PRILOSEC) 20 MG capsule, Take 1 capsule (20 mg total) by mouth daily before breakfast., Disp: 30 capsule, Rfl: 0   ondansetron  (ZOFRAN -ODT) 4 MG disintegrating tablet, Take 1 tablet (4 mg total) by mouth every 8 (eight) hours as needed for nausea or vomiting., Disp: 20 tablet, Rfl: 0   predniSONE  (DELTASONE ) 20 MG tablet, Take 2 tablets (  40 mg total) by mouth daily with breakfast. (Patient not taking: Reported on 03/23/2024), Disp: 10 tablet, Rfl: 0   rosuvastatin  (CRESTOR ) 20 MG tablet, Take 1 tablet (20 mg total) by mouth daily., Disp: 90 tablet, Rfl: 3   Semaglutide , 1 MG/DOSE, 4 MG/3ML SOPN, Inject 1 mg into the skin once a week., Disp: 3 mL, Rfl: 3  Observations/Objective: Patient is well-developed, well-nourished in no acute distress.  Resting comfortably at home.  Head is normocephalic, atraumatic.  No labored breathing.  Speech is clear and coherent with logical content.  Patient is alert and oriented at baseline.    Assessment and Plan: 1. Suspected UTI (Primary) - cephALEXin  (KEFLEX ) 500 MG capsule; Take 1 capsule (500 mg total) by mouth 2 (two) times daily.  Dispense: 14 capsule; Refill: 0  2. Acute right-sided low back pain with right-sided sciatica  3. Urine frequency  4. Penile discharge  - Tried  multiple times to convince patient to be seen in person as his symptoms could be consistent with UTI, Urethritis, Prostatitis, Kidney stone; he also voiced concern for gallstones - Patient politely refused to be seen as he had just been seen and requested multiple times to have Keflex  sent in first since he reports a history of these symptoms and successful treatment with Keflex  in the past - Provider eventually agreed to send Keflex  in since he had been seen in person on [facility] at [facility] for same back pain and also had been seen in person on 03/23/24 with ENT - Strict precautions to be seen in person if symptoms do not improve in 48-72 hours, patient agrees - Also, advised patient that this may not be treated without an in person evaluation in the future, he voiced understanding - Seek in person evaluation if worsening or fails to improve  Follow Up Instructions: I discussed the assessment and treatment plan with the patient. The patient was provided an opportunity to ask questions and all were answered. The patient agreed with the plan and demonstrated an understanding of the instructions.  A copy of instructions were sent to the patient via MyChart unless otherwise noted below.    The patient was advised to call back or seek an in-person evaluation if the symptoms worsen or if the condition fails to improve as anticipated.    John CHRISTELLA Dickinson, PA-C

## 2024-03-28 ENCOUNTER — Ambulatory Visit (HOSPITAL_COMMUNITY)

## 2024-03-29 ENCOUNTER — Telehealth: Payer: Self-pay

## 2024-03-29 NOTE — Telephone Encounter (Signed)
 I spoke with a patient today, stating he would like the doctor to write a letter for [employer]. Pt states he needs this letter by today.

## 2024-03-29 NOTE — Telephone Encounter (Signed)
 Copied from CRM [id number]. Topic: Clinical - Medical Advice >> Mar 28, 2024 11:28 AM Merlynn LABOR wrote: Reason for CRM: Patient called in requesting for letter for [facility] Duty due to not being able to sit for long periods of time. Please contact patient to advise if this can be done for him. Patient is scheduled on 04/04/24 for court. Patient can be reached at [phone].

## 2024-03-30 ENCOUNTER — Ambulatory Visit (INDEPENDENT_AMBULATORY_CARE_PROVIDER_SITE_OTHER): Admitting: Student

## 2024-03-30 DIAGNOSIS — Z0279 Encounter for issue of other medical certificate: Secondary | ICD-10-CM

## 2024-03-30 DIAGNOSIS — F419 Anxiety disorder, unspecified: Secondary | ICD-10-CM

## 2024-03-30 NOTE — Assessment & Plan Note (Signed)
 Patient was requesting a [employer] excuse letter for 04/04/2024 for the medical exemption reason of anxiety and schizophrenia. Patient has not been seen in our clinic for anxiety or schizophrenia in the past year. I explained to the patient that I will not write a jury duty letter excuse for chronic issues that we have not seen or addressed in our a year. Patient was told that we will gladly make an appointment to address these complaints in person, but that it is not honest or in good judgement to write a jury excuse letter for the medical conditions listed above. Plan: -Front desk was asked to schedule patient an appointment to address his chronic issues.

## 2024-03-30 NOTE — Progress Notes (Signed)
  [facility] Internal Medicine Residency Telephone Encounter Continuity Care Appointment  HPI:  This telephone encounter was created for [name] on 03/30/2024 for the following purpose/cc  to discuss jury duty letter excuse.   Past Medical History:  Past Medical History:  Diagnosis Date   Anxiety disorder    Erectile dysfunction    Hyperlipidemia    Hypertension    Obesity, morbid, BMI 50 or higher (HCC)    Poorly controlled type 2 diabetes mellitus (HCC)    Schizophrenia (HCC)    Sleep apnea      Assessment / Plan / Recommendations:  Patient was requesting a Jury excuse letter for 04/04/2024 for the medical exemption reason of anxiety and schizophrenia. Patient has not been seen in our clinic for anxiety or schizophrenia in the past year. I explained to the patient that I will not write a jury duty letter excuse for chronic issues that we have not seen or addressed in our a year. Patient was told that we will gladly make an appointment to address these complaints in person, but that it is not honest or in good judgement to write a jury excuse letter for the medical conditions listed above. Plan: -Front desk was asked to schedule patient an appointment to address his chronic issues.    As always, pt is advised that if symptoms worsen or new symptoms arise, they should go to an urgent care facility or to to ER for further evaluation.   Consent and Medical Decision Making:  Patient discussed with Dr. Shawn This is a telephone encounter between [name] and Damien Lease on 03/30/2024 for discussion of Jury exemption. The visit was conducted with the patient located at his mother's house in the state [place] and Damien Lease at [facility]. The patient's identity was confirmed using their DOB and current address. The patient has consented to being evaluated through a telephone encounter and understands the associated risks (an examination cannot be done and the patient may need to come in  for an appointment) / benefits (allows the patient to remain at home, decreasing exposure to coronavirus). I personally spent 8 minutes on medical discussion.

## 2024-03-31 ENCOUNTER — Ambulatory Visit (HOSPITAL_BASED_OUTPATIENT_CLINIC_OR_DEPARTMENT_OTHER): Payer: Self-pay | Admitting: Primary Care

## 2024-03-31 DIAGNOSIS — G4733 Obstructive sleep apnea (adult) (pediatric): Secondary | ICD-10-CM

## 2024-03-31 NOTE — Telephone Encounter (Signed)
 Spoke with pt and informed him of Beths recommendations - I will place order for mask fitting  Pt will need OV scheduled

## 2024-03-31 NOTE — Telephone Encounter (Signed)
 Patient John [name], just wanted to let someone know that he will be coming Monday to pick up his jury duty papers  Please advise

## 2024-03-31 NOTE — Telephone Encounter (Signed)
 Please advise on spot on nose from CPAP

## 2024-03-31 NOTE — Telephone Encounter (Signed)
 I would stop hydrocortisone cream as this is a steriod and can thin the skin. I would put an order in for mask fitting with DME but likely needs OV in person to assess skin as this can't be done telephonically

## 2024-03-31 NOTE — Progress Notes (Signed)
 Internal Medicine Clinic Attending  Case discussed with the resident at the time of the visit.  We reviewed the resident's history and exam and pertinent patient test results.  I agree with the assessment, diagnosis, and plan of care documented in the resident's note.

## 2024-03-31 NOTE — Telephone Encounter (Addendum)
 FYI Only or Action Required?: Action required by provider: clinical question for provider.  Patient is followed in Pulmonology for sleep apnea, last seen on 08/24/2023 by Hope Almarie ORN, NP.  Called Nurse Triage reporting Bleeding/Bruising.  Symptoms began about a month ago.  Interventions attempted: OTC medications: hydrocortisone cream.  Symptoms are: stable.  Triage Disposition: Call Specialist When Office Open (overriding See PCP When Office is Open (Within 3 Days))  Patient/caregiver understands and will follow disposition?: Yes                             Copied from CRM [id number]. Topic: Clinical - Pink Word Triage >> Mar 31, 2024 12:19 PM Whitney O wrote: Reason for Triage:  patient has a black spot on the bridge of my nose that's sore from the cpap machine , [phone]     ----------------------------------------------------------------------- From previous Reason for Contact - Red Word Triage: Red Word that prompted transfer to Nurse Triage: patient has a black spot on the bridge of my nose that's sore from the cpap machine Reason for Disposition  [1] After 10 days AND [2] bruise not fading  Answer Assessment - Initial Assessment Questions 1. APPEARANCE of BRUISE: Describe the bruise.      States area started out red and is now black/bruised looking, spot is flat 2. SIZE: How large is the bruise?      Size of a dime/penny  3. NUMBER: How many bruises are there?      1 4. LOCATION: Where is the bruise located?      Bridge of nose where CPAP mask contacts 5. ONSET: How long ago did the bruise occur?      A month ago 6. CAUSE: What do you think caused the bruise?     CPAP mask 7. MEDICAL HISTORY: Do you have any medical problems that can cause easy bruising or bleeding? (e.g., leukemia, liver disease, recent chemotherapy)     Denies 8. MEDICINES: Do you take any medicines which thin the blood such as: aspirin, apixaban,  heparin, ibuprofen  (NSAIDS), Plavix, or Coumadin?     Denies 9. OTHER SYMPTOMS: Do you have any other symptoms?  (e.g., weakness, dizziness, pain, fever, nosebleed, blood in urine/stool)     Bruise is painful to the touch, I used to get dizzy, denies fever, denies nosebleeds    Patient stated he noticed bruising on the bridge of his nose about a month ago. Patient stated the area is sore to the touch. Patient wears a CPAP at night. Patient has been applying hydrocortisone cream with no relief. Patient is seeking provider's recommendation and inquired if there is any additional medication he can use for the bruising. No availability with Almarie Hope, NP until 10/31. Please advise.  Protocols used: [employer]

## 2024-04-03 ENCOUNTER — Other Ambulatory Visit: Payer: Self-pay | Admitting: *Deleted

## 2024-04-03 MED ORDER — EMPAGLIFLOZIN 10 MG PO TABS: 10.0000 mg | ORAL_TABLET | Freq: Every day | ORAL | 0 refills | Status: AC

## 2024-04-03 NOTE — Telephone Encounter (Signed)
 Called PT no answer left VM. 04/03/24

## 2024-04-04 NOTE — Telephone Encounter (Signed)
 2nd attempt Called PT no answer left VM

## 2024-04-05 ENCOUNTER — Telehealth: Payer: Self-pay | Admitting: Primary Care

## 2024-04-05 NOTE — Telephone Encounter (Signed)
 3rd attempt called PT left VM.

## 2024-04-05 NOTE — Telephone Encounter (Signed)
 Copied from CRM #[id number]. Topic: Appointments - Scheduling Inquiry for Clinic >> Apr 05, 2024 10:04 AM John [name] wrote: Reason for CRM: Pt returning call to schedule his mask fitting he will be available once someone calls back

## 2024-04-06 ENCOUNTER — Telehealth: Payer: Self-pay | Admitting: Primary Care

## 2024-04-06 NOTE — Telephone Encounter (Signed)
 Copied from CRM [id number]. Topic: Appointments - Scheduling Inquiry for Clinic >> Apr 06, 2024  9:04 AM Isabell A wrote: Reason for CRM: Patient returning phone call to get scheduled for a mask fitting - offered next available 05/15/24, patient insist he needs to be seen sooner per the message he was left.  Callback number: [phone]

## 2024-04-10 NOTE — Addendum Note (Signed)
 Addended by: SHAWN SICK on: 04/10/2024 11:02 AM   Modules accepted: Level of Service

## 2024-04-10 NOTE — Telephone Encounter (Signed)
 ATC x1.  Left detailed vm per DPR.  Per the video visit with Beth on 08/24/23, he is due for a follow up in 1 year.  He was calling regarding a mask fitting?  A mask fitting was ordered by [facility] on 03/31/24, however that would be done through the DME, not our office.  Will await his return call.

## 2024-04-19 ENCOUNTER — Encounter: Admitting: Student

## 2024-04-19 NOTE — Progress Notes (Deleted)
 01/10/2024 Type 2 diabetes mellitus with unspecified complications (HCC)      Lab Results  Component Value Date    HGBA1C 6.9 (A) 12/23/2023    HGBA1C 6.2 (A) 08/26/2023    HGBA1C 8.1 (A) 01/11/2023  John [name] has a history of diabetes. Fortunately, his hemoglobin A1c was at goal (6.9) as of two weeks ago. He has intentionally tapered off his antidiabetic medications, reducing his Ozempic  dose from 2 mg to 1 mg, with the goal of discontinuing it entirely in the future if A1c remains at goal. He is currently taking Jardiance  10 mg daily. A microalbumin test will be done today. If results indicate proteinuria, consider increasing the Jardiance  dosage at that time. -  Urine micro albumin - Continue Ozempic  1 mg weekly - Continue Jardiance  10 mg   Hypertension    BP Readings from Last 3 Encounters:  01/10/24 (!) 154/100  12/28/23 136/89  12/28/23 (!) 142/87  John [name] was seen this afternoon for a routine follow-up visit. He has a known history of hypertension, which appears to remain uncontrolled despite current pharmacologic treatment. Given his comorbidities, I am concerned that persistently elevated blood pressure may further increase his risk for cardiovascular events. Today, his in-office blood pressure measured 154/100 mmHg, despite adherence to both amlodipine  10mg  and lisinopril  20 mg. Given this, I believe it is appropriate to initiate a third antihypertensive agent. I will switch his lisinopril  to Hyzaar to incorporate the additional benefit of hydrochlorothiazide . A basic metabolic panel will be obtained today, and he will return next week for repeat labs to monitor for any electrolyte abnormalities following initiation of the new medication.  I also think his obstructive sleep apnea could be contributing to his persistent hypertension.  Discussed benefits of continuing CPAP use and weight loss. - Discontinue lisinopril  - Start Hyzaar 50-12.5 mg daily - Encourage ambulatory blood  pressure check - Repeat BMP 7/7    CC: Routine Follow Up for management of chronic medical conditions after last office visit 02/07/2024  HPI:  John [name] is a 58 y.o. male with pertinent PMH of hypertension, type 2 diabetes, OSA, BPPV, hyperlipidemia, class III obesity, and schizophrenia who presents as above. Please see assessment and plan below for further details.  Medications: Current Outpatient Medications  Medication Instructions   Accu-Chek Softclix Lancets lancets USE CHECK GLUCOSE ONCE TO TWICE DAILY   albuterol  (VENTOLIN  HFA) 108 (90 Base) MCG/ACT inhaler 2 puffs, Inhalation, Every 6 hours PRN   amLODipine  (NORVASC ) 10 mg, Oral, Daily at bedtime   azelastine  (ASTELIN ) 0.1 % nasal spray 2 sprays, Each Nare, Daily, Use in each nostril as directed   BD PEN NEEDLE NANO 2ND GEN 32G X 4 MM MISC USE NEEDLE WITH INSULIN  PEN TO INJECT YOUR INSULIN  AS DIRECTED   Blood Glucose Monitoring Suppl (ACCU-CHEK GUIDE) w/Device KIT Use to check blood sugars 1-2 times daily.   cephALEXin  (KEFLEX ) 500 mg, Oral, 2 times daily   Chlorphen-PE-Acetaminophen  4-10-325 MG TABS 1 tablet, Oral, 3 times daily   Elastic Bandages & Supports (WRIST SPLINT/ELASTIC RIGHT XL) MISC 1 Application, Does not apply, Daily   empagliflozin  (JARDIANCE ) 10 mg, Oral, Daily before breakfast   fluticasone  (FLONASE ) 50 MCG/ACT nasal spray 2 sprays, Each Nare, Daily   gabapentin  (NEURONTIN ) 100 mg, Oral, 3 times daily   glucose blood (ACCU-CHEK GUIDE TEST) test strip Use to check blood sugar once to twice daily   Lantus  SoloStar 25 Units, Subcutaneous, Daily at bedtime   losartan -hydrochlorothiazide  (HYZAAR) 50-12.5 MG  tablet 1 tablet, Oral, Daily   meclizine  (ANTIVERT ) 25 mg, Oral, 3 times daily PRN   methocarbamol  (ROBAXIN ) 500 mg, Oral, 4 times daily   omeprazole  (PRILOSEC) 20 mg, Oral, Daily before breakfast   ondansetron  (ZOFRAN -ODT) 4 mg, Oral, Every 8 hours PRN   predniSONE  (DELTASONE ) 40 mg, Oral, Daily with  breakfast   rosuvastatin  (CRESTOR ) 20 mg, Oral, Daily   Semaglutide  (1 MG/DOSE) 1 mg, Subcutaneous, Weekly     Review of Systems:   Pertinent items noted in HPI and/or A&P.  Physical Exam:  There were no vitals filed for this visit.  Constitutional:***. In no acute distress. HEENT: Normocephalic, atraumatic, Sclera non-icteric, PERRL, EOM intact Cardio:Regular rate and rhythm. 2+ bilateral {PulseLoc:28294} pulses. Pulm:Clear to auscultation bilaterally. Normal work of breathing on room air. Abdomen: Soft, non-tender, non-distended, positive bowel sounds. FDX:Wzhjupcz for extremity edema. Skin:Warm and dry. Neuro:Alert and oriented x3. No focal deficit noted. Psych:Pleasant mood and affect.   Assessment & Plan:   Assessment & Plan   No orders of the defined types were placed in this encounter.    No follow-ups on file.   Patient {GC/GE:3044014::discussed with,seen with} {JGIMTSattending2025/2026:32954}  Fairy Pool, DO [facility] Internal Medicine Resident PGY-3 Clinic Phone: [phone] Please contact the on call pager at [phone] for any urgent or emergent needs.

## 2024-04-27 DIAGNOSIS — G4733 Obstructive sleep apnea (adult) (pediatric): Secondary | ICD-10-CM | POA: Diagnosis not present

## 2024-04-28 DIAGNOSIS — G4733 Obstructive sleep apnea (adult) (pediatric): Secondary | ICD-10-CM | POA: Diagnosis not present

## 2024-05-11 ENCOUNTER — Telehealth: Payer: Self-pay

## 2024-05-11 ENCOUNTER — Other Ambulatory Visit

## 2024-05-11 NOTE — Telephone Encounter (Signed)
 Attempted to contact patient for scheduled appointment for medication management. Left HIPAA compliant message for patient to return my call at their convenience.   John [name], PharmD [facility] [phone]

## 2024-05-11 NOTE — Progress Notes (Deleted)
 05/11/2024 Name: John [name] MRN: [id number] DOB: 30-Aug-1965  No chief complaint on file.   John [name] is a 58 y.o. year old male who presented for a telephone visit.   They were referred to the pharmacist by a quality report for assistance in managing medication adherence.  PMH includes HTN, OSA, T2DM, BMI > 30, schizophrenia, anxiety.   Subjective: Patient was outreached via telephone on 03/16/24 to discuss adherence to rosuvastatin . He also reported his losartan -hydrochlorothiazide  was making him jittery and he had not taken it in several days. Home BP was 129/82 mmHg, HR 105. He was scheduled to see PCP on 04/19/24, but he cancelled this appointment.  Today, patient presents in *** good spirits and presents without *** any assistance. ***Patient is accompanied by ***.    Care Team: Primary Care Provider: King, Olivia, DO ; Next Scheduled Visit: needs to be scheduled   Medication Access/Adherence  Current Pharmacy:  [id number] - [place] ([place]), [place] - [address]  ([place]) [place] [address] Phone: [phone] Fax: [phone]   Patient reports affordability concerns with their medications: {YES/NO:21197} Patient reports access/transportation concerns to their pharmacy: {YES/NO:21197} Patient reports adherence concerns with their medications:  {YES/NO:21197} ***  *** Patient denies adherence with medications, reports missing *** medications *** times per week, on average.   Diabetes:  Current medications: *** Medications tried in the past: ***  Current glucose readings: ***  Date of Download: *** % Time CGM is active: ***% Average Glucose: *** mg/dL Glucose Management Indicator: ***  Glucose Variability: *** (goal <36%) Time in Goal:  - Time in range 70-180: ***% - Time above range: ***% - Time below range: ***% Observed patterns:  Patient {Actions; denies-reports:120008} hypoglycemic s/sx including  ***dizziness, shakiness, sweating. Patient {Actions; denies-reports:120008} hyperglycemic symptoms including ***polyuria, polydipsia, polyphagia, nocturia, neuropathy, blurred vision.  Current meal patterns:  - Breakfast: *** - Lunch *** - Supper *** - Snacks *** - Drinks ***  Current physical activity: ***  Current medication access support: ***  Macrovascular and Microvascular Risk Reduction:  Statin? {DM Statin Pharmacy:33491}; ACEi/ARB? {DM ACEi/ARB pharmacy:33492} Last urinary albumin/creatinine ratio:  Lab Results  Component Value Date   MICRALBCREAT 131 (H) 01/10/2024   Last eye exam:  Lab Results  Component Value Date   HMDIABEYEEXA No Retinopathy 03/21/2020   Last foot exam: No foot exam found Tobacco Use:  Tobacco Use: Low Risk  (03/27/2024)   Patient History    Smoking Tobacco Use: Never    Smokeless Tobacco Use: Never    Passive Exposure: Not on file   Hypertension:  Current medications: *** Medications previously tried:   Patient {HAS/DOES NOT YJCZ:65250} a validated, automated, upper arm home BP cuff Current blood pressure readings readings: ***  Patient {Actions; denies-reports:120008} hypotensive s/sx including ***dizziness, lightheadedness.  Patient {Actions; denies-reports:120008} hypertensive symptoms including ***headache, chest pain, shortness of breath  Current meal patterns: ***  Current physical activity: ***   Hyperlipidemia/ASCVD Risk Reduction  Current lipid lowering medications:  Medications tried in the past:   Antiplatelet regimen:   ASCVD History:  Family History:  Risk Factors:   Current physical activity: ***  Current medication access support: ***  PREVENT Risk Score:  [email or web address] - 10 year risk of CVD: *** - 10 year risk of ASCVD: *** - 10 year risk of HF: ***   Objective:  BP Readings from Last 3 Encounters:  03/23/24  (!) 152/98  03/20/24 (!) 149/93  02/21/24 (!) 141/91  Lab Results  Component Value Date   HGBA1C 6.9 (A) 12/23/2023   HGBA1C 6.2 (A) 08/26/2023   HGBA1C 8.1 (A) 01/11/2023       Latest Ref Rng & Units 01/18/2024    2:18 PM 01/10/2024    2:47 PM 01/11/2023    3:02 PM  BMP  Glucose 70 - 99 mg/dL 842  858  841   BUN 6 - 24 mg/dL 19  11  14    Creatinine 0.76 - 1.27 mg/dL 8.80  9.09  9.20   BUN/Creat Ratio 9 - 20 16  12  18    Sodium 134 - 144 mmol/L 135  135  135   Potassium 3.5 - 5.2 mmol/L 4.2  4.0  4.2   Chloride 96 - 106 mmol/L 98  99  99   CO2 20 - 29 mmol/L 20  18  21    Calcium  8.7 - 10.2 mg/dL 9.3  9.6  9.8     Lab Results  Component Value Date   CHOL 190 01/11/2023   HDL 48 01/11/2023   LDLCALC 129 (H) 01/11/2023   TRIG 73 01/11/2023   CHOLHDL 4.0 01/11/2023    Medications Reviewed Today   Medications were not reviewed in this encounter       Assessment/Plan:   Diabetes: - Currently {CHL Controlled/Uncontrolled:972 810 4715} with most recent A1C of *** {Desc; above/below:16086} goal {CCM A1c HNJOD:78908453}. Medication adherence appears ***. Control is suboptimal due to ***.   - Last UACR *** - Patient denies personal or family history of multiple endocrine neoplasia type 2, medullary thyroid cancer; personal history of pancreatitis or gallbladder disease.*** - Reviewed long term cardiovascular and renal outcomes of uncontrolled blood sugar - Reviewed goal A1c, goal fasting, and goal 2 hour post prandial glucose - Reviewed hypoglycemia management plan and the rule of 15*** - Reviewed dietary modifications including *** utilizing the healthy plate method, limiting portion size of carbohydrate foods, increasing intake of protein and non-starchy vegetables. Counseled patient to stay hydrated with water throughout the day. - Reviewed lifestyle modifications including: aiming for 150 minutes of moderate intensity exercise every week. *** - Recommend to ***  -  Recommend to check glucose twice daily: fasting and 2-hr PPG ***. Counseled patient to bring glucometer or BG log to every appointment. - Next A1C due ***      Hypertension: - Currently {CHL Controlled/Uncontrolled:972 810 4715} with *** BP consistently {Desc; above/below:16086} goal {BP Goal:27557}. Patient {ACTION; IS/IS WNU:78978602} having s/sx of hypo- or hyper-tension. Medication adherence appears {Desc; appropriate/inappropriate:30686}. Control is suboptimal due to ***.  - Reviewed long term cardiovascular and renal outcomes of uncontrolled blood pressure - Reviewed appropriate blood pressure monitoring technique and reviewed goal blood pressure. Recommended to check home blood pressure and heart rate *** once daily and keep a log to bring to upcoming appointments - Recommend to ***     Hyperlipidemia/ASCVD Risk Reduction: - Currently {CHL Controlled/Uncontrolled:972 810 4715} with most recent LDL-C of *** {Desc; above/below:16086} goal {LDL Goals:32982} given ***. *** intensity statin indicated. - Reviewed long term complications of uncontrolled cholesterol - Reviewed lifestyle recommendations to lower LDL-C including regular physical activity, 5-10% weight loss, eating a diet low in saturated fat, and increasing intake of fiber to at least 25 g per day. - Reviewed lifestyle recommendations to lower TG including following a low-carb diet, restricting alcohol intake, and increasing physical activity and exercise - Recommend to ***     Written patient instructions provided. Patient verbalized understanding of treatment plan.   Follow Up Plan:  Pharmacist *** PCP clinic visit in ***   Lorain Baseman, PharmD [facility] [phone]

## 2024-05-12 ENCOUNTER — Telehealth: Payer: Self-pay

## 2024-05-15 ENCOUNTER — Encounter: Payer: Self-pay | Admitting: Radiology

## 2024-05-25 ENCOUNTER — Ambulatory Visit (INDEPENDENT_AMBULATORY_CARE_PROVIDER_SITE_OTHER): Admitting: Otolaryngology

## 2024-05-29 NOTE — Progress Notes (Unsigned)
 05/29/2024 Name: John [name] MRN: [id number] DOB: 06/19/1966  Chief Complaint  Patient presents with   Appointment    John [name] is a 58 y.o. year old male who presented for a telephone visit.   They were referred to the pharmacist by a quality report for assistance in managing medication adherence.  PMH includes HTN, OSA, T2DM, BMI > 30, schizophrenia, anxiety.   Subjective: Patient was outreached via telephone on 03/16/24 to discuss adherence to rosuvastatin . He also reported his losartan -hydrochlorothiazide  was making him jittery and he had not taken it in several days. Home BP was 129/82 mmHg, HR 105. He was scheduled to see PCP on 04/19/24, but he cancelled this appointment.  Today, patient presents in *** good spirits and presents without *** any assistance. ***Patient is accompanied by ***.    Care Team: Primary Care Provider: King, Olivia, DO ; Next Scheduled Visit: needs to be scheduled   Medication Access/Adherence  Current Pharmacy:  [id number] - [place] - [address] [place] ([place]) [place] [address] Phone: [phone] Fax: [phone]   Patient reports affordability concerns with their medications: {YES/NO:21197} Patient reports access/transportation concerns to their pharmacy: {YES/NO:21197} Patient reports adherence concerns with their medications:  {YES/NO:21197} ***  *** Patient denies adherence with medications, reports missing *** medications *** times per week, on average.   Diabetes:  Current medications: *** Medications tried in the past: ***  Current glucose readings: ***  Date of Download: *** % Time CGM is active: ***% Average Glucose: *** mg/dL Glucose Management Indicator: ***  Glucose Variability: *** (goal <36%) Time in Goal:  - Time in range 70-180: ***% - Time above range: ***% - Time below range: ***% Observed patterns:  Patient {Actions; denies-reports:120008}  hypoglycemic s/sx including ***dizziness, shakiness, sweating. Patient {Actions; denies-reports:120008} hyperglycemic symptoms including ***polyuria, polydipsia, polyphagia, nocturia, neuropathy, blurred vision.  Current meal patterns:  - Breakfast: *** - Lunch *** - Supper *** - Snacks *** - Drinks ***  Current physical activity: ***  Current medication access support: ***  Macrovascular and Microvascular Risk Reduction:  Statin? {DM Statin Pharmacy:33491}; ACEi/ARB? {DM ACEi/ARB pharmacy:33492} Last urinary albumin/creatinine ratio:  Lab Results  Component Value Date   MICRALBCREAT 131 (H) 01/10/2024   Last eye exam:  Lab Results  Component Value Date   HMDIABEYEEXA No Retinopathy 03/21/2020   Last foot exam: No foot exam found Tobacco Use:  Tobacco Use: Low Risk  (03/27/2024)   Patient History    Smoking Tobacco Use: Never    Smokeless Tobacco Use: Never    Passive Exposure: Not on file   Hypertension:  Current medications: *** Medications previously tried:   Patient {HAS/DOES NOT YJCZ:65250} a validated, automated, upper arm home BP cuff Current blood pressure readings readings: ***  Patient {Actions; denies-reports:120008} hypotensive s/sx including ***dizziness, lightheadedness.  Patient {Actions; denies-reports:120008} hypertensive symptoms including ***headache, chest pain, shortness of breath  Current meal patterns: ***  Current physical activity: ***   Hyperlipidemia/ASCVD Risk Reduction  Current lipid lowering medications:  Medications tried in the past:   Antiplatelet regimen:   ASCVD History:  Family History:  Risk Factors:   Current physical activity: ***  Current medication access support: ***  PREVENT Risk Score:  [email or web address] - 10 year risk of CVD: *** - 10 year risk of ASCVD: *** - 10 year risk of HF: ***   Objective:  BP Readings from  Last 3 Encounters:  03/23/24 (!) 152/98  03/20/24 (!) 149/93  02/21/24 (!) 141/91    Lab Results  Component Value Date   HGBA1C 6.9 (A) 12/23/2023   HGBA1C 6.2 (A) 08/26/2023   HGBA1C 8.1 (A) 01/11/2023       Latest Ref Rng & Units 01/18/2024    2:18 PM 01/10/2024    2:47 PM 01/11/2023    3:02 PM  BMP  Glucose 70 - 99 mg/dL 842  858  841   BUN 6 - 24 mg/dL 19  11  14    Creatinine 0.76 - 1.27 mg/dL 8.80  9.09  9.20   BUN/Creat Ratio 9 - 20 16  12  18    Sodium 134 - 144 mmol/L 135  135  135   Potassium 3.5 - 5.2 mmol/L 4.2  4.0  4.2   Chloride 96 - 106 mmol/L 98  99  99   CO2 20 - 29 mmol/L 20  18  21    Calcium  8.7 - 10.2 mg/dL 9.3  9.6  9.8     Lab Results  Component Value Date   CHOL 190 01/11/2023   HDL 48 01/11/2023   LDLCALC 129 (H) 01/11/2023   TRIG 73 01/11/2023   CHOLHDL 4.0 01/11/2023    Medications Reviewed Today   Medications were not reviewed in this encounter       Assessment/Plan:   Diabetes: - Currently {CHL Controlled/[phone]} with most recent A1C of *** {Desc; above/below:16086} goal {CCM A1c HNJOD:78908453}. Medication adherence appears ***. Control is suboptimal due to ***.   - Last UACR *** - Patient denies personal or family history of multiple endocrine neoplasia type 2, medullary thyroid cancer; personal history of pancreatitis or gallbladder disease.*** - Reviewed long term cardiovascular and renal outcomes of uncontrolled blood sugar - Reviewed goal A1c, goal fasting, and goal 2 hour post prandial glucose - Reviewed hypoglycemia management plan and the rule of 15*** - Reviewed dietary modifications including *** utilizing the healthy plate method, limiting portion size of carbohydrate foods, increasing intake of protein and non-starchy vegetables. Counseled patient to stay hydrated with water throughout the day. - Reviewed lifestyle modifications including: aiming for 150 minutes of moderate intensity exercise every week.  *** - Recommend to ***  - Recommend to check glucose twice daily: fasting and 2-hr PPG ***. Counseled patient to bring glucometer or BG log to every appointment. - Next A1C due ***      Hypertension: - Currently {CHL Controlled/[phone]} with *** BP consistently {Desc; above/below:16086} goal {BP Goal:27557}. Patient {ACTION; IS/IS WNU:78978602} having s/sx of hypo- or hyper-tension. Medication adherence appears {Desc; appropriate/inappropriate:30686}. Control is suboptimal due to ***.  - Reviewed long term cardiovascular and renal outcomes of uncontrolled blood pressure - Reviewed appropriate blood pressure monitoring technique and reviewed goal blood pressure. Recommended to check home blood pressure and heart rate *** once daily and keep a log to bring to upcoming appointments - Recommend to ***     Hyperlipidemia/ASCVD Risk Reduction: - Currently {CHL Controlled/[phone]} with most recent LDL-C of *** {Desc; above/below:16086} goal {LDL Goals:32982} given ***. *** intensity statin indicated. - Reviewed long term complications of uncontrolled cholesterol - Reviewed lifestyle recommendations to lower LDL-C including regular physical activity, 5-10% weight loss, eating a diet low in saturated fat, and increasing intake of fiber to at least 25 g per day. - Reviewed lifestyle recommendations to lower TG including following a low-carb diet, restricting alcohol intake, and increasing physical activity and exercise - Recommend to ***     Written patient instructions provided. Patient verbalized understanding of treatment  plan.   Follow Up Plan:  Pharmacist *** PCP clinic visit in ***   Lorain Baseman, PharmD [facility] [phone]

## 2024-06-28 ENCOUNTER — Other Ambulatory Visit: Payer: Self-pay

## 2024-06-28 DIAGNOSIS — E118 Type 2 diabetes mellitus with unspecified complications: Secondary | ICD-10-CM

## 2024-06-28 MED ORDER — ACCU-CHEK SOFTCLIX LANCETS MISC: 3 refills | Status: AC

## 2024-07-03 ENCOUNTER — Other Ambulatory Visit: Payer: Self-pay

## 2024-07-03 DIAGNOSIS — E118 Type 2 diabetes mellitus with unspecified complications: Secondary | ICD-10-CM

## 2024-07-03 MED ORDER — ACCU-CHEK GUIDE TEST VI STRP: ORAL_STRIP | 3 refills | Status: AC

## 2024-07-03 NOTE — Telephone Encounter (Signed)
 Medication sent to pharmacy

## 2024-08-03 ENCOUNTER — Telehealth: Payer: Self-pay

## 2024-08-03 NOTE — Telephone Encounter (Signed)
 Copied from CRM #[id number]. Topic: Clinical - Order For Equipment >> Aug 02, 2024 12:39 PM [name] wrote: Reason for CRM: Patient asked when is time to get a new cpap machine, patient had cpap machine for 5 years now. Patient states the cpap machine is running out of water fast. Phone call dropped and call patient back. Patient asked if he's eligible, a new cpap machine order send to [facility]. Please advise and call back [phone].    Tried to reach out to patient he needs to contact [employer] and ask if he is eligible for a new one and keep his next office appointment so they have the updated notes that will be required

## 2024-08-23 ENCOUNTER — Telehealth: Payer: 59 | Admitting: Primary Care
